# Patient Record
Sex: Female | Born: 1995 | Race: Black or African American | Hispanic: No | Marital: Single | Smoking: Former smoker
Health system: Southern US, Community
[De-identification: ages and names within clinical notes are randomized; demographics above are authoritative.]

## PROBLEM LIST (undated history)

## (undated) ENCOUNTER — Inpatient Hospital Stay (HOSPITAL_COMMUNITY): Payer: Self-pay

## (undated) DIAGNOSIS — N390 Urinary tract infection, site not specified: Secondary | ICD-10-CM

## (undated) DIAGNOSIS — A749 Chlamydial infection, unspecified: Secondary | ICD-10-CM

## (undated) DIAGNOSIS — R011 Cardiac murmur, unspecified: Secondary | ICD-10-CM

## (undated) DIAGNOSIS — F419 Anxiety disorder, unspecified: Secondary | ICD-10-CM

## (undated) DIAGNOSIS — I10 Essential (primary) hypertension: Secondary | ICD-10-CM

## (undated) DIAGNOSIS — B999 Unspecified infectious disease: Secondary | ICD-10-CM

## (undated) HISTORY — PX: ADENOIDECTOMY: SUR15

## (undated) HISTORY — DX: Cardiac murmur, unspecified: R01.1

## (undated) HISTORY — PX: TONSILLECTOMY: SUR1361

---
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
## 2015-01-27 ENCOUNTER — Ambulatory Visit (INDEPENDENT_AMBULATORY_CARE_PROVIDER_SITE_OTHER): Payer: BLUE CROSS/BLUE SHIELD | Admitting: Family Medicine

## 2015-01-27 VITALS — BP 114/68 | HR 100 | Temp 99.0°F | Resp 18 | Ht 61.5 in | Wt 153.0 lb

## 2015-01-27 DIAGNOSIS — B084 Enteroviral vesicular stomatitis with exanthem: Secondary | ICD-10-CM

## 2015-01-27 MED ORDER — MAGIC MOUTHWASH W/LIDOCAINE: 10.0000 mL | ORAL | Status: DC | PRN

## 2015-01-27 NOTE — Progress Notes (Signed)
Subjective:    Patient ID: Karen Booth, female    DOB: [birth year], 19 y.o.   MRN: [id number] This chart was scribed for Nilda Simmer, MD by Jolene Provost, Medical Scribe. This patient was seen in Room 8 and the patient's care was started at 1:36 PM.  Chief Complaint  Patient presents with  . Other    son had hands, foot, mouth disease and she's starting to feel the same symptoms    HPI HPI Comments: Karen Booth is a 19 y.o. female who presents to [facility] complaining of sores on her bilateral hands, feet, and throat. Pt also states she has one sore on her foot. Pt states her son recently had hand, foot and mouth disease and she believes she contracted it from him. Pt endorses associated fever and bilateral ear pain. Pt denies diarrhea, nausea, vomiting, or night sweats. Pt is taking tylenol regularly with some relief. Pt is allergic to tylenol and ibuprofen.   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  Tmax 101.5.  Pt's mother also has hand-foot-mouth at this time.  Nephew gave infection to son who passed onto patient and mother.  Pt does not have a PCP.    Review of Systems  Constitutional: Positive for fever and fatigue. Negative for chills and diaphoresis.  HENT: Positive for ear pain and sore throat. Negative for congestion, postnasal drip, rhinorrhea, trouble swallowing and voice change.   Respiratory: Negative for cough and shortness of breath.   Gastrointestinal: Negative for nausea, vomiting, abdominal pain and diarrhea.  Skin: Positive for color change and rash.  Neurological: Positive for headaches.   Past Medical History  Diagnosis Date  . Urinary tract infection   . Heart murmur    Past Medical History  Diagnosis Date  . Urinary tract infection   . Heart murmur    Allergies  Allergen Reactions  . Ibuprofen Anaphylaxis, Hives and Swelling    Oral swelling   History   Social History  . Marital Status: Single    Spouse Name: N/A  . Number of Children: N/A  . Years of  Education: N/A   Occupational History  . Not on file.   Social History Main Topics  . Smoking status: Former Games developer  . Smokeless tobacco: Never Used     Comment: summer 2014  . Alcohol Use: No  . Drug Use: No  . Sexual Activity: Not Currently    Birth Control/ Protection: None   Other Topics Concern  . Not on file   Social History Narrative       Objective:   Physical Exam  Constitutional: She is oriented to person, place, and time. She appears well-developed and well-nourished. No distress.  HENT:  Head: Normocephalic and atraumatic.  Right Ear: Tympanic membrane, external ear and ear canal normal.  Left Ear: Tympanic membrane, external ear and ear canal normal.  Nose: Nose normal. No mucosal edema or rhinorrhea.  Mouth/Throat: Oropharynx is clear and moist and mucous membranes are normal. Oral lesions present. No uvula swelling. No oropharyngeal exudate.    Scattured.   Eyes: Conjunctivae are normal. Pupils are equal, round, and reactive to light.  Neck: Normal range of motion. Neck supple.  Cardiovascular: Normal rate, regular rhythm and normal heart sounds.  Exam reveals no gallop and no friction rub.   No murmur heard. Pulmonary/Chest: Effort normal and breath sounds normal. No respiratory distress. She has no wheezes. She has no rales.  Musculoskeletal: Normal range of motion.  Lymphadenopathy:  She has no cervical adenopathy.  Neurological: She is alert and oriented to person, place, and time. Coordination normal.  Skin: Skin is warm and dry. Rash noted. She is not diaphoretic.  Macular papular rash on bilateral palms, and one lesion on foot.  No vesicles.  Psychiatric: She has a normal mood and affect. Her behavior is normal.  Nursing note and vitals reviewed.      Assessment & Plan:   1. Hand, foot and mouth disease    -new. -Supportive care recommended. -Rx for Magic Mouthwash provided for PRN use. -Continue Tylenol PRN. -Encourage hydration.  RTC  inability to swallow or drink. -OOW note unti 02/01/15.  Meds ordered this encounter  Medications  . Alum & Mag Hydroxide-Simeth (MAGIC MOUTHWASH W/LIDOCAINE) SOLN    Sig: Take 10 mLs by mouth every 2 (two) hours as needed for mouth pain.    Dispense:  360 mL    Refill:  0   I personally performed the services described in this documentation, which was scribed in my presence. The recorded information has been reviewed and considered.   Aviel Davalos Paulita Fujita, M.D. [facility] [address] [place], [place]  [address] [phone] phone [phone] fax

## 2015-01-27 NOTE — Patient Instructions (Signed)

## 2015-01-29 ENCOUNTER — Ambulatory Visit (INDEPENDENT_AMBULATORY_CARE_PROVIDER_SITE_OTHER): Payer: BLUE CROSS/BLUE SHIELD | Admitting: *Deleted

## 2015-01-29 VITALS — Wt 153.0 lb

## 2015-01-29 DIAGNOSIS — Z3042 Encounter for surveillance of injectable contraceptive: Secondary | ICD-10-CM | POA: Diagnosis not present

## 2015-01-29 MED ORDER — MEDROXYPROGESTERONE ACETATE 150 MG/ML IM SUSP
150.0000 mg | Freq: Once | INTRAMUSCULAR | Status: AC
  Administered 2015-01-29: 150 mg via INTRAMUSCULAR

## 2015-04-18 ENCOUNTER — Ambulatory Visit: Payer: Medicaid Other

## 2015-04-19 ENCOUNTER — Ambulatory Visit: Payer: Medicaid Other

## 2015-09-29 ENCOUNTER — Emergency Department (HOSPITAL_COMMUNITY)
Admission: EM | Admit: 2015-09-29 | Discharge: 2015-09-29 | Disposition: A | Discharge status: Home / Self Care | Payer: Medicaid Other | Attending: Emergency Medicine | Admitting: Emergency Medicine

## 2015-09-29 ENCOUNTER — Encounter (HOSPITAL_COMMUNITY): Payer: Self-pay | Admitting: Emergency Medicine

## 2015-09-29 ENCOUNTER — Emergency Department (HOSPITAL_COMMUNITY): Payer: Medicaid Other

## 2015-09-29 DIAGNOSIS — W231XXA Caught, crushed, jammed, or pinched between stationary objects, initial encounter: Secondary | ICD-10-CM | POA: Insufficient documentation

## 2015-09-29 DIAGNOSIS — R011 Cardiac murmur, unspecified: Secondary | ICD-10-CM | POA: Diagnosis not present

## 2015-09-29 DIAGNOSIS — S60011A Contusion of right thumb without damage to nail, initial encounter: Secondary | ICD-10-CM

## 2015-09-29 DIAGNOSIS — Z87440 Personal history of urinary (tract) infections: Secondary | ICD-10-CM | POA: Diagnosis not present

## 2015-09-29 DIAGNOSIS — Y92810 Car as the place of occurrence of the external cause: Secondary | ICD-10-CM | POA: Insufficient documentation

## 2015-09-29 DIAGNOSIS — Y998 Other external cause status: Secondary | ICD-10-CM | POA: Diagnosis not present

## 2015-09-29 DIAGNOSIS — Z87891 Personal history of nicotine dependence: Secondary | ICD-10-CM | POA: Insufficient documentation

## 2015-09-29 DIAGNOSIS — S6991XA Unspecified injury of right wrist, hand and finger(s), initial encounter: Secondary | ICD-10-CM | POA: Diagnosis present

## 2015-09-29 DIAGNOSIS — Y9389 Activity, other specified: Secondary | ICD-10-CM | POA: Diagnosis not present

## 2015-09-29 MED ORDER — ACETAMINOPHEN 325 MG PO TABS
650.0000 mg | ORAL_TABLET | Freq: Once | ORAL | Status: AC
  Administered 2015-09-29: 650 mg via ORAL
  Filled 2015-09-29: qty 2

## 2015-09-29 NOTE — ED Notes (Signed)
Ortho to come apply splint to patient thumb.

## 2015-09-29 NOTE — ED Notes (Signed)
Patient states that she slammed her right thumb into a car door about 30 minutes ago. Swelling noted to her right thumb

## 2015-09-29 NOTE — ED Provider Notes (Signed)
CSN: [id number]     Arrival date & time 09/29/15  2022 History  By signing my name below, I, Emmanuella Mensah, attest that this documentation has been prepared under the direction and in the presence of Elpidio Anis, PA-C. Electronically Signed: Angelene Giovanni, ED Scribe. 09/29/2015. 9:07 PM.    No chief complaint on file.  The history is provided by the patient. No language interpreter was used.   HPI Comments: Karen Booth is a 20 y.o. female who presents to the Emergency Department complaining of gradually worsening constant moderate right thumb pain s/p thumb injury that occurred 30 mins PTA. She reports associated swelling to the right thumb. She explains that she accidentally slammed her right thumb in the car door. No alleviating factors noted. Pt has not taken any medications PTA. She reports an allergy to Ibuprofen. No lacerations, fever, or chills.    Past Medical History  Diagnosis Date  . Urinary tract infection   . Heart murmur    Past Surgical History  Procedure Laterality Date  . Tonsillectomy     Family History  Problem Relation Age of Onset  . Heart disease Mother     murmur, arrythmia  . Hypertension Mother   . Hypertension Father   . Breast cancer Maternal Grandmother     and g-gma  . Breast cancer Paternal Grandmother   . Hearing loss Neg Hx   . Esophageal cancer Maternal Uncle   . Diabetes Paternal Aunt   . Heart murmur Mother   . Arrhythmia Mother   . Stomach cancer Maternal Uncle    Social History  Substance Use Topics  . Smoking status: Former Games developer  . Smokeless tobacco: Never Used     Comment: summer 2014  . Alcohol Use: No   OB History    Gravida Para Term Preterm AB TAB SAB Ectopic Multiple Living   Review of Systems  Constitutional: Negative for fever and chills.  Musculoskeletal: Positive for arthralgias (right thumb).  Skin: Negative for wound.      Allergies  Ibuprofen  Home Medications   Prior to  Admission medications   Medication Sig Start Date End Date Taking? Authorizing Provider  Alum & Mag Hydroxide-Simeth (MAGIC MOUTHWASH W/LIDOCAINE) SOLN Take 10 mLs by mouth every 2 (two) hours as needed for mouth pain. 01/27/15   Ethelda Chick, MD   There were no vitals taken for this visit. Physical Exam  Constitutional: She is oriented to person, place, and time. She appears well-developed and well-nourished.  HENT:  Head: Normocephalic and atraumatic.  Cardiovascular: Normal rate.   Pulmonary/Chest: Effort normal.  Abdominal: She exhibits no distension.  Musculoskeletal:  Right thumb swollen, mildly erythematous. No bony deformity. Moves all joints with discomfort. Nail intact.  Neurological: She is alert and oriented to person, place, and time.  Skin: Skin is warm and dry.  Psychiatric: She has a normal mood and affect.  Nursing note and vitals reviewed.   ED Course  Procedures (including critical care time) DIAGNOSTIC STUDIES: Oxygen Saturation is 100% on RA, normal by my interpretation.    COORDINATION OF CARE: 9:00 PM- Pt advised of plan for treatment and pt agrees. Pt will receive x-ray for further evaluation.   Imaging Review No results found.   Elpidio Anis, PA-C has personally reviewed and evaluated these images and lab results as part of her medical decision-making.  MDM   Final diagnoses:  None  1. Contusion thumb  Uncomplicated thumb injury. No tendon deficits, negative imaging.    I personally performed the services described in this documentation, which was scribed in my presence. The recorded information has been reviewed and is accurate.     Elpidio Anis, PA-C 09/29/15 2141  Raeford Razor, MD 10/04/15 [phone]

## 2015-09-29 NOTE — Discharge Instructions (Signed)
Cryotherapy °Cryotherapy means treatment with cold. Ice or gel packs can be used to reduce both pain and swelling. Ice is the most helpful within the first 24 to 48 hours after an injury or flare-up from overusing a muscle or joint. Sprains, strains, spasms, burning pain, shooting pain, and aches can all be eased with ice. Ice can also be used when recovering from surgery. Ice is effective, has very few side effects, and is safe for most people to use. °PRECAUTIONS  °Ice is not a safe treatment option for people with: °· Raynaud phenomenon. This is a condition affecting small blood vessels in the extremities. Exposure to cold may cause your problems to return. °· Cold hypersensitivity. There are many forms of cold hypersensitivity, including: °· Cold urticaria. Red, itchy hives appear on the skin when the tissues begin to warm after being iced. °· Cold erythema. This is a red, itchy rash caused by exposure to cold. °· Cold hemoglobinuria. Red blood cells break down when the tissues begin to warm after being iced. The hemoglobin that carry oxygen are passed into the urine because they cannot combine with blood proteins fast enough. °· Numbness or altered sensitivity in the area being iced. °If you have any of the following conditions, do not use ice until you have discussed cryotherapy with your caregiver: °· Heart conditions, such as arrhythmia, angina, or chronic heart disease. °· High blood pressure. °· Healing wounds or open skin in the area being iced. °· Current infections. °· Rheumatoid arthritis. °· Poor circulation. °· Diabetes. °Ice slows the blood flow in the region it is applied. This is beneficial when trying to stop inflamed tissues from spreading irritating chemicals to surrounding tissues. However, if you expose your skin to cold temperatures for too long or without the proper protection, you can damage your skin or nerves. Watch for signs of skin damage due to cold. °HOME CARE INSTRUCTIONS °Follow  these tips to use ice and cold packs safely. °· Place a dry or damp towel between the ice and skin. A damp towel will cool the skin more quickly, so you may need to shorten the time that the ice is used. °· For a more rapid response, add gentle compression to the ice. °· Ice for no more than 10 to 20 minutes at a time. The bonier the area you are icing, the less time it will take to get the benefits of ice. °· Check your skin after 5 minutes to make sure there are no signs of a poor response to cold or skin damage. °· Rest 20 minutes or more between uses. °· Once your skin is numb, you can end your treatment. You can test numbness by very lightly touching your skin. The touch should be so light that you do not see the skin dimple from the pressure of your fingertip. When using ice, most people will feel these normal sensations in this order: cold, burning, aching, and numbness. °· Do not use ice on someone who cannot communicate their responses to pain, such as small children or people with dementia. °HOW TO MAKE AN ICE PACK °Ice packs are the most common way to use ice therapy. Other methods include ice massage, ice baths, and cryosprays. Muscle creams that cause a cold, tingly feeling do not offer the same benefits that ice offers and should not be used as a substitute unless recommended by your caregiver. °To make an ice pack, do one of the following: °· Place crushed ice or a   bag of frozen vegetables in a sealable plastic bag. Squeeze out the excess air. Place this bag inside another plastic bag. Slide the bag into a pillowcase or place a damp towel between your skin and the bag. °· Mix 3 parts water with 1 part rubbing alcohol. Freeze the mixture in a sealable plastic bag. When you remove the mixture from the freezer, it will be slushy. Squeeze out the excess air. Place this bag inside another plastic bag. Slide the bag into a pillowcase or place a damp towel between your skin and the bag. °SEEK MEDICAL CARE  IF: °· You develop white spots on your skin. This may give the skin a blotchy (mottled) appearance. °· Your skin turns blue or pale. °· Your skin becomes waxy or hard. °· Your swelling gets worse. °MAKE SURE YOU:  °· Understand these instructions. °· Will watch your condition. °· Will get help right away if you are not doing well or get worse. °  °This information is not intended to replace advice given to you by your health care provider. Make sure you discuss any questions you have with your health care provider. °  °Document Released: 03/24/2011 Document Revised: 08/18/2014 Document Reviewed: 03/24/2011 °Elsevier Interactive Patient Education ©2016 [employer] ° °Contusion °A contusion is a deep bruise. Contusions are the result of a blunt injury to tissues and muscle fibers under the skin. The injury causes bleeding under the skin. The skin overlying the contusion may turn blue, purple, or yellow. Minor injuries will give you a painless contusion, but more severe contusions may stay painful and swollen for a few weeks.  °CAUSES  °This condition is usually caused by a blow, trauma, or direct force to an area of the body. °SYMPTOMS  °Symptoms of this condition include: °· Swelling of the injured area. °· Pain and tenderness in the injured area. °· Discoloration. The area may have redness and then turn blue, purple, or yellow. °DIAGNOSIS  °This condition is diagnosed based on a physical exam and medical history. An X-ray, CT scan, or MRI may be needed to determine if there are any associated injuries, such as broken bones (fractures). °TREATMENT  °Specific treatment for this condition depends on what area of the body was injured. In general, the best treatment for a contusion is resting, icing, applying pressure to (compression), and elevating the injured area. This is often called the RICE strategy. Over-the-counter anti-inflammatory medicines may also be recommended for pain control.  °HOME CARE INSTRUCTIONS    °· Rest the injured area. °· If directed, apply ice to the injured area: °¨ Put ice in a plastic bag. °¨ Place a towel between your skin and the bag. °¨ Leave the ice on for 20 minutes, 2-3 times per day. °· If directed, apply light compression to the injured area using an elastic bandage. Make sure the bandage is not wrapped too tightly. Remove and reapply the bandage as directed by your health care provider. °· If possible, raise (elevate) the injured area above the level of your heart while you are sitting or lying down. °· Take over-the-counter and prescription medicines only as told by your health care provider. °SEEK MEDICAL CARE IF: °· Your symptoms do not improve after several days of treatment. °· Your symptoms get worse. °· You have difficulty moving the injured area. °SEEK IMMEDIATE MEDICAL CARE IF:  °· You have severe pain. °· You have numbness in a hand or foot. °· Your hand or foot turns pale or cold. °  °  This information is not intended to replace advice given to you by your health care provider. Make sure you discuss any questions you have with your health care provider. °  °Document Released: 05/07/2005 Document Revised: 04/18/2015 Document Reviewed: 12/13/2014 °Elsevier Interactive Patient Education ©2016 [employer] Inc. ° °

## 2015-09-29 NOTE — ED Notes (Signed)
Patient d/c'd self care.  F/U reviewed.  Patient verbalized understanding. 

## 2016-09-25 ENCOUNTER — Encounter (HOSPITAL_COMMUNITY): Payer: Self-pay | Admitting: Emergency Medicine

## 2016-09-25 ENCOUNTER — Emergency Department (HOSPITAL_COMMUNITY)
Admission: EM | Admit: 2016-09-25 | Discharge: 2016-09-25 | Disposition: A | Discharge status: Home / Self Care | Payer: Medicaid Other | Attending: Emergency Medicine | Admitting: Emergency Medicine

## 2016-09-25 DIAGNOSIS — I10 Essential (primary) hypertension: Secondary | ICD-10-CM

## 2016-09-25 DIAGNOSIS — Z79899 Other long term (current) drug therapy: Secondary | ICD-10-CM | POA: Insufficient documentation

## 2016-09-25 DIAGNOSIS — Z87891 Personal history of nicotine dependence: Secondary | ICD-10-CM | POA: Diagnosis not present

## 2016-09-25 DIAGNOSIS — Z720 Tobacco use: Secondary | ICD-10-CM

## 2016-09-25 NOTE — ED Triage Notes (Signed)
Pt reports having high blood pressure for past few months but has been ignoring it. Pt checked BP this morning and noticed it was still elevated and was feeling tired. Denies chest pain or SOB. Pt adds on and off headaches.

## 2016-09-25 NOTE — ED Provider Notes (Signed)
[facility]-EMERGENCY DEPT Provider Note   CSN: [id number] Arrival date & time: 09/25/16  1221  By signing my name below, I, Karen Booth Andrew Long, attest that this documentation has been prepared under the direction and in the presence of [address]Alishba [place], VF CorporationPA-C. Electronically Signed: Marnette Booth Andrew Long, Scribe. 09/25/2016. 1:16 PM.  History   Chief Complaint Chief Complaint  Patient presents with  . Hypertension   The history is provided by the patient and medical records. No language interpreter was used.  Hypertension  This is a new problem. The current episode started more than 1 week ago. The problem occurs daily. The problem has not changed since onset.Pertinent negatives include no chest pain, no abdominal pain, no headaches and no shortness of breath. Nothing aggravates the symptoms. Nothing relieves the symptoms. She has tried nothing for the symptoms. The treatment provided no relief.    HPI Comments:  Karen Booth is a 21 y.o. female who presents to the Emergency Department complaining of concerns about her high blood pressure onset several months ago. She notes this has been going on for the past couple of months, she is in CNA classes and everytime they've checked it there it's been in the 130s/90s range; today she felt fatigued and when she checked her blood pressure it was 136/98. She has never been diagnosed with HTN or been seen by a PCP for it, but she has noticed it in class or at work. She has never been prescribed anything before, and did not take anything for relief of her symptoms. Pt notes intermittent headaches and mild visual blurriness in the past, but is not experiencing either of them today. No known aggravating factors. Admits to consuming sodas regularly. +Smoker. Pt denies ongoing vision changes/HA, lightheadedness, fevers, chills, CP, SOB, LE swelling, abd pain, N/V/D/C, hematuria, dysuria, myalgias, arthralgias, numbness, tingling, focal weakness, or any other complaints at this  time. She has a PCP but hasn't gone to them for this issue.     Past Medical History:  Diagnosis Date  . Heart murmur   . Urinary tract infection     Patient Active Problem List   Diagnosis Date Noted  . Vulvar edema 04/09/2014  . SVD (spontaneous vaginal delivery) 04/09/2014  . Active labor 04/06/2014  . Heart palpitations 03/01/2014  . Supervision of normal first teen pregnancy 10/11/2013    Past Surgical History:  Procedure Laterality Date  . TONSILLECTOMY      OB History    Gravida Para Term Preterm AB Living   1 1 1     1    SAB TAB Ectopic Multiple Live Births           1       Home Medications    Prior to Admission medications   Medication Sig Start Date End Date Taking? Authorizing Provider  Alum & Mag Hydroxide-Simeth (MAGIC MOUTHWASH W/LIDOCAINE) SOLN Take 10 mLs by mouth every 2 (two) hours as needed for mouth pain. 01/27/15   Ethelda ChickKristi M Smith, MD    Family History Family History  Problem Relation Age of Onset  . Heart disease Mother     murmur, arrythmia  . Hypertension Mother   . Heart murmur Mother   . Arrhythmia Mother   . Hypertension Father   . Breast cancer Maternal Grandmother     and g-gma  . Breast cancer Paternal Grandmother   . Esophageal cancer Maternal Uncle   . Diabetes Paternal Aunt   . Stomach cancer Maternal Uncle   .  Hearing loss Neg Hx     Social History Social History  Substance Use Topics  . Smoking status: Former Games developer  . Smokeless tobacco: Never Used     Comment: summer 2014  . Alcohol use No     Allergies   Ibuprofen   Review of Systems Review of Systems  Constitutional: Negative for chills and fever.       +HTN  Eyes: Negative for visual disturbance.  Respiratory: Negative for shortness of breath.   Cardiovascular: Negative for chest pain and leg swelling.  Gastrointestinal: Negative for abdominal pain, constipation, diarrhea, nausea and vomiting.  Genitourinary: Negative for dysuria and hematuria.    Musculoskeletal: Negative for arthralgias and myalgias.  Skin: Negative for color change.  Allergic/Immunologic: Negative for immunocompromised state.  Neurological: Negative for weakness, light-headedness, numbness and headaches.  Psychiatric/Behavioral: Negative for confusion.   10 Systems reviewed and are negative for acute change except as noted in the HPI.  Physical Exam Updated Vital Signs BP (!) 130/106 (BP Location: Left Arm)   Pulse 72   Temp 98.9 F (37.2 C) (Oral)   Resp 16   Ht 5' 1.5" (1.562 m)   Wt 179 lb 1.6 oz (81.2 kg)   SpO2 100%   BMI 33.29 kg/m   Physical Exam  Constitutional: She is oriented to person, place, and time. Vital signs are normal. She appears well-developed and well-nourished.  Non-toxic appearance. No distress.  Afebrile, nontoxic, NAD, overweight. BP 130/106  HENT:  Head: Normocephalic and atraumatic.  Mouth/Throat: Oropharynx is clear and moist and mucous membranes are normal.  Eyes: Conjunctivae and EOM are normal. Right eye exhibits no discharge. Left eye exhibits no discharge.  Neck: Normal range of motion. Neck supple.  Cardiovascular: Normal rate, regular rhythm, normal heart sounds and intact distal pulses.  Exam reveals no gallop and no friction rub.   No murmur heard. Pulmonary/Chest: Effort normal and breath sounds normal. No respiratory distress. She has no decreased breath sounds. She has no wheezes. She has no rhonchi. She has no rales.  Abdominal: Soft. Normal appearance and bowel sounds are normal. She exhibits no distension. There is no tenderness. There is no rigidity, no rebound, no guarding, no CVA tenderness, no tenderness at McBurney's point and negative Murphy's sign.  Musculoskeletal: Normal range of motion.  MAE x4 Strength and sensation grossly intact in all extremities Distal pulses intact Gait steady No pedal edema  Neurological: She is alert and oriented to person, place, and time. She has normal strength. No  sensory deficit.  Skin: Skin is warm, dry and intact. No rash noted.  Psychiatric: She has a normal mood and affect.  Nursing note and vitals reviewed.    ED Treatments / Results  DIAGNOSTIC STUDIES:  Oxygen Saturation is 100% on RA, normal by my interpretation.    COORDINATION OF CARE:  1:06 PM Pt advised to stop smoking, cutting out caffeine supplements including soda, and exercising. Discussed treatment plan with pt at bedside and pt agreed to plan. Pt advised to f/u with PCP in 2 or 3 weeks to establish blood pressure baseline.   Labs (all labs ordered are listed, but only abnormal results are displayed) Labs Reviewed - No data to display  EKG  EKG Interpretation None       Radiology No results found.  Procedures Procedures (including critical care time)  Medications Ordered in ED Medications - No data to display   Initial Impression / Assessment and Plan / ED Course  I have  reviewed the triage vital signs and the nursing notes.  Pertinent labs & imaging results that were available during my care of the patient were reviewed by me and considered in my medical decision making (see chart for details).     21 y.o. female here with concern about her HTN, has checked it at home/work a few times and it's been in the 130s/90s range. Pt slightly overweight, and a smoker. Consumes some caffeine products. BP today 130/106, completely asymptomatic at this time. Doubt need for emergent work up. Long discussion had regarding diet/exercise and lifestyle changes, DASH diet, and smoking cessation, and I feel she would likely greatly benefit from this alone; doubt she would need any meds if she really starts to making diet/life changes. Encouraged pt to keep a log of BPs for her PCP's office, and f/up with them in 2-3 weeks for recheck and ongoing management. Would consider trial of 6 months to make diet/life changes before considering medicines, but will defer to PCP's judgement on  this. I explained the diagnosis and have given explicit precautions to return to the ER including for any other new or worsening symptoms. The patient understands and accepts the medical plan as it's been dictated and I have answered their questions. Discharge instructions concerning home care and prescriptions have been given. The patient is STABLE and is discharged to home in good condition.   I personally performed the services described in this documentation, which was scribed in my presence. The recorded information has been reviewed and is accurate.   Final Clinical Impressions(s) / ED Diagnoses   Final diagnoses:  Hypertension, unspecified type  Tobacco user    New Prescriptions New Prescriptions   No medications on file     [address] Deeksha Cotrell, PA-C 09/25/16 1321    Tilden Fossa, MD 09/26/16 [phone]

## 2016-09-25 NOTE — Discharge Instructions (Signed)
STOP SMOKING! Eat a low-salt diet. Avoid caffeine intake. Try to exercise more, walking 30 minutes per day 3x/week is a great place to start. Try to lose weight. Follow up with your primary care doctor in 2-3 weeks to recheck symptoms and for ongoing management of your blood pressure. Try to keep a log of your blood pressure readings if possible, to take to your doctor's visit. Return to the ER for emergent changes or worsening symptoms.

## 2016-11-12 ENCOUNTER — Emergency Department (HOSPITAL_COMMUNITY): Payer: Medicaid Other

## 2016-11-12 ENCOUNTER — Emergency Department (HOSPITAL_COMMUNITY)
Admission: EM | Admit: 2016-11-12 | Discharge: 2016-11-13 | Disposition: A | Discharge status: Home / Self Care | Payer: Medicaid Other | Attending: Emergency Medicine | Admitting: Emergency Medicine

## 2016-11-12 ENCOUNTER — Encounter (HOSPITAL_COMMUNITY): Payer: Self-pay

## 2016-11-12 DIAGNOSIS — N390 Urinary tract infection, site not specified: Secondary | ICD-10-CM | POA: Diagnosis not present

## 2016-11-12 DIAGNOSIS — J111 Influenza due to unidentified influenza virus with other respiratory manifestations: Secondary | ICD-10-CM

## 2016-11-12 DIAGNOSIS — R509 Fever, unspecified: Secondary | ICD-10-CM | POA: Diagnosis present

## 2016-11-12 DIAGNOSIS — Z87891 Personal history of nicotine dependence: Secondary | ICD-10-CM | POA: Diagnosis not present

## 2016-11-12 DIAGNOSIS — J069 Acute upper respiratory infection, unspecified: Secondary | ICD-10-CM

## 2016-11-12 DIAGNOSIS — R69 Illness, unspecified: Secondary | ICD-10-CM

## 2016-11-12 LAB — CBC
HCT: 41.8 % (ref 36.0–46.0)
Hemoglobin: 14 g/dL (ref 12.0–15.0)
MCH: 27 pg (ref 26.0–34.0)
MCHC: 33.5 g/dL (ref 30.0–36.0)
MCV: 80.7 fL (ref 78.0–100.0)
Platelets: 201 10*3/uL (ref 150–400)
RBC: 5.18 MIL/uL — ABNORMAL HIGH (ref 3.87–5.11)
RDW: 14.3 % (ref 11.5–15.5)
WBC: 9.6 10*3/uL (ref 4.0–10.5)

## 2016-11-12 LAB — COMPREHENSIVE METABOLIC PANEL
ALBUMIN: 4.6 g/dL (ref 3.5–5.0)
ALK PHOS: 113 U/L (ref 38–126)
ALT: 22 U/L (ref 14–54)
AST: 21 U/L (ref 15–41)
Anion gap: 8 (ref 5–15)
BUN: 9 mg/dL (ref 6–20)
CO2: 26 mmol/L (ref 22–32)
Calcium: 9.3 mg/dL (ref 8.9–10.3)
Chloride: 103 mmol/L (ref 101–111)
Creatinine, Ser: 0.93 mg/dL (ref 0.44–1.00)
GFR calc Af Amer: >60 mL/min (ref >60)
GFR calc non Af Amer: >60 mL/min (ref >60)
Glucose, Bld: 89 mg/dL (ref 65–99)
POTASSIUM: 3.5 mmol/L (ref 3.5–5.1)
Sodium: 137 mmol/L (ref 135–145)
Total Bilirubin: 0.6 mg/dL (ref 0.3–1.2)
Total Protein: 8.1 g/dL (ref 6.5–8.1)

## 2016-11-12 LAB — URINALYSIS, ROUTINE W REFLEX MICROSCOPIC
Bilirubin Urine: NEGATIVE
Glucose, UA: NEGATIVE mg/dL
Ketones, ur: NEGATIVE mg/dL
NITRITE: NEGATIVE
Protein, ur: 30 mg/dL — AB
SPECIFIC GRAVITY, URINE: 1.02 (ref 1.005–1.030)
pH: 5 (ref 5.0–8.0)

## 2016-11-12 LAB — RAPID STREP SCREEN (MED CTR MEBANE ONLY): STREPTOCOCCUS, GROUP A SCREEN (DIRECT): NEGATIVE

## 2016-11-12 LAB — I-STAT BETA HCG BLOOD, ED (MC, WL, AP ONLY): I-stat hCG, quantitative: <5 m[IU]/mL (ref <5)

## 2016-11-12 LAB — LIPASE, BLOOD: Lipase: 19 U/L (ref 11–51)

## 2016-11-12 MED ORDER — ACETAMINOPHEN 325 MG PO TABS
650.0000 mg | ORAL_TABLET | Freq: Once | ORAL | Status: AC | PRN
  Administered 2016-11-12: 650 mg via ORAL
  Filled 2016-11-12: qty 2

## 2016-11-12 NOTE — ED Triage Notes (Addendum)
PT C/O FEVER, SORE THROAT, PRODUCTIVE COUGH W/GREEN SPUTUM, RUNNY NOSE, BODY ACHES, N/V, AND CLOGGED EARS X3 DAYS.

## 2016-11-13 MED ORDER — CETIRIZINE HCL 10 MG PO TABS: 10.0000 mg | ORAL_TABLET | Freq: Every day | ORAL | 1 refills | Status: DC

## 2016-11-13 MED ORDER — CEPHALEXIN 500 MG PO CAPS: 500.0000 mg | ORAL_CAPSULE | Freq: Four times a day (QID) | ORAL | 0 refills | Status: DC

## 2016-11-13 MED ORDER — FLUTICASONE PROPIONATE 50 MCG/ACT NA SUSP: 2.0000 | Freq: Every day | NASAL | 2 refills | Status: DC

## 2016-11-13 MED ORDER — CEPHALEXIN 500 MG PO CAPS
500.0000 mg | ORAL_CAPSULE | Freq: Once | ORAL | Status: AC
  Administered 2016-11-13: 500 mg via ORAL
  Filled 2016-11-13: qty 1

## 2016-11-13 MED ORDER — OSELTAMIVIR PHOSPHATE 75 MG PO CAPS: 75.0000 mg | ORAL_CAPSULE | Freq: Two times a day (BID) | ORAL | 0 refills | Status: DC

## 2016-11-13 NOTE — Discharge Instructions (Signed)
1. Medications: Keflex, zyrtec, flonase, tamiflu, usual home medications 2. Treatment: rest, drink plenty of fluids, take medications as prescribed 3. Follow Up: Please followup with your primary doctor in 3 days for discussion of your diagnoses and further evaluation after today's visit; if you do not have a primary care doctor use the resource guide provided to find one; return to the ER for fevers, persistent vomiting, worsening abdominal pain or other concerning symptoms.

## 2016-11-13 NOTE — ED Provider Notes (Signed)
[facility]-EMERGENCY DEPT Provider Note   CSN: [id number] Arrival date & time: 11/12/16  2209   By signing my name below, I, Karen Booth, attest that this documentation has been prepared under the direction and in the presence of [employer], PA-C. Electronically Signed: Freida Booth, Scribe. 11/13/2016. 12:24 AM.   History   Chief Complaint Chief Complaint  Patient presents with  . Fever     The history is provided by the patient. No language interpreter was used.    HPI Comments:  Karen Booth is a 21 y.o. female who presents to the Emergency Department complaining of a fever x 3 days with TMAX of 103.6. She reports associated generalized body aches, cough, rhinorrhea, congestion, and 1 episode of vomiting today; no blood in her emesis.  She also notes dysuria, and lower back pain x ~ 3 days. She denies diarrhea, abdominal pain, and rash. She also denies recent sick contacts. No recent travel outside of the country, No recent antibiotics. No tick bites. Pt received a flu shot this season.  No alleviating factors noted; no treatments tried PTA.   Past Medical History:  Diagnosis Date  . Heart murmur   . Urinary tract infection     Patient Active Problem List   Diagnosis Date Noted  . Vulvar edema 04/09/2014  . SVD (spontaneous vaginal delivery) 04/09/2014  . Active labor 04/06/2014  . Heart palpitations 03/01/2014  . Supervision of normal first teen pregnancy 10/11/2013    Past Surgical History:  Procedure Laterality Date  . TONSILLECTOMY      OB History    Gravida Para Term Preterm AB Living   SAB TAB Ectopic Multiple Live Births           1       Home Medications    Prior to Admission medications   Medication Sig Start Date End Date Taking? Authorizing Provider  Alum & Mag Hydroxide-Simeth (MAGIC MOUTHWASH W/LIDOCAINE) SOLN Take 10 mLs by mouth every 2 (two) hours as needed for mouth pain. 01/27/15   Ethelda Chick, MD  cephALEXin (KEFLEX)  500 MG capsule Take 1 capsule (500 mg total) by mouth 4 (four) times daily. 11/13/16   Odyn Turko, PA-C  cetirizine (ZYRTEC ALLERGY) 10 MG tablet Take 1 tablet (10 mg total) by mouth daily. 11/13/16   Kenwood Rosiak, PA-C  fluticasone (FLONASE) 50 MCG/ACT nasal spray Place 2 sprays into both nostrils daily. 11/13/16   Raguel Kosloski, PA-C  oseltamivir (TAMIFLU) 75 MG capsule Take 1 capsule (75 mg total) by mouth every 12 (twelve) hours. 11/13/16   Dahlia Client Karee Christopherson, PA-C    Family History Family History  Problem Relation Age of Onset  . Heart disease Mother     murmur, arrythmia  . Hypertension Mother   . Heart murmur Mother   . Arrhythmia Mother   . Hypertension Father   . Breast cancer Maternal Grandmother     and g-gma  . Breast cancer Paternal Grandmother   . Esophageal cancer Maternal Uncle   . Diabetes Paternal Aunt   . Stomach cancer Maternal Uncle   . Hearing loss Neg Hx     Social History Social History  Substance Use Topics  . Smoking status: Former Games developer  . Smokeless tobacco: Never Used     Comment: summer 2014  . Alcohol use No     Allergies   Ibuprofen   Review of Systems Review of Systems  Constitutional: Positive  for fever.  HENT: Positive for congestion and rhinorrhea.   Respiratory: Positive for cough.   Gastrointestinal: Positive for vomiting. Negative for diarrhea.  Genitourinary: Positive for dysuria.  Musculoskeletal: Positive for back pain and myalgias.  All other systems reviewed and are negative.    Physical Exam Updated Vital Signs BP 131/84 (BP Location: Right Arm)   Pulse 98   Temp 99.8 F (37.7 C) (Oral)   Resp 16   Ht  (1.549 m)   Wt 175 lb (79.4 kg)   LMP  (LMP Unknown) Comment: injection  SpO2 99%   BMI 33.07 kg/m   Physical Exam  Constitutional: She appears well-developed and well-nourished. No distress.  Awake, alert, nontoxic appearance  HENT:  Head: Normocephalic and atraumatic.  Right Ear:  Tympanic membrane, external ear and ear canal normal.  Left Ear: Tympanic membrane, external ear and ear canal normal.  Nose: Mucosal edema and rhinorrhea present. No epistaxis. Right sinus exhibits no maxillary sinus tenderness and no frontal sinus tenderness. Left sinus exhibits no maxillary sinus tenderness and no frontal sinus tenderness.  Mouth/Throat: Uvula is midline, oropharynx is clear and moist and mucous membranes are normal. Mucous membranes are not pale and not cyanotic. No oropharyngeal exudate, posterior oropharyngeal edema, posterior oropharyngeal erythema or tonsillar abscesses.  Eyes: Conjunctivae are normal. Pupils are equal, round, and reactive to light. No scleral icterus.  Neck: Normal range of motion and full passive range of motion without pain. Neck supple.  Cardiovascular: Normal rate, regular rhythm and intact distal pulses.   Pulmonary/Chest: Effort normal and breath sounds normal. No stridor. No respiratory distress. She has no wheezes.  Equal chest expansion  Abdominal: Soft. Bowel sounds are normal. She exhibits no mass. There is no tenderness. There is no rebound and no guarding.  Musculoskeletal: Normal range of motion. She exhibits no edema.  Lymphadenopathy:    She has no cervical adenopathy.  Neurological: She is alert.  Speech is clear and goal oriented Moves extremities without ataxia  Skin: Skin is warm and dry. No rash noted. She is not diaphoretic.  Psychiatric: She has a normal mood and affect.  Nursing note and vitals reviewed.    ED Treatments / Results  DIAGNOSTIC STUDIES:  Oxygen Saturation is 99% on RA, normal by my interpretation.    COORDINATION OF CARE:  12:24 AM Discussed treatment plan with pt at bedside and pt agreed to plan. Pt tachycardic on arrival to ED based on triage vitals but not tachycardic on clinical exam.   Labs (all labs ordered are listed, but only abnormal results are displayed) Labs Reviewed  CBC - Abnormal;  Notable for the following:       Result Value   RBC 5.18 (*)    All other components within normal limits  URINALYSIS, ROUTINE W REFLEX MICROSCOPIC - Abnormal; Notable for the following:    APPearance CLOUDY (*)    Hgb urine dipstick LARGE (*)    Protein, ur 30 (*)    Leukocytes, UA MODERATE (*)    Bacteria, UA RARE (*)    Squamous Epithelial / LPF 6-30 (*)    All other components within normal limits  RAPID STREP SCREEN (NOT AT [facility])  CULTURE, GROUP A STREP ([facility])  LIPASE, BLOOD  COMPREHENSIVE METABOLIC PANEL  I-STAT BETA HCG BLOOD, ED ([facility], [facility], [facility] ONLY)    Radiology Dg Chest 2 View  Result Date: 11/12/2016 CLINICAL DATA:  Nonproductive cough and congestion EXAM: CHEST  2 VIEW COMPARISON:  None. FINDINGS:  The heart size and mediastinal contours are within normal limits. Both lungs are clear. The visualized skeletal structures are unremarkable. IMPRESSION: No active cardiopulmonary disease. Electronically Signed   By: Jasmine Pang M.D.   On: 11/12/2016 23:19    Procedures Procedures (including critical care time)  Medications Ordered in ED Medications  acetaminophen (TYLENOL) tablet 650 mg (650 mg Oral Given 11/12/16 2237)  cephALEXin (KEFLEX) capsule 500 mg (500 mg Oral Given 11/13/16 0044)     Initial Impression / Assessment and Plan / ED Course  I have reviewed the triage vital signs and the nursing notes.  Pertinent labs & imaging results that were available during my care of the patient were reviewed by me and considered in my medical decision making (see chart for details).     Patient presents with influenza-like illness. Chest x-ray is without evidence of pneumonia. Labs are reassuring. Discussed likelihood of influenza versus viral URI. Offered Tamiflu and discussed risks and benefits. We'll also give treatment for seasonal allergies.  Patient also with symptoms of dysuria and low back pain. Urinalysis with evidence of UTI. Patient is without flank pain or intractable  vomiting. She is tolerating by mouth in the department at this time. Will treat with Keflex.  Pregnancy test negative. Discussed reasons to return to the emergency department including persistent vomiting, worsening fevers or other concerns.  Patient and mother state understanding and are in agreement with the plan.  Final Clinical Impressions(s) / ED Diagnoses   Final diagnoses:  Fever, unspecified fever cause  Upper respiratory tract infection, unspecified type  Urinary tract infection without hematuria, site unspecified  Influenza-like illness    New Prescriptions Discharge Medication List as of 11/13/2016 12:57 AM    START taking these medications   Details  cephALEXin (KEFLEX) 500 MG capsule Take 1 capsule (500 mg total) by mouth 4 (four) times daily., Starting Thu 11/13/2016, Print    cetirizine (ZYRTEC ALLERGY) 10 MG tablet Take 1 tablet (10 mg total) by mouth daily., Starting Thu 11/13/2016, Print    fluticasone (FLONASE) 50 MCG/ACT nasal spray Place 2 sprays into both nostrils daily., Starting Thu 11/13/2016, Print    oseltamivir (TAMIFLU) 75 MG capsule Take 1 capsule (75 mg total) by mouth every 12 (twelve) hours., Starting Thu 11/13/2016, Print       I personally performed the services described in this documentation, which was scribed in my presence. The recorded information has been reviewed and is accurate.     Dahlia Client Shaleen Talamantez, PA-C 11/13/16 1610    Paula Libra, MD 11/13/16 0700

## 2016-11-15 LAB — CULTURE, GROUP A STREP (THRC)

## 2017-09-12 ENCOUNTER — Encounter (HOSPITAL_COMMUNITY): Payer: Self-pay | Admitting: Nurse Practitioner

## 2017-09-12 ENCOUNTER — Emergency Department (HOSPITAL_COMMUNITY)
Admission: EM | Admit: 2017-09-12 | Discharge: 2017-09-12 | Disposition: A | Discharge status: Home / Self Care | Payer: Self-pay | Attending: Emergency Medicine | Admitting: Emergency Medicine

## 2017-09-12 DIAGNOSIS — R112 Nausea with vomiting, unspecified: Secondary | ICD-10-CM | POA: Insufficient documentation

## 2017-09-12 DIAGNOSIS — Z79899 Other long term (current) drug therapy: Secondary | ICD-10-CM | POA: Insufficient documentation

## 2017-09-12 DIAGNOSIS — Z87891 Personal history of nicotine dependence: Secondary | ICD-10-CM | POA: Insufficient documentation

## 2017-09-12 DIAGNOSIS — R109 Unspecified abdominal pain: Secondary | ICD-10-CM | POA: Insufficient documentation

## 2017-09-12 LAB — COMPREHENSIVE METABOLIC PANEL
ALBUMIN: 5 g/dL (ref 3.5–5.0)
ALK PHOS: 109 U/L (ref 38–126)
ALT: 39 U/L (ref 14–54)
AST: 34 U/L (ref 15–41)
Anion gap: 10 (ref 5–15)
BUN: 17 mg/dL (ref 6–20)
CALCIUM: 9.8 mg/dL (ref 8.9–10.3)
CO2: 23 mmol/L (ref 22–32)
Chloride: 107 mmol/L (ref 101–111)
Creatinine, Ser: 0.8 mg/dL (ref 0.44–1.00)
GFR calc Af Amer: >60 mL/min (ref >60)
GFR calc non Af Amer: >60 mL/min (ref >60)
GLUCOSE: 100 mg/dL — AB (ref 65–99)
POTASSIUM: 3.9 mmol/L (ref 3.5–5.1)
Sodium: 140 mmol/L (ref 135–145)
Total Bilirubin: 0.9 mg/dL (ref 0.3–1.2)
Total Protein: 8.9 g/dL — ABNORMAL HIGH (ref 6.5–8.1)

## 2017-09-12 LAB — URINALYSIS, ROUTINE W REFLEX MICROSCOPIC
Bilirubin Urine: NEGATIVE
Glucose, UA: NEGATIVE mg/dL
Ketones, ur: NEGATIVE mg/dL
Leukocytes, UA: NEGATIVE
NITRITE: NEGATIVE
Protein, ur: NEGATIVE mg/dL
SPECIFIC GRAVITY, URINE: 1.024 (ref 1.005–1.030)
pH: 5 (ref 5.0–8.0)

## 2017-09-12 LAB — CBC
HEMATOCRIT: 42.3 % (ref 36.0–46.0)
Hemoglobin: 14.7 g/dL (ref 12.0–15.0)
MCH: 28 pg (ref 26.0–34.0)
MCHC: 34.8 g/dL (ref 30.0–36.0)
MCV: 80.6 fL (ref 78.0–100.0)
Platelets: 211 10*3/uL (ref 150–400)
RBC: 5.25 MIL/uL — ABNORMAL HIGH (ref 3.87–5.11)
RDW: 14.5 % (ref 11.5–15.5)
WBC: 10.1 10*3/uL (ref 4.0–10.5)

## 2017-09-12 LAB — LIPASE, BLOOD: Lipase: 22 U/L (ref 11–51)

## 2017-09-12 LAB — I-STAT BETA HCG BLOOD, ED (MC, WL, AP ONLY)

## 2017-09-12 MED ORDER — SODIUM CHLORIDE 0.9 % IV BOLUS (SEPSIS)
1000.0000 mL | Freq: Once | INTRAVENOUS | Status: AC
  Administered 2017-09-12: 1000 mL via INTRAVENOUS

## 2017-09-12 MED ORDER — ONDANSETRON HCL 4 MG/2ML IJ SOLN
4.0000 mg | Freq: Once | INTRAMUSCULAR | Status: AC
  Administered 2017-09-12: 4 mg via INTRAVENOUS
  Filled 2017-09-12: qty 2

## 2017-09-12 MED ORDER — ONDANSETRON 4 MG PO TBDP: 4.0000 mg | ORAL_TABLET | Freq: Three times a day (TID) | ORAL | 0 refills | Status: DC | PRN

## 2017-09-12 NOTE — Discharge Instructions (Signed)
I suspect your symptoms are from a virus.   Stay well hydrated, use zofran for nausea.   Your urine had blood in it but no other signs of infection. We will call you if it grows bacteria in 2-3 days. Follow up with your primary care doctor if you have burning with urinary, blood in urine, lower abdominal pain.

## 2017-09-12 NOTE — ED Provider Notes (Signed)
[facility] DE[facility] Provider Note   CSN: [id number] Arrival date & time: 09/12/17  1515     History   Chief Complaint Chief Complaint  Patient presents with  . Abdominal Pain  . N/V    HPI Karen Booth is a 22 y.o. female for evaluation of nausea, vomiting, intermittent crampy abdominal pain onset this morning. Symptoms were sudden. Abdominal pain is mild, worsens right before she feels that she is going to throw up. She denies fevers, chills, chest pain, dysuria, hematuria, abnormal vaginal discharge or bleeding, diarrhea or constipation. Last menstrual period was one month ago. 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 Has not been able to eat or drink anything today due to nausea and vomiting. No history of abdominal surgeries or GI medical problems.  HPI  Past Medical History:  Diagnosis Date  . Heart murmur   . Urinary tract infection     Patient Active Problem List   Diagnosis Date Noted  . Vulvar edema 04/09/2014  . SVD (spontaneous vaginal delivery) 04/09/2014  . Active labor 04/06/2014  . Heart palpitations 03/01/2014  . Supervision of normal first teen pregnancy 10/11/2013    Past Surgical History:  Procedure Laterality Date  . TONSILLECTOMY      OB History    Gravida Para Term Preterm AB Living   1 1 1     1    SAB TAB Ectopic Multiple Live Births           1       Home Medications    Prior to Admission medications   Medication Sig Start Date End Date Taking? Authorizing Provider  Alum & Mag Hydroxide-Simeth (MAGIC MOUTHWASH W/LIDOCAINE) SOLN Take 10 mLs by mouth every 2 (two) hours as needed for mouth pain. 01/27/15   Ethelda Chick, MD  cephALEXin (KEFLEX) 500 MG capsule Take 1 capsule (500 mg total) by mouth 4 (four) times daily. 11/13/16   Muthersbaugh, Dahlia Client, PA-C  cetirizine (ZYRTEC ALLERGY) 10 MG  tablet Take 1 tablet (10 mg total) by mouth daily. 11/13/16   Muthersbaugh, Dahlia Client, PA-C  fluticasone (FLONASE) 50 MCG/ACT nasal spray Place 2 sprays into both nostrils daily. 11/13/16   Muthersbaugh, Dahlia Client, PA-C  ondansetron (ZOFRAN ODT) 4 MG disintegrating tablet Take 1 tablet (4 mg total) by mouth every 8 (eight) hours as needed for nausea or vomiting. 09/12/17   Liberty Handy, PA-C  oseltamivir (TAMIFLU) 75 MG capsule Take 1 capsule (75 mg total) by mouth every 12 (twelve) hours. 11/13/16   Muthersbaugh, Dahlia Client, PA-C    Family History Family History  Problem Relation Age of Onset  . Heart disease Mother        murmur, arrythmia  . Hypertension Mother   . Heart murmur Mother   . Arrhythmia Mother   . Hypertension Father   . Breast cancer Maternal Grandmother        and g-gma  . Breast cancer Paternal Grandmother   . Esophageal cancer Maternal Uncle   . Diabetes Paternal Aunt   . Stomach cancer Maternal Uncle   . Hearing loss Neg Hx     Social History Social History   Tobacco Use  . Smoking status: Former Games developer  . Smokeless tobacco: Never Used  . Tobacco comment: summer 2014  Substance Use Topics  . Alcohol use: No  . Drug use: No  Allergies   Ibuprofen   Review of Systems Review of Systems  Gastrointestinal: Positive for abdominal pain, nausea and vomiting.  All other systems reviewed and are negative.    Physical Exam Updated Vital Signs BP 117/83 (BP Location: Left Arm)   Pulse 87   Temp 99.2 F (37.3 C) (Oral)   Resp 20   LMP 08/12/2017 (LMP Unknown)   SpO2 99%   Physical Exam  Constitutional: She is oriented to person, place, and time. She appears well-developed and well-nourished. No distress.  NAD.  HENT:  Head: Normocephalic and atraumatic.  Right Ear: External ear normal.  Left Ear: External ear normal.  Nose: Nose normal.  Moist mucous membranes  Eyes: Conjunctivae and EOM are normal. No scleral icterus.  Neck: Normal range of motion.  Neck supple.  Cardiovascular: Normal rate, regular rhythm and normal heart sounds.  No murmur heard. Pulmonary/Chest: Effort normal and breath sounds normal. She has no wheezes.  Abdominal: Soft. Normal appearance and bowel sounds are normal. There is no tenderness.  No suprapubic or CVA tenderness. No guarding, rigidity, rebound.  Musculoskeletal: Normal range of motion. She exhibits no deformity.  Neurological: She is alert and oriented to person, place, and time.  Skin: Skin is warm and dry. Capillary refill takes less than 2 seconds.  Psychiatric: She has a normal mood and affect. Her behavior is normal. Judgment and thought content normal.  Nursing note and vitals reviewed.    ED Treatments / Results  Labs (all labs ordered are listed, but only abnormal results are displayed) Labs Reviewed  COMPREHENSIVE METABOLIC PANEL - Abnormal; Notable for the following components:      Result Value   Glucose, Bld 100 (*)    Total Protein 8.9 (*)    All other components within normal limits  CBC - Abnormal; Notable for the following components:   RBC 5.25 (*)    All other components within normal limits  URINALYSIS, ROUTINE W REFLEX MICROSCOPIC - Abnormal; Notable for the following components:   APPearance HAZY (*)    Hgb urine dipstick MODERATE (*)    Bacteria, UA FEW (*)    Squamous Epithelial / LPF 6-30 (*)    All other components within normal limits  LIPASE, BLOOD  I-STAT BETA HCG BLOOD, ED ([facility], [facility], [facility] ONLY)    EKG  EKG Interpretation None       Radiology No results found.  Procedures Procedures (including critical care time)  Medications Ordered in ED Medications  ondansetron (ZOFRAN) injection 4 mg (4 mg Intravenous Given 09/12/17 1736)  sodium chloride 0.9 % bolus 1,000 mL (1,000 mLs Intravenous New Bag/Given 09/12/17 1736)     Initial Impression / Assessment and Plan / ED Course  I have reviewed the triage vital signs and the nursing notes.  Pertinent labs &  imaging results that were available during my care of the patient were reviewed by me and considered in my medical decision making (see chart for details).  Clinical Course as of Sep 12 1850  Sat Sep 12, 2017  1839 Reevaluated patients. Nausea, vomiting, abdominal pain have resolved. She is tolerating fluid challenge. Repeat abdominal exam benign.  [CG]    Clinical Course User Index [CG] Liberty HandyGibbons, Kinzi Frediani J, PA-C  High suspicion for viral gastroenteritis given recent contacts, sudden onset of symptoms. She has no abdominal tenderness on exam. She is otherwise healthy without history of GI problems. Doubt cholecystitis, appendicitis, SBO, diverticulitis. She has no urinary or vaginal symptoms.  Lab work  is reassuring. Patient given IV fluids and Zofran, she tolerated fluid challenge. Repeat abdominal exam is benign she has no tenderness. Patient states she feels much better after nausea medicine and fluids. We'll discharge with Zofran, gentle diet. Her urine had hemoglobin, patient does not have her menstrual cycle currently however she has no UTI symptoms. We'll defer antibiotics at this time for UTI and send urine for culture. Discussed this with patient who is aware that we are pending for confirmation of UTI. Discussed symptoms that would warrant return. Patient verbalized understanding and agreeable.   Final Clinical Impressions(s) / ED Diagnoses   Final diagnoses:  Nausea and vomiting in adult    ED Discharge Orders        Ordered    ondansetron (ZOFRAN ODT) 4 MG disintegrating tablet  Every 8 hours PRN     09/12/17 1847       Liberty Handy, PA-C 09/12/17 Carlis Stable    Shaune Pollack, MD 09/12/17 2308

## 2017-09-12 NOTE — ED Triage Notes (Signed)
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

## 2018-04-02 ENCOUNTER — Ambulatory Visit (INDEPENDENT_AMBULATORY_CARE_PROVIDER_SITE_OTHER): Payer: Self-pay

## 2018-04-02 ENCOUNTER — Encounter: Payer: Self-pay | Admitting: Family Medicine

## 2018-04-02 DIAGNOSIS — Z3201 Encounter for pregnancy test, result positive: Secondary | ICD-10-CM

## 2018-04-02 NOTE — Progress Notes (Signed)
Pt her for UPT-POSITIVE, 3 preg test at home all positive. Reviewed meds, advised to start Prenatals. LMP: 03/01/18, EDD: 12/06/18, GA: 4W 4D. Pt verbalized understanding.

## 2018-04-02 NOTE — Progress Notes (Signed)
I agree with the CMA's plan of care and note.  Venia Carbonasch, Jennifer I, NP 04/02/2018 1:30 PM

## 2018-04-05 LAB — POCT PREGNANCY, URINE: PREG TEST UR: POSITIVE — AB

## 2018-04-21 ENCOUNTER — Inpatient Hospital Stay (HOSPITAL_COMMUNITY)
Admission: AD | Admit: 2018-04-21 | Discharge: 2018-04-22 | Disposition: A | Discharge status: Home / Self Care | Payer: Medicaid Other | Source: Ambulatory Visit | Attending: Obstetrics & Gynecology | Admitting: Obstetrics & Gynecology

## 2018-04-21 ENCOUNTER — Encounter (HOSPITAL_COMMUNITY): Payer: Self-pay

## 2018-04-21 DIAGNOSIS — Z3A01 Less than 8 weeks gestation of pregnancy: Secondary | ICD-10-CM | POA: Diagnosis not present

## 2018-04-21 DIAGNOSIS — R1110 Vomiting, unspecified: Secondary | ICD-10-CM | POA: Diagnosis present

## 2018-04-21 DIAGNOSIS — Z886 Allergy status to analgesic agent status: Secondary | ICD-10-CM | POA: Diagnosis not present

## 2018-04-21 DIAGNOSIS — O219 Vomiting of pregnancy, unspecified: Secondary | ICD-10-CM | POA: Insufficient documentation

## 2018-04-21 DIAGNOSIS — Z87891 Personal history of nicotine dependence: Secondary | ICD-10-CM | POA: Insufficient documentation

## 2018-04-21 DIAGNOSIS — Z8249 Family history of ischemic heart disease and other diseases of the circulatory system: Secondary | ICD-10-CM | POA: Diagnosis not present

## 2018-04-21 DIAGNOSIS — Z87440 Personal history of urinary (tract) infections: Secondary | ICD-10-CM | POA: Insufficient documentation

## 2018-04-21 LAB — URINALYSIS, ROUTINE W REFLEX MICROSCOPIC
Bilirubin Urine: NEGATIVE
Glucose, UA: NEGATIVE mg/dL
Ketones, ur: 40 mg/dL — AB
Nitrite: NEGATIVE
Protein, ur: 100 mg/dL — AB
Specific Gravity, Urine: 1.02 (ref 1.005–1.030)
pH: 6.5 (ref 5.0–8.0)

## 2018-04-21 LAB — URINALYSIS, MICROSCOPIC (REFLEX)

## 2018-04-21 MED ORDER — FAMOTIDINE IN NACL 20-0.9 MG/50ML-% IV SOLN
20.0000 mg | Freq: Once | INTRAVENOUS | Status: AC
  Administered 2018-04-22: 20 mg via INTRAVENOUS
  Filled 2018-04-21: qty 50

## 2018-04-21 MED ORDER — PROMETHAZINE HCL 25 MG/ML IJ SOLN
12.5000 mg | Freq: Once | INTRAMUSCULAR | Status: AC
  Administered 2018-04-22: 12.5 mg via INTRAVENOUS
  Filled 2018-04-21: qty 1

## 2018-04-21 MED ORDER — M.V.I. ADULT IV INJ
Freq: Once | INTRAVENOUS | Status: DC
  Filled 2018-04-21: qty 10

## 2018-04-21 MED ORDER — LACTATED RINGERS IV BOLUS
1000.0000 mL | Freq: Once | INTRAVENOUS | Status: AC
  Administered 2018-04-22: 1000 mL via INTRAVENOUS

## 2018-04-21 NOTE — MAU Note (Signed)
Pt here for nausea and vomiting. States it started 2 weeks ago.Has not been able to keep anything down. Noticed some red streaking in vomit. Pt has not taken anything for nausea and does not have RX for nausea medication. Pt denies abdominal pain.

## 2018-04-22 MED ORDER — PROMETHAZINE HCL 25 MG PO TABS: 25.0000 mg | ORAL_TABLET | Freq: Four times a day (QID) | ORAL | 2 refills | Status: DC | PRN

## 2018-04-22 NOTE — Discharge Instructions (Signed)

## 2018-04-22 NOTE — MAU Provider Note (Signed)
History     CSN: [id number]  Arrival date and time: 04/21/18 2215   First Provider Initiated Contact with Patient 04/21/18 2347      Chief Complaint  Patient presents with  . Emesis   Karen Booth is a 22 y.o. G2P1 at [redacted]w[redacted]d who presents to MAU with complaints of nausea and vomiting.  She reports nausea vomiting has been occurring for the past 2 weeks.  Reports she has vomited 12 or more times per day and is unable to keep anything down including food and liquids.  She reports symptoms got worse over the last couple days and not being able to eat in the past 2 to 3 days.  She describes vomiting as red streaks in bile colored fluid.  She has not taking any medication for nausea or vomiting as she does not have any medication prescribed.  She denies abdominal pain or cramping, vaginal bleeding, vaginal discharge, headache, or urinary symptoms. She plans on receiving prenatal care at [facility].    OB History    Gravida  2   Para  1   Term  1   Preterm      AB      Living  1     SAB      TAB      Ectopic      Multiple      Live Births  1           Past Medical History:  Diagnosis Date  . Heart murmur   . Urinary tract infection     Past Surgical History:  Procedure Laterality Date  . TONSILLECTOMY      Family History  Problem Relation Age of Onset  . Heart disease Mother        murmur, arrythmia  . Hypertension Mother   . Heart murmur Mother   . Arrhythmia Mother   . Hypertension Father   . Breast cancer Maternal Grandmother        and g-gma  . Breast cancer Paternal Grandmother   . Esophageal cancer Maternal Uncle   . Diabetes Paternal Aunt   . Stomach cancer Maternal Uncle   . Hearing loss Neg Hx     Social History   Tobacco Use  . Smoking status: Former Games developer  . Smokeless tobacco: Never Used  . Tobacco comment: summer 2014  Substance Use Topics  . Alcohol use: No  . Drug use: No    Allergies:  Allergies  Allergen Reactions  . Ibuprofen  Anaphylaxis, Hives and Swelling    Oral swelling    Medications Prior to Admission  Medication Sig Dispense Refill Last Dose  . cephALEXin (KEFLEX) 500 MG capsule Take 1 capsule (500 mg total) by mouth 4 (four) times daily. (Patient not taking: Reported on 04/02/2018) 40 capsule 0 not taking  . cetirizine (ZYRTEC ALLERGY) 10 MG tablet Take 1 tablet (10 mg total) by mouth daily. 30 tablet 1 not taking  . fluticasone (FLONASE) 50 MCG/ACT nasal spray Place 2 sprays into both nostrils daily. (Patient not taking: Reported on 04/02/2018) 9.9 g 2 not taking  . ondansetron (ZOFRAN ODT) 4 MG disintegrating tablet Take 1 tablet (4 mg total) by mouth every 8 (eight) hours as needed for nausea or vomiting. (Patient not taking: Reported on 04/02/2018) 20 tablet 0 not taking  . Prenatal Vit-Fe Fumarate-FA (PRENATAL MULTIVITAMIN) TABS tablet Take 1 tablet by mouth daily at 12 noon.   not taking    Review of Systems  Constitutional: Negative.   Respiratory: Negative.   Cardiovascular: Negative.   Gastrointestinal: Positive for nausea and vomiting. Negative for abdominal pain, constipation and diarrhea.  Genitourinary: Negative.   Neurological: Negative.    Physical Exam   Vitals:   04/21/18 2255  BP: (!) 143/88  Pulse: (!) 115  Resp: 16  Temp: 99.7 F (37.6 C)  TempSrc: Oral  SpO2: 99%  Weight: 73 kg  Height: 5\' 1"  (1.549 m)   Physical Exam  Nursing note and vitals reviewed. Constitutional: She is oriented to person, place, and time. She appears well-developed and well-nourished. She appears distressed.  HENT:  Head: Normocephalic.  Cardiovascular: Normal rate, regular rhythm and normal heart sounds.  Respiratory: Effort normal. No respiratory distress. She has no wheezes.  GI: Soft. Bowel sounds are normal. She exhibits no distension. There is no tenderness.  Genitourinary:  Genitourinary Comments: Pelvic exam deferred  Musculoskeletal: Normal range of motion. She exhibits no edema.   Neurological: She is alert and oriented to person, place, and time.  Skin: Skin is warm and dry. There is pallor.  Psychiatric: She has a normal mood and affect. Her behavior is normal. Thought content normal.    MAU Course  Procedures  MDM UA- positive for ketones and protein   LR IV Bolus  Pepcid IV  Phenergan 12.5mg  IV  Banana bag with D5LR IV   Labs reviewed:  Results for orders placed or performed during the hospital encounter of 04/21/18 (from the past 24 hour(s))  Urinalysis, Routine w reflex microscopic     Status: Abnormal   Collection Time: 04/21/18 11:16 PM  Result Value Ref Range   Color, Urine YELLOW YELLOW   APPearance CLEAR CLEAR   Specific Gravity, Urine 1.020 1.005 - 1.030   pH 6.5 5.0 - 8.0   Glucose, UA NEGATIVE NEGATIVE mg/dL   Hgb urine dipstick MODERATE (A) NEGATIVE   Bilirubin Urine NEGATIVE NEGATIVE   Ketones, ur 40 (A) NEGATIVE mg/dL   Protein, ur 161 (A) NEGATIVE mg/dL   Nitrite NEGATIVE NEGATIVE   Leukocytes, UA TRACE (A) NEGATIVE  Urinalysis, Microscopic (reflex)     Status: Abnormal   Collection Time: 04/21/18 11:16 PM  Result Value Ref Range   RBC / HPF 6-10 0 - 5 RBC/hpf   WBC, UA 0-5 0 - 5 WBC/hpf   Bacteria, UA FEW (A) NONE SEEN   Squamous Epithelial / LPF 11-20 0 - 5   Patient able to tolerate crackers and juice prior to discharge home, reports feeling better. Patient is no longer pale. Educated and discussed reasons to return to MAU. Rx sent to pharmacy of choice for Phenergan. Make appointment to be seen at [facility] to initiate prenatal care. Pt stable at time of discharge.   Urine culture pending - will call patient with positive results and manage accordingly   Assessment and Plan   1. Nausea and vomiting during pregnancy   2. [redacted] weeks gestation of pregnancy    Discharge home  Make initial prenatal appointment  Safe medications during pregnancy reviewed- list given  Rx for Phenergan   Follow-up Information    [facility]. Schedule an appointment as soon as possible for a visit.   Specialty:  Obstetrics and Gynecology Why:  Make appointment to be seen to initiate prenatal care  Contact information: [address] [place] [address] [phone]         Allergies as of 04/22/2018      Reactions  Ibuprofen Anaphylaxis, Hives, Swelling   Oral swelling      Medication List    STOP taking these medications   cephALEXin 500 MG capsule Commonly known as:  KEFLEX   cetirizine 10 MG tablet Commonly known as:  ZYRTEC   ondansetron 4 MG disintegrating tablet Commonly known as:  ZOFRAN-ODT     TAKE these medications   fluticasone 50 MCG/ACT nasal spray Commonly known as:  FLONASE Place 2 sprays into both nostrils daily.   prenatal multivitamin Tabs tablet Take 1 tablet by mouth daily at 12 noon.   promethazine 25 MG tablet Commonly known as:  PHENERGAN Take 1 tablet (25 mg total) by mouth every 6 (six) hours as needed for nausea or vomiting.       Sharyon CableVeronica C Khayri Kargbo CNM 04/22/2018, 6:15 AM

## 2018-04-23 LAB — CULTURE, OB URINE: Culture: NO GROWTH

## 2018-05-21 ENCOUNTER — Ambulatory Visit: Payer: Self-pay | Admitting: Clinical

## 2018-05-21 ENCOUNTER — Other Ambulatory Visit (HOSPITAL_COMMUNITY)
Admission: RE | Admit: 2018-05-21 | Discharge: 2018-05-21 | Disposition: A | Discharge status: Home / Self Care | Payer: Medicaid Other | Source: Ambulatory Visit | Attending: Obstetrics & Gynecology | Admitting: Obstetrics & Gynecology

## 2018-05-21 ENCOUNTER — Ambulatory Visit (INDEPENDENT_AMBULATORY_CARE_PROVIDER_SITE_OTHER): Payer: Medicaid Other | Admitting: Obstetrics & Gynecology

## 2018-05-21 DIAGNOSIS — Z3480 Encounter for supervision of other normal pregnancy, unspecified trimester: Secondary | ICD-10-CM

## 2018-05-21 DIAGNOSIS — Z3481 Encounter for supervision of other normal pregnancy, first trimester: Secondary | ICD-10-CM | POA: Diagnosis not present

## 2018-05-21 DIAGNOSIS — Z23 Encounter for immunization: Secondary | ICD-10-CM

## 2018-05-21 DIAGNOSIS — O99211 Obesity complicating pregnancy, first trimester: Secondary | ICD-10-CM

## 2018-05-21 DIAGNOSIS — O99210 Obesity complicating pregnancy, unspecified trimester: Secondary | ICD-10-CM

## 2018-05-21 DIAGNOSIS — O0990 Supervision of high risk pregnancy, unspecified, unspecified trimester: Secondary | ICD-10-CM | POA: Insufficient documentation

## 2018-05-21 LAB — POCT URINALYSIS DIP (DEVICE)
BILIRUBIN URINE: NEGATIVE
Glucose, UA: NEGATIVE mg/dL
KETONES UR: NEGATIVE mg/dL
LEUKOCYTES UA: NEGATIVE
Nitrite: NEGATIVE
Protein, ur: NEGATIVE mg/dL
UROBILINOGEN UA: 0.2 mg/dL (ref 0.0–1.0)
pH: 6 (ref 5.0–8.0)

## 2018-05-21 MED ORDER — METRONIDAZOLE 500 MG PO TABS: 500.0000 mg | ORAL_TABLET | Freq: Two times a day (BID) | ORAL | 0 refills | Status: DC

## 2018-05-21 NOTE — BH Specialist Note (Signed)
Integrated Behavioral Health Initial Visit  MRN: [id number] Name: Karen Booth  Number of Integrated Behavioral Health Clinician visits:: 1/6 Session Start time: 11:25  Session End time: 11:34 Total time: 15 minutes  Type of Service: Integrated Behavioral Health- Individual/Family Interpretor:No. Interpretor Name and Language: n/a   Warm Hand Off Completed.       SUBJECTIVE: Karen Booth is a 22 y.o. female accompanied by n/a Patient was referred by Nicholaus Bloom, MD for Initial OB introduction to integrated behavioral health services . Patient reports the following symptoms/concerns: Pt states mild "morning sickness" that she experienced up to 6 months with her last pregnancy, and mild difficulty sleeping; no other concerns today. Duration of problem: Current pregnancy; Severity of problem: mild  OBJECTIVE: Mood: Normal and Affect: Appropriate Risk of harm to self or others: No plan to harm self or others  LIFE CONTEXT: Family and Social: Pt has a 4yo son School/Work: - Self-Care: - Life Changes: Current pregnancy   GOALS ADDRESSED: Patient will: 1. Increase knowledge and/or ability of: healthy habits   INTERVENTIONS: Interventions utilized: Psychoeducation and/or Health Education  Standardized Assessments completed: GAD-7 and PHQ 9  ASSESSMENT: Patient currently experiencing Supervision of other normal pregnancy, antepartum.   Patient may benefit from Initial OB introduction to integrated behavioral health services .  PLAN: 1. Follow up with behavioral health clinician on : As needed 2. Behavioral recommendations:  -Consider taking prenatal vitamin, as recommended by medical provider -Use sleep app tonight; continue if remains helpful 3. Referral(s): Integrated [employer] (In Clinic) 4. "From scale of 1-10, how likely are you to follow plan?": 10  Rae Lips, LCSW  Depression screen [facility] 2/9 05/21/2018 01/27/2015  Decreased Interest 2 0  Down,  Depressed, Hopeless 0 0  PHQ - 2 Score 2 0  Altered sleeping 3 -  Tired, decreased energy 3 -  Change in appetite 3 -  Feeling bad or failure about yourself  0 -  Trouble concentrating 0 -  Moving slowly or fidgety/restless 0 -  Suicidal thoughts 0 -  PHQ-9 Score 11 -   GAD 7 : Generalized Anxiety Score 05/21/2018  Nervous, Anxious, on Edge 0  Control/stop worrying 0  Worry too much - different things 0  Trouble relaxing 2  Restless 0  Easily annoyed or irritable 1  Afraid - awful might happen 0  Total GAD 7 Score 3

## 2018-05-21 NOTE — Progress Notes (Signed)
  Subjective:    Karen Booth is being seen today for her first obstetrical visit.  She is at [redacted]w[redacted]d gestation. Her obstetrical history is significant for obesity. Patient does intend to breast feed. Pregnancy history fully reviewed.  Patient reports no complaints.  Review of Systems:   Review of Systems  Objective:     BP 136/74   Pulse (!) 108   Wt 163 lb 4.8 oz (74.1 kg)   LMP 03/01/2018   BMI 30.86 kg/m  Physical Exam  Exam  Breathing, conversing, and ambulating normally Well nourished, well hydrated Black female, no apparent distress Heart- rrr Lungs- CTAB Abd- benign Cervix- normal, discharge c/w BV    Assessment:    Pregnancy: G2P1001 Patient Active Problem List   Diagnosis Date Noted  . Supervision of other normal pregnancy, antepartum 05/21/2018  . Obesity in pregnancy 05/21/2018  . Active labor 04/06/2014  . Heart palpitations 03/01/2014       Plan:     Initial labs drawn. Prenatal vitamins. Problem list reviewed and updated. NIPS and AFP requested Role of ultrasound in pregnancy discussed; fetal survey: ordered for 19 weeks anatomy scan Follow up in 4 weeks. Flu vaccine today Treat BV with flagyl She declines optimized schedule with Baby scripts. I asked her to download the app.   Allie Bossier 05/21/2018

## 2018-05-24 LAB — CYTOLOGY - PAP
BACTERIAL VAGINITIS: POSITIVE — AB
CANDIDA VAGINITIS: NEGATIVE
CHLAMYDIA, DNA PROBE: NEGATIVE
DIAGNOSIS: NEGATIVE
NEISSERIA GONORRHEA: NEGATIVE
Trichomonas: NEGATIVE

## 2018-05-24 LAB — CULTURE, OB URINE

## 2018-05-24 LAB — URINE CULTURE, OB REFLEX

## 2018-05-25 ENCOUNTER — Encounter: Payer: Self-pay | Admitting: *Deleted

## 2018-05-26 ENCOUNTER — Telehealth: Payer: Self-pay | Admitting: General Practice

## 2018-05-26 NOTE — Telephone Encounter (Signed)
-----   Message from Allie Bossier, MD sent at 05/26/2018  8:44 AM EDT ----- She will need flagyl for bv in urine and on pap. Thanks

## 2018-05-26 NOTE — Telephone Encounter (Signed)
Per chart review, patient was treated the day of her visit. Called patient, no answer- left message stating we are trying to reach you with non urgent results, please call us back.

## 2018-05-28 ENCOUNTER — Telehealth: Payer: Self-pay | Admitting: *Deleted

## 2018-05-28 MED ORDER — FAMOTIDINE 20 MG PO TABS: 20.0000 mg | ORAL_TABLET | Freq: Two times a day (BID) | ORAL | 3 refills | Status: DC

## 2018-05-28 MED ORDER — DOXYLAMINE-PYRIDOXINE 10-10 MG PO TBEC: 2.0000 | DELAYED_RELEASE_TABLET | Freq: Every day | ORAL | 3 refills | Status: DC

## 2018-05-28 NOTE — Telephone Encounter (Signed)
Pt left message stating that she is [redacted] weeks pregnant and is having nausea and vomiting 5 times daily. The Rx for promethazine is not working. She requests alternate Rx. After consult with Dr. Jolayne Panther, I called pt back and advised that I will send 2 new prescriptions. Dosage instructions explained. Also, she should focus on keeping fluids down and only eat small amounts of food if possible. She may try smoothies or Ensure if fluids are staying down. Pt should go to MAU if she is not able to keep fluids down. Pt voiced understanding.

## 2018-05-31 ENCOUNTER — Encounter (HOSPITAL_COMMUNITY): Payer: Self-pay | Admitting: *Deleted

## 2018-05-31 ENCOUNTER — Inpatient Hospital Stay (HOSPITAL_COMMUNITY)
Admission: AD | Admit: 2018-05-31 | Discharge: 2018-05-31 | Disposition: A | Discharge status: Home / Self Care | Payer: Medicaid Other | Source: Ambulatory Visit | Attending: Obstetrics and Gynecology | Admitting: Obstetrics and Gynecology

## 2018-05-31 DIAGNOSIS — O99210 Obesity complicating pregnancy, unspecified trimester: Secondary | ICD-10-CM

## 2018-05-31 DIAGNOSIS — O26891 Other specified pregnancy related conditions, first trimester: Secondary | ICD-10-CM

## 2018-05-31 DIAGNOSIS — Z886 Allergy status to analgesic agent status: Secondary | ICD-10-CM | POA: Insufficient documentation

## 2018-05-31 DIAGNOSIS — Z79899 Other long term (current) drug therapy: Secondary | ICD-10-CM | POA: Insufficient documentation

## 2018-05-31 DIAGNOSIS — Z3A13 13 weeks gestation of pregnancy: Secondary | ICD-10-CM | POA: Insufficient documentation

## 2018-05-31 DIAGNOSIS — O219 Vomiting of pregnancy, unspecified: Secondary | ICD-10-CM | POA: Diagnosis not present

## 2018-05-31 DIAGNOSIS — Z3480 Encounter for supervision of other normal pregnancy, unspecified trimester: Secondary | ICD-10-CM

## 2018-05-31 LAB — OBSTETRIC PANEL, INCLUDING HIV
ANTIBODY SCREEN: NEGATIVE
BASOS: 0 %
Basophils Absolute: 0 10*3/uL (ref 0.0–0.2)
EOS (ABSOLUTE): 0.1 10*3/uL (ref 0.0–0.4)
Eos: 1 %
HEMATOCRIT: 35.4 % (ref 34.0–46.6)
HIV SCREEN 4TH GENERATION: NONREACTIVE
Hemoglobin: 12.1 g/dL (ref 11.1–15.9)
Hepatitis B Surface Ag: NEGATIVE
Immature Grans (Abs): 0.1 10*3/uL (ref 0.0–0.1)
Immature Granulocytes: 1 %
LYMPHS ABS: 2.1 10*3/uL (ref 0.7–3.1)
Lymphs: 24 %
MCH: 27.4 pg (ref 26.6–33.0)
MCHC: 34.2 g/dL (ref 31.5–35.7)
MCV: 80 fL (ref 79–97)
MONOCYTES: 4 %
Monocytes Absolute: 0.4 10*3/uL (ref 0.1–0.9)
NEUTROS ABS: 6.5 10*3/uL (ref 1.4–7.0)
Neutrophils: 70 %
Platelets: 221 10*3/uL (ref 150–450)
RBC: 4.41 x10E6/uL (ref 3.77–5.28)
RDW: 15 % (ref 12.3–15.4)
RH TYPE: POSITIVE
RPR Ser Ql: NONREACTIVE
RUBELLA: 1.91 {index} (ref >0.99)
WBC: 9.1 10*3/uL (ref 3.4–10.8)

## 2018-05-31 LAB — URINALYSIS, ROUTINE W REFLEX MICROSCOPIC
Bilirubin Urine: NEGATIVE
Glucose, UA: NEGATIVE mg/dL
Ketones, ur: NEGATIVE mg/dL
Leukocytes, UA: NEGATIVE
Nitrite: NEGATIVE
Protein, ur: NEGATIVE mg/dL
Specific Gravity, Urine: 1.023 (ref 1.005–1.030)
pH: 6 (ref 5.0–8.0)

## 2018-05-31 LAB — CYSTIC FIBROSIS GENE TEST

## 2018-05-31 LAB — SMN1 COPY NUMBER ANALYSIS (SMA CARRIER SCREENING)

## 2018-05-31 LAB — HEMOGLOBIN A1C
ESTIMATED AVERAGE GLUCOSE: 100 mg/dL
Hgb A1c MFr Bld: 5.1 % (ref 4.8–5.6)

## 2018-05-31 MED ORDER — ONDANSETRON 8 MG PO TBDP
8.0000 mg | ORAL_TABLET | Freq: Once | ORAL | Status: AC
  Administered 2018-05-31: 8 mg via ORAL
  Filled 2018-05-31: qty 1

## 2018-05-31 MED ORDER — GLYCOPYRROLATE 1 MG PO TABS: 1.0000 mg | ORAL_TABLET | Freq: Three times a day (TID) | ORAL | 0 refills | Status: DC

## 2018-05-31 MED ORDER — GLYCOPYRROLATE 1 MG PO TABS
2.0000 mg | ORAL_TABLET | Freq: Once | ORAL | Status: AC
  Administered 2018-05-31: 2 mg via ORAL
  Filled 2018-05-31: qty 2

## 2018-05-31 MED ORDER — ONDANSETRON 4 MG PO TBDP: 4.0000 mg | ORAL_TABLET | Freq: Three times a day (TID) | ORAL | 2 refills | Status: DC | PRN

## 2018-05-31 NOTE — MAU Note (Deleted)
PT  SAYS SHE WAS TOLD TO RETURN  FOR LABS  AND U/S.   PAIN IS SAME AS  SAT.  NO BLEEDING TODAY-

## 2018-05-31 NOTE — MAU Provider Note (Signed)
History     CSN: [id number]  Arrival date and time: 05/31/18 2103   First Provider Initiated Contact with Patient 05/31/18 2221      Chief Complaint  Patient presents with  . Emesis   Karen Booth is a 22 y.o. G2P1 at [redacted]w[redacted]d who presents to MAU with complaints of nausea and vomiting. She reports N/V has been occurring since finding out she was pregnant. Was seen in MAU for same complaint in September and prescribed Phenergan. She reports running out of Phenergan but it was not working after "a while". She reports vomiting 2-3 times over the past 24 hours but the main complaint being continued spitting and having to walk around with a bottle. She denies abdominal pain, HA, urinary symptoms or vaginal bleeding.    OB History    Gravida  2   Para  1   Term  1   Preterm      AB      Living  1     SAB      TAB      Ectopic      Multiple      Live Births  1           Past Medical History:  Diagnosis Date  . Heart murmur   . Urinary tract infection     Past Surgical History:  Procedure Laterality Date  . TONSILLECTOMY      Family History  Problem Relation Age of Onset  . Heart disease Mother        murmur, arrythmia  . Hypertension Mother   . Heart murmur Mother   . Arrhythmia Mother   . Hypertension Father   . Breast cancer Maternal Grandmother        and g-gma  . Breast cancer Paternal Grandmother   . Esophageal cancer Maternal Uncle   . Diabetes Paternal Aunt   . Stomach cancer Maternal Uncle   . Hearing loss Neg Hx     Social History   Tobacco Use  . Smoking status: Never Smoker  . Smokeless tobacco: Never Used  . Tobacco comment: summer 2014  Substance Use Topics  . Alcohol use: No  . Drug use: Yes    Types: Marijuana    Comment: last use 30 May 2018    Allergies:  Allergies  Allergen Reactions  . Ibuprofen Anaphylaxis, Hives and Swelling    Oral swelling    Medications Prior to Admission  Medication Sig Dispense Refill Last  Dose  . Doxylamine-Pyridoxine 10-10 MG TBEC Take 2 tablets by mouth at bedtime. 60 tablet 3   . famotidine (PEPCID) 20 MG tablet Take 1 tablet (20 mg total) by mouth 2 (two) times daily. 60 tablet 3 05/31/2018 at Unknown time  . metroNIDAZOLE (FLAGYL) 500 MG tablet Take 1 tablet (500 mg total) by mouth 2 (two) times daily. 14 tablet 0   . promethazine (PHENERGAN) 25 MG tablet Take 1 tablet (25 mg total) by mouth every 6 (six) hours as needed for nausea or vomiting. 30 tablet 2 Past Month at Unknown time  . fluticasone (FLONASE) 50 MCG/ACT nasal spray Place 2 sprays into both nostrils daily. (Patient not taking: Reported on 04/02/2018) 9.9 g 2 More than a month at Unknown time  . Prenatal Vit-Fe Fumarate-FA (PRENATAL MULTIVITAMIN) TABS tablet Take 1 tablet by mouth daily at 12 noon.   More than a month at Unknown time    Review of Systems  Constitutional: Negative.   Respiratory: Negative.  Cardiovascular: Negative.   Gastrointestinal: Positive for nausea and vomiting. Negative for abdominal pain, constipation and diarrhea.  Genitourinary: Negative.   Neurological: Negative.    Physical Exam   Blood pressure 135/81, pulse 94, temperature 99.1 F (37.3 C), temperature source Oral, resp. rate 15, height 5\' 1"  (1.549 m), weight 74 kg, last menstrual period 03/01/2018.  Physical Exam  Nursing note and vitals reviewed. Constitutional: She is oriented to person, place, and time. She appears well-developed and well-nourished. No distress.  Cardiovascular: Normal rate, regular rhythm and normal heart sounds.  Respiratory: Effort normal and breath sounds normal. No respiratory distress. She has no wheezes.  GI: Soft. She exhibits no distension. There is no tenderness.  Neurological: She is alert and oriented to person, place, and time.  Skin: Skin is warm and dry.  Psychiatric: She has a normal mood and affect. Her behavior is normal. Thought content normal.   FHR 156 by doppler   MAU Course   Procedures  MDM Orders Placed This Encounter  Procedures  . Culture, OB Urine  . Urinalysis, Routine w reflex microscopic   Labs reviewed:  Results for orders placed or performed during the hospital encounter of 05/31/18 (from the past 24 hour(s))  Urinalysis, Routine w reflex microscopic     Status: Abnormal   Collection Time: 05/31/18  9:51 PM  Result Value Ref Range   Color, Urine YELLOW YELLOW   APPearance HAZY (A) CLEAR   Specific Gravity, Urine 1.023 1.005 - 1.030   pH 6.0 5.0 - 8.0   Glucose, UA NEGATIVE NEGATIVE mg/dL   Hgb urine dipstick MODERATE (A) NEGATIVE   Bilirubin Urine NEGATIVE NEGATIVE   Ketones, ur NEGATIVE NEGATIVE mg/dL   Protein, ur NEGATIVE NEGATIVE mg/dL   Nitrite NEGATIVE NEGATIVE   Leukocytes, UA NEGATIVE NEGATIVE   RBC / HPF 0-5 0 - 5 RBC/hpf   WBC, UA 6-10 0 - 5 WBC/hpf   Bacteria, UA RARE (A) NONE SEEN   Squamous Epithelial / LPF 21-50 0 - 5   Mucus PRESENT    UA- negative for ketones  Urine culture pending   Meds ordered this encounter  Medications  . ondansetron (ZOFRAN-ODT) disintegrating tablet 8 mg  . glycopyrrolate (ROBINUL) tablet 2 mg  . ondansetron (ZOFRAN ODT) 4 MG disintegrating tablet    Sig: Take 1 tablet (4 mg total) by mouth every 8 (eight) hours as needed for nausea or vomiting.    Dispense:  30 tablet    Refill:  2    Order Specific Question:   Supervising Provider    Answer:   Reva Bores [[id number]]  . glycopyrrolate (ROBINUL) 1 MG tablet    Sig: Take 1 tablet (1 mg total) by mouth 3 (three) times daily.    Dispense:  90 tablet    Refill:  0    Order Specific Question:   Supervising Provider    Answer:   Reva Bores [[id number]]   Treatments in MAU included Zofran 8mg  ODT and Robinul 2mg  PO. Patient able to tolerate gingerale after medication.  Rx for Zofran and Robinul sent to pharmacy of choice. Pt stable at time of discharge.   Assessment and Plan   1. Nausea and vomiting during pregnancy   2. [redacted] weeks gestation of  pregnancy    Discharge home  Follow up as scheduled for prenatal appointments  Return to MAU as needed  Rx for Zofran and Robinul    Sharyon Cable CNM 05/31/2018, 11:10 PM

## 2018-05-31 NOTE — MAU Note (Signed)
PT SAYS GETS PNC  AT  CLINIC-   DID NOT TELL THEM ABOUT  SPITTING.   STILL  HAS N/V - PHENERGAN-  HAS FINISHED RX- BUT  SAYS DOESN'T WORK      TAKES  PEPCID-  HELPS -  TOOK LAST AT 7PM.

## 2018-06-02 LAB — CULTURE, OB URINE: Culture: 80000 — AB

## 2018-06-03 ENCOUNTER — Encounter: Payer: Self-pay | Admitting: *Deleted

## 2018-06-07 ENCOUNTER — Encounter: Payer: Self-pay | Admitting: *Deleted

## 2018-06-17 ENCOUNTER — Inpatient Hospital Stay (HOSPITAL_COMMUNITY)
Admission: AD | Admit: 2018-06-17 | Discharge: 2018-06-18 | Disposition: A | Discharge status: Home / Self Care | Payer: Medicaid Other | Source: Ambulatory Visit | Attending: Obstetrics & Gynecology | Admitting: Obstetrics & Gynecology

## 2018-06-17 DIAGNOSIS — O99210 Obesity complicating pregnancy, unspecified trimester: Secondary | ICD-10-CM

## 2018-06-17 DIAGNOSIS — R109 Unspecified abdominal pain: Secondary | ICD-10-CM | POA: Diagnosis not present

## 2018-06-17 DIAGNOSIS — Z3480 Encounter for supervision of other normal pregnancy, unspecified trimester: Secondary | ICD-10-CM

## 2018-06-17 DIAGNOSIS — Z5321 Procedure and treatment not carried out due to patient leaving prior to being seen by health care provider: Secondary | ICD-10-CM | POA: Insufficient documentation

## 2018-06-17 LAB — URINALYSIS, ROUTINE W REFLEX MICROSCOPIC
Bilirubin Urine: NEGATIVE
Glucose, UA: NEGATIVE mg/dL
KETONES UR: NEGATIVE mg/dL
Leukocytes, UA: NEGATIVE
Nitrite: NEGATIVE
PH: 6 (ref 5.0–8.0)
Protein, ur: NEGATIVE mg/dL
Specific Gravity, Urine: 1.021 (ref 1.005–1.030)

## 2018-06-17 NOTE — MAU Note (Signed)
Pt having abdominal and back pain. No BM in 1 week. No bleeding. Pain is 3/10. Has not taken anything for constipation.

## 2018-06-17 NOTE — MAU Note (Signed)
Pt not in lobby.  

## 2018-06-18 ENCOUNTER — Ambulatory Visit (INDEPENDENT_AMBULATORY_CARE_PROVIDER_SITE_OTHER): Payer: Medicaid Other | Admitting: Student

## 2018-06-18 VITALS — BP 133/87 | HR 95 | Wt 162.9 lb

## 2018-06-18 DIAGNOSIS — K5900 Constipation, unspecified: Secondary | ICD-10-CM

## 2018-06-18 DIAGNOSIS — Z3480 Encounter for supervision of other normal pregnancy, unspecified trimester: Secondary | ICD-10-CM

## 2018-06-18 DIAGNOSIS — Z3482 Encounter for supervision of other normal pregnancy, second trimester: Secondary | ICD-10-CM

## 2018-06-18 DIAGNOSIS — O99612 Diseases of the digestive system complicating pregnancy, second trimester: Secondary | ICD-10-CM

## 2018-06-18 NOTE — Patient Instructions (Signed)
Safe Medications in Pregnancy  ° °Acne: °Benzoyl Peroxide °Salicylic Acid ° °Backache/Headache: °Tylenol: 2 regular strength every 4 hours OR °             2 Extra strength every 6 hours ° °Colds/Coughs/Allergies: °Benadryl (alcohol free) 25 mg every 6 hours as needed °Breath right strips °Claritin °Cepacol throat lozenges °Chloraseptic throat spray °Cold-Eeze- up to three times per day °Cough drops, alcohol free °Flonase (by prescription only) °Guaifenesin °Mucinex °Robitussin DM (plain only, alcohol free) °Saline nasal spray/drops °Sudafed (pseudoephedrine) & Actifed ** use only after [redacted] weeks gestation and if you do not have high blood pressure °Tylenol °Vicks Vaporub °Zinc lozenges °Zyrtec  ° °Constipation: °Colace °Ducolax suppositories °Fleet enema °Glycerin suppositories °Metamucil °Milk of magnesia °Miralax °Senokot °Smooth move tea ° °Diarrhea: °Kaopectate °Imodium A-D ° °*NO pepto Bismol ° °Hemorrhoids: °Anusol °Anusol HC °Preparation H °Tucks ° °Indigestion: °Tums °Maalox °Mylanta °Zantac  °Pepcid ° °Insomnia: °Benadryl (alcohol free) 25mg every 6 hours as needed °Tylenol PM °Unisom, no Gelcaps ° °Leg Cramps: °Tums °MagGel ° °Nausea/Vomiting:  °Bonine °Dramamine °Emetrol °Ginger extract °Sea bands °Meclizine  °Nausea medication to take during pregnancy:  °Unisom (doxylamine succinate 25 mg tablets) Take one tablet daily at bedtime. If symptoms are not adequately controlled, the dose can be increased to a maximum recommended dose of two tablets daily (1/2 tablet in the morning, 1/2 tablet mid-afternoon and one at bedtime). °Vitamin B6 100mg tablets. Take one tablet twice a day (up to 200 mg per day). ° °Skin Rashes: °Aveeno products °Benadryl cream or 25mg every 6 hours as needed °Calamine Lotion °1% cortisone cream ° °Yeast infection: °Gyne-lotrimin 7 °Monistat 7 ° °Gum/tooth pain: °Anbesol ° °**If taking multiple medications, please check labels to avoid duplicating the same active ingredients °**take  medication as directed on the label °** Do not exceed 4000 mg of tylenol in 24 hours °**Do not take medications that contain aspirin or ibuprofen ° ° ° ° °Constipation, Adult °Constipation is when a person has fewer bowel movements in a week than normal, has difficulty having a bowel movement, or has stools that are dry, hard, or larger than normal. Constipation may be caused by an underlying condition. It may become worse with age if a person takes certain medicines and does not take in enough fluids. °Follow these instructions at home: °Eating and drinking ° °· Eat foods that have a lot of fiber, such as fresh fruits and vegetables, whole grains, and beans. °· Limit foods that are high in fat, low in fiber, or overly processed, such as french fries, hamburgers, cookies, candies, and soda. °· Drink enough fluid to keep your urine clear or pale yellow. °General instructions °· Exercise regularly or as told by your health care provider. °· Go to the restroom when you have the urge to go. Do not hold it in. °· Take over-the-counter and prescription medicines only as told by your health care provider. These include any fiber supplements. °· Practice pelvic floor retraining exercises, such as deep breathing while relaxing the lower abdomen and pelvic floor relaxation during bowel movements. °· Watch your condition for any changes. °· Keep all follow-up visits as told by your health care provider. This is important. °Contact a health care provider if: °· You have pain that gets worse. °· You have a fever. °· You do not have a bowel movement after 4 days. °· You vomit. °· You are not hungry. °· You lose weight. °· You are bleeding from the anus. °·   You have thin, pencil-like stools. °Get help right away if: °· You have a fever and your symptoms suddenly get worse. °· You leak stool or have blood in your stool. °· Your abdomen is bloated. °· You have severe pain in your abdomen. °· You feel dizzy or you faint. °This  information is not intended to replace advice given to you by your health care provider. Make sure you discuss any questions you have with your health care provider. °Document Released: 04/25/2004 Document Revised: 02/15/2016 Document Reviewed: 01/16/2016 °Elsevier Interactive Patient Education © 2018 [employer] ° °

## 2018-06-18 NOTE — MAU Note (Signed)
Pt not in lobby x2 

## 2018-06-18 NOTE — Progress Notes (Signed)
   PRENATAL VISIT NOTE  Subjective:  Karen Booth is a 22 y.o. G2P1001 at [redacted]w[redacted]d being seen today for ongoing prenatal care.  She is currently monitored for the following issues for this low-risk pregnancy and has Heart palpitations; Active labor; Supervision of other normal pregnancy, antepartum; and Obesity in pregnancy on their problem list.  Patient reports constipation.  Contractions: Not present. Vag. Bleeding: None.  Movement: Absent. Denies leaking of fluid.   The following portions of the patient's history were reviewed and updated as appropriate: allergies, current medications, past family history, past medical history, past social history, past surgical history and problem list. Problem list updated.  Objective:   Vitals:   06/18/18 1149 06/18/18 1150  BP: (!) 151/88 133/87  Pulse: (!) 102 95  Weight: 162 lb 14.4 oz (73.9 kg)     Fetal Status: Fetal Heart Rate (bpm): 152   Movement: Absent     General:  Alert, oriented and cooperative. Patient is in no acute distress.  Skin: Skin is warm and dry. No rash noted.   Cardiovascular: Normal heart rate noted  Respiratory: Normal respiratory effort, no problems with respiration noted  Abdomen: Soft, gravid, appropriate for gestational age.  Pain/Pressure: Absent     Pelvic: Cervical exam deferred        Extremities: Normal range of motion.  Edema: None  Mental Status: Normal mood and affect. Normal behavior. Normal judgment and thought content.   Assessment and Plan:  Pregnancy: G2P1001 at [redacted]w[redacted]d  1. Supervision of other normal pregnancy, antepartum -doing well -offered AFP, declines. Panorama low risk.  -Anatomy u/s scheduled  2. Constipation during pregnancy in second trimester - BM x 1 in last week. Has not been treating constipation. Discussed tx at home including diet, stool softeners, & laxative. Given OTC list of meds & written info for constipation -Pt concerned d/t elevated BP during this visit. No history. Per chart  review, no elevated BPs seen. Will continue to monitor. Discussed concerning symptoms r/t BP & reasons to be seen.   Preterm labor symptoms and general obstetric precautions including but not limited to vaginal bleeding, contractions, leaking of fluid and fetal movement were reviewed in detail with the patient. Please refer to After Visit Summary for other counseling recommendations.  Return in about 4 weeks (around 07/16/2018) for Routine OB.  Future Appointments  Date Time Provider Department Center  07/16/2018  9:15 AM Judeth Horn, NP [facility]OC  07/16/2018 10:15 AM [facility] [place] 4 WH-MFCUS MFC-US    Judeth Horn, NP

## 2018-06-18 NOTE — Progress Notes (Signed)
1st call. Pt not in lobby.  

## 2018-06-24 ENCOUNTER — Telehealth: Payer: Self-pay | Admitting: Family Medicine

## 2018-06-24 NOTE — Telephone Encounter (Signed)
LVM about FMLA paperwork for FOB has been completed and ready for pick-up

## 2018-06-27 ENCOUNTER — Other Ambulatory Visit: Payer: Self-pay | Admitting: Certified Nurse Midwife

## 2018-07-07 ENCOUNTER — Encounter (HOSPITAL_COMMUNITY): Payer: Self-pay

## 2018-07-16 ENCOUNTER — Other Ambulatory Visit (HOSPITAL_COMMUNITY): Payer: Self-pay | Admitting: *Deleted

## 2018-07-16 ENCOUNTER — Ambulatory Visit (HOSPITAL_COMMUNITY)
Admission: RE | Admit: 2018-07-16 | Discharge: 2018-07-16 | Disposition: A | Discharge status: Home / Self Care | Payer: Medicaid Other | Source: Ambulatory Visit | Attending: Obstetrics & Gynecology | Admitting: Obstetrics & Gynecology

## 2018-07-16 ENCOUNTER — Ambulatory Visit (INDEPENDENT_AMBULATORY_CARE_PROVIDER_SITE_OTHER): Payer: Medicaid Other | Admitting: Student

## 2018-07-16 VITALS — BP 132/78 | HR 101 | Wt 168.5 lb

## 2018-07-16 DIAGNOSIS — Z3A19 19 weeks gestation of pregnancy: Secondary | ICD-10-CM

## 2018-07-16 DIAGNOSIS — O219 Vomiting of pregnancy, unspecified: Secondary | ICD-10-CM

## 2018-07-16 DIAGNOSIS — Z3482 Encounter for supervision of other normal pregnancy, second trimester: Secondary | ICD-10-CM

## 2018-07-16 DIAGNOSIS — Z363 Encounter for antenatal screening for malformations: Secondary | ICD-10-CM

## 2018-07-16 DIAGNOSIS — Z3480 Encounter for supervision of other normal pregnancy, unspecified trimester: Secondary | ICD-10-CM

## 2018-07-16 DIAGNOSIS — Z362 Encounter for other antenatal screening follow-up: Secondary | ICD-10-CM

## 2018-07-16 DIAGNOSIS — K117 Disturbances of salivary secretion: Secondary | ICD-10-CM

## 2018-07-16 MED ORDER — FAMOTIDINE 20 MG PO TABS: 20.0000 mg | ORAL_TABLET | Freq: Two times a day (BID) | ORAL | 3 refills | Status: DC

## 2018-07-16 MED ORDER — ONDANSETRON 4 MG PO TBDP: 4.0000 mg | ORAL_TABLET | Freq: Three times a day (TID) | ORAL | 2 refills | Status: DC | PRN

## 2018-07-16 MED ORDER — GLYCOPYRROLATE 1 MG PO TABS: 1.0000 mg | ORAL_TABLET | Freq: Three times a day (TID) | ORAL | 0 refills | Status: DC

## 2018-07-16 MED ORDER — DICLEGIS 10-10 MG PO TBEC: 2.0000 | DELAYED_RELEASE_TABLET | Freq: Every day | ORAL | 3 refills | Status: DC

## 2018-07-16 NOTE — Patient Instructions (Signed)

## 2018-07-16 NOTE — Progress Notes (Signed)
   PRENATAL VISIT NOTE  Subjective:  Karen Booth is a 22 y.o. Karen Booth at 6758w4d being seen today for ongoing prenatal care.  She is currently monitored for the following issues for this low-risk pregnancy and has Supervision of other normal pregnancy, antepartum and Obesity in pregnancy on their problem list.  Patient reports nausea and vomiting. Reports n/v returned a week ago. Denies diarrhea, fever, or abdominal cramping. Has run out of her nausea meds. Also continues to have increased spitting & requesting refill for robinul. Contractions: Not present. Vag. Bleeding: None.  Movement: Present. Denies leaking of fluid.   The following portions of the patient's history were reviewed and updated as appropriate: allergies, current medications, past family history, past medical history, past social history, past surgical history and problem list. Problem list updated.  Objective:   Vitals:   07/16/18 0912  BP: 132/78  Pulse: (!) 101  Weight: 168 lb 8 oz (76.4 kg)    Fetal Status: Fetal Heart Rate (bpm): 154   Movement: Present    Fundal height at umbilicus  General:  Alert, oriented and cooperative. Patient is in no acute distress.  Skin: Skin is warm and dry. No rash noted.   Cardiovascular: Normal heart rate noted  Respiratory: Normal respiratory effort, no problems with respiration noted  Abdomen: Soft, gravid, appropriate for gestational age.  Pain/Pressure: Absent     Pelvic: Cervical exam deferred        Extremities: Normal range of motion.  Edema: None  Mental Status: Normal mood and affect. Normal behavior. Normal judgment and thought content.   Assessment and Plan:  Pregnancy: Karen Booth at 6958w4d  1. Supervision of other normal pregnancy, antepartum -Reports resolution of constipation  -excited about ultrasound appointment later today -doing well -encouraged to sign up for mychart --- resent code  2. Nausea and vomiting during pregnancy prior to [redacted] weeks gestation  -  DICLEGIS 10-10 MG TBEC; Take 2 tablets by mouth at bedtime.  Dispense: 60 tablet; Refill: 3 - famotidine (PEPCID) 20 MG tablet; Take 1 tablet (20 mg total) by mouth 2 (two) times daily.  Dispense: 60 tablet; Refill: 3 - ondansetron (ZOFRAN ODT) 4 MG disintegrating tablet; Take 1 tablet (4 mg total) by mouth every 8 (eight) hours as needed for nausea or vomiting.  Dispense: 30 tablet; Refill: 2  3. Ptyalism  - glycopyrrolate (ROBINUL) 1 MG tablet; Take 1 tablet (1 mg total) by mouth 3 (three) times daily.  Dispense: 90 tablet; Refill: 0  Preterm labor symptoms and general obstetric precautions including but not limited to vaginal bleeding, contractions, leaking of fluid and fetal movement were reviewed in detail with the patient. Please refer to After Visit Summary for other counseling recommendations.  No follow-ups on file.  Future Appointments  Date Time Provider Department Center  07/16/2018 10:15 AM [facility]-M[facility] US 4 WH-MFCUS MFC-US  08/13/2018  9:35 AM Judeth HornLawrence, Alejandra Barna, NP [facility]    Judeth HornErin Zabria Liss, NP

## 2018-07-19 ENCOUNTER — Other Ambulatory Visit: Payer: Self-pay

## 2018-07-19 DIAGNOSIS — K117 Disturbances of salivary secretion: Secondary | ICD-10-CM

## 2018-07-19 MED ORDER — GLYCOPYRROLATE 1 MG PO TABS: 1.0000 mg | ORAL_TABLET | Freq: Three times a day (TID) | ORAL | 0 refills | Status: DC

## 2018-07-19 NOTE — Telephone Encounter (Signed)
[facility] sent over Rx for Glycopyrrolate 1mg  tablet, per protocol refilled pt rx.

## 2018-08-11 NOTE — L&D Delivery Note (Addendum)
Obstetrical Delivery Note   Date of Delivery:   11/10/2018 Primary OB:   [facility] for [facility] Gestational Age/EDD: [redacted]w[redacted]d Reason for Admission: IOL for eclampsia, PRES, atypical HELLP Antepartum complications: Chronic HTN (no meds)  Delivered By:   Cornelia Copa MD  Delivery Type:   forceps, low  Delivery Details:   CTSP for deep variables with contractions and with pushing. Fetus LOA, +2 for the BPD, pelvis adequate and EFW 2500gm. D/w pt re: r/b/a to FAVD, namely possibly larger perineal laceration, need for stat c-section, fetal bruising and pt amenable. Epidural bolused by anethesia and then Jannifer Hick Luikart forceps applied without difficulty. FHR in the 80s-90s so patient instructed to pushed and over approximately 3-4 pushes, fetus brought to +4 and then forceps disarticulated and removed. Patient pushed fetus out and was DOP. Cord cut and clamped and handed to awaiting peds team. Rectal exam done and 2nd degree noted with sulcal lacerations. At this point, Dr. Macon Large stepped in for repair so I could attend to another delivery. Anesthesia:    epidural Intrapartum complications: Deep variables in 2nd stage and with pushing. GBS:    Unknown. Patient received at least two doses of PCN prior to delivery Laceration:    2nd degree. Per Dr. Macon Large: Usual fashion, 2-0 Vicrul running interlocking stitch from apex (was about 2 cm from posterior fornix) then normal fashion.  Episiotomy:    none Rectal exam:   Negative Placenta:    Delivered and expressed via active management. Intact: yes. To pathology: yes.  Delayed Cord Clamping: no Estimated Blood Loss:   Baby:    Liveborn female, APGARs 3/7, weight 2200gm Cord gases: Results for ROSHANI, DEMO [facility] (MRN [id number]) as of 11/15/2018 08:05  Ref. Range 11/10/2018 07:10 11/10/2018 07:15  pH cord blood (arterial) Latest Ref Range: 7.210 - 7.380  7.063 (LL)   pCO2 cord blood (arterial) Latest Ref Range:  42.0 - 56.0 mmHg 76.6 (H)   Bicarbonate Latest Ref Range: 13.0 - 22.0 mmol/L 20.8 17.6  Ph Cord Blood (Venous) Latest Ref Range: 7.240 - 7.380   7.161 (LL)  pCO2 Cord Blood (Venous) Latest Ref Range: 42.0 - 56.0   51.4    Cornelia Copa. MD Attending [facility])

## 2018-08-12 ENCOUNTER — Encounter (HOSPITAL_COMMUNITY): Payer: Self-pay

## 2018-08-12 ENCOUNTER — Inpatient Hospital Stay (HOSPITAL_COMMUNITY)
Admission: AD | Admit: 2018-08-12 | Discharge: 2018-08-13 | Disposition: A | Discharge status: Home / Self Care | Payer: Medicaid Other | Attending: Obstetrics & Gynecology | Admitting: Obstetrics & Gynecology

## 2018-08-12 DIAGNOSIS — O26899 Other specified pregnancy related conditions, unspecified trimester: Secondary | ICD-10-CM

## 2018-08-12 DIAGNOSIS — K2900 Acute gastritis without bleeding: Secondary | ICD-10-CM

## 2018-08-12 DIAGNOSIS — E860 Dehydration: Secondary | ICD-10-CM | POA: Diagnosis not present

## 2018-08-12 DIAGNOSIS — O26892 Other specified pregnancy related conditions, second trimester: Secondary | ICD-10-CM | POA: Diagnosis not present

## 2018-08-12 DIAGNOSIS — O219 Vomiting of pregnancy, unspecified: Secondary | ICD-10-CM | POA: Diagnosis present

## 2018-08-12 DIAGNOSIS — Z886 Allergy status to analgesic agent status: Secondary | ICD-10-CM | POA: Insufficient documentation

## 2018-08-12 DIAGNOSIS — Z3A00 Weeks of gestation of pregnancy not specified: Secondary | ICD-10-CM | POA: Insufficient documentation

## 2018-08-12 DIAGNOSIS — R12 Heartburn: Secondary | ICD-10-CM

## 2018-08-12 DIAGNOSIS — Z79899 Other long term (current) drug therapy: Secondary | ICD-10-CM | POA: Insufficient documentation

## 2018-08-12 DIAGNOSIS — Z3480 Encounter for supervision of other normal pregnancy, unspecified trimester: Secondary | ICD-10-CM

## 2018-08-12 DIAGNOSIS — O99210 Obesity complicating pregnancy, unspecified trimester: Secondary | ICD-10-CM

## 2018-08-12 LAB — URINALYSIS, ROUTINE W REFLEX MICROSCOPIC
BILIRUBIN URINE: NEGATIVE
GLUCOSE, UA: NEGATIVE mg/dL
KETONES UR: 20 mg/dL — AB
NITRITE: NEGATIVE
PH: 6 (ref 5.0–8.0)
Protein, ur: 100 mg/dL — AB
Specific Gravity, Urine: 1.026 (ref 1.005–1.030)
Squamous Epithelial / LPF: >50 — ABNORMAL HIGH (ref 0–5)

## 2018-08-12 MED ORDER — LACTATED RINGERS IV BOLUS
1000.0000 mL | Freq: Once | INTRAVENOUS | Status: AC
  Administered 2018-08-12: 1000 mL via INTRAVENOUS

## 2018-08-12 MED ORDER — ONDANSETRON HCL 4 MG/2ML IJ SOLN
4.0000 mg | Freq: Once | INTRAMUSCULAR | Status: AC
  Administered 2018-08-12: 4 mg via INTRAVENOUS
  Filled 2018-08-12: qty 2

## 2018-08-12 MED ORDER — ACETAMINOPHEN 500 MG PO TABS
1000.0000 mg | ORAL_TABLET | Freq: Once | ORAL | Status: AC
  Administered 2018-08-12: 1000 mg via ORAL
  Filled 2018-08-12: qty 2

## 2018-08-12 NOTE — MAU Provider Note (Signed)
History     CSN: [id number]  Arrival date and time: 08/12/18 2204   First Provider Initiated Contact with Patient 08/12/18 2253      Chief Complaint  Patient presents with  . Emesis   Karen Booth presents for vomiting and fever Vomiting has been ongoing since early in pregnancy, but worse over the past 3 days Patient reports that she hasn't been able to keep anything down for 3 days Fever started earlier today Also has severe heart burn Denies pain or burning with urination, pelvic pain or pressure Denies vaginal itching, burning or pain Denies shortness of breath, nasal congestion, flu-like symptoms Reports sore throat and back pain, both of which she believes is secondary to vomiting   Past Medical History:  Diagnosis Date  . Heart murmur   . Urinary tract infection     Past Surgical History:  Procedure Laterality Date  . TONSILLECTOMY      Family History  Problem Relation Age of Onset  . Heart disease Mother        murmur, arrythmia  . Hypertension Mother   . Heart murmur Mother   . Arrhythmia Mother   . Hypertension Father   . Breast cancer Maternal Grandmother        and g-gma  . Breast cancer Paternal Grandmother   . Esophageal cancer Maternal Uncle   . Diabetes Paternal Aunt   . Stomach cancer Maternal Uncle   . Hearing loss Neg Hx     Social History   Tobacco Use  . Smoking status: Never Smoker  . Smokeless tobacco: Never Used  . Tobacco comment: summer 2014  Substance Use Topics  . Alcohol use: No  . Drug use: Yes    Types: Marijuana    Comment: last use 30 May 2018    Allergies:  Allergies  Allergen Reactions  . Ibuprofen Anaphylaxis, Hives and Swelling    Oral swelling    Medications Prior to Admission  Medication Sig Dispense Refill Last Dose  . DICLEGIS 10-10 MG TBEC Take 2 tablets by mouth at bedtime. 60 tablet 3   . famotidine (PEPCID) 20 MG tablet Take 1 tablet (20 mg total) by mouth 2 (two) times daily. 60 tablet 3   .  glycopyrrolate (ROBINUL) 1 MG tablet Take 1 tablet (1 mg total) by mouth 3 (three) times daily. 90 tablet 0   . ondansetron (ZOFRAN ODT) 4 MG disintegrating tablet Take 1 tablet (4 mg total) by mouth every 8 (eight) hours as needed for nausea or vomiting. 30 tablet 2   . Prenatal Vit-Fe Fumarate-FA (PRENATAL MULTIVITAMIN) TABS tablet Take 1 tablet by mouth daily at 12 noon.   Taking    Review of Systems  All other systems reviewed and are negative.  Physical Exam   Blood pressure 118/87, pulse (!) 132, temperature 98.4 F (36.9 C), temperature source Axillary, resp. rate 19, height 5\' 1"  (1.549 m), weight 74.7 kg, last menstrual period 03/01/2018.  Physical Exam  Constitutional: She is oriented to person, place, and time. She appears well-developed and well-nourished. No distress.  HENT:  Head: Normocephalic and atraumatic.  Mouth/Throat: Oropharynx is clear and moist.  Eyes: Pupils are equal, round, and reactive to light. Conjunctivae and EOM are normal. Right eye exhibits no discharge. Left eye exhibits no discharge. No scleral icterus.  Cardiovascular: Normal rate, regular rhythm and normal heart sounds. Exam reveals no gallop and no friction rub.  No murmur heard. Respiratory: Effort normal and breath sounds normal. No respiratory  distress. She has no wheezes.  GI: Soft. There is no abdominal tenderness.  No CVA tenderness  Neurological: She is alert and oriented to person, place, and time.  Skin: She is not diaphoretic.  Psychiatric: She has a normal mood and affect. Her behavior is normal. Judgment and thought content normal.    MAU Course  Procedures  MDM Initially febrile and tachycardic Treated with IV fluids, antipyretic, antiemetic and acid reducer with eventual improvement in symptoms Tolerating PO and comfortable going home at the time of discharge  Assessment and Plan  Nausea and vomiting in pregnancy Heartburn during pregnancy in second trimester Antepartum  dehydration Other acute gastritis without hemorrhage - s/p 3L IV fluids  - switch antiacid to protonix - encourage hydration and bland diet to avoid exacerbation of acid reflux - follow up as scheduled in the office today or PRN for return precautions - patient is agreeable to discharge   Karen Booth 08/12/2018, 11:23 PM

## 2018-08-12 NOTE — MAU Note (Signed)
PT   SPITS - GAVE  MED- SAYS DOESN'T  HELP.   SAYS VOMITING X3 DAYS . [facility]-  CLINIC .  HAS AN APPOINTMENT TOMORROW.     NOONE IN HOUSE SICK.    NO DIARRHEA.

## 2018-08-13 ENCOUNTER — Ambulatory Visit (HOSPITAL_COMMUNITY)
Admission: RE | Admit: 2018-08-13 | Discharge: 2018-08-13 | Disposition: A | Discharge status: Home / Self Care | Payer: Medicaid Other | Source: Ambulatory Visit | Attending: Obstetrics & Gynecology | Admitting: Obstetrics & Gynecology

## 2018-08-13 ENCOUNTER — Ambulatory Visit (INDEPENDENT_AMBULATORY_CARE_PROVIDER_SITE_OTHER): Payer: Medicaid Other | Admitting: Student

## 2018-08-13 VITALS — BP 120/79 | HR 120 | Wt 173.4 lb

## 2018-08-13 DIAGNOSIS — E860 Dehydration: Secondary | ICD-10-CM

## 2018-08-13 DIAGNOSIS — O219 Vomiting of pregnancy, unspecified: Secondary | ICD-10-CM

## 2018-08-13 DIAGNOSIS — Z3A23 23 weeks gestation of pregnancy: Secondary | ICD-10-CM

## 2018-08-13 DIAGNOSIS — R12 Heartburn: Secondary | ICD-10-CM

## 2018-08-13 DIAGNOSIS — Z3A24 24 weeks gestation of pregnancy: Secondary | ICD-10-CM

## 2018-08-13 DIAGNOSIS — Z3480 Encounter for supervision of other normal pregnancy, unspecified trimester: Secondary | ICD-10-CM

## 2018-08-13 DIAGNOSIS — Z362 Encounter for other antenatal screening follow-up: Secondary | ICD-10-CM | POA: Diagnosis present

## 2018-08-13 DIAGNOSIS — O26892 Other specified pregnancy related conditions, second trimester: Secondary | ICD-10-CM

## 2018-08-13 DIAGNOSIS — K2900 Acute gastritis without bleeding: Secondary | ICD-10-CM

## 2018-08-13 DIAGNOSIS — O99210 Obesity complicating pregnancy, unspecified trimester: Secondary | ICD-10-CM

## 2018-08-13 DIAGNOSIS — Z3482 Encounter for supervision of other normal pregnancy, second trimester: Secondary | ICD-10-CM

## 2018-08-13 LAB — CBC
HCT: 33.5 % — ABNORMAL LOW (ref 36.0–46.0)
Hemoglobin: 11.3 g/dL — ABNORMAL LOW (ref 12.0–15.0)
MCH: 28.2 pg (ref 26.0–34.0)
MCHC: 33.7 g/dL (ref 30.0–36.0)
MCV: 83.5 fL (ref 80.0–100.0)
NRBC: 0 % (ref 0.0–0.2)
Platelets: 212 10*3/uL (ref 150–400)
RBC: 4.01 MIL/uL (ref 3.87–5.11)
RDW: 14 % (ref 11.5–15.5)
WBC: 10.5 10*3/uL (ref 4.0–10.5)

## 2018-08-13 LAB — COMPREHENSIVE METABOLIC PANEL
ALBUMIN: 3.5 g/dL (ref 3.5–5.0)
ALT: 16 U/L (ref 0–44)
AST: 17 U/L (ref 15–41)
Alkaline Phosphatase: 47 U/L (ref 38–126)
Anion gap: 9 (ref 5–15)
BUN: 14 mg/dL (ref 6–20)
CHLORIDE: 103 mmol/L (ref 98–111)
CO2: 24 mmol/L (ref 22–32)
CREATININE: 0.63 mg/dL (ref 0.44–1.00)
Calcium: 8.7 mg/dL — ABNORMAL LOW (ref 8.9–10.3)
GFR calc Af Amer: >60 mL/min (ref >60)
GLUCOSE: 83 mg/dL (ref 70–99)
Potassium: 3.4 mmol/L — ABNORMAL LOW (ref 3.5–5.1)
Sodium: 136 mmol/L (ref 135–145)
Total Bilirubin: 0.6 mg/dL (ref 0.3–1.2)
Total Protein: 6.9 g/dL (ref 6.5–8.1)

## 2018-08-13 MED ORDER — FAMOTIDINE IN NACL 20-0.9 MG/50ML-% IV SOLN
20.0000 mg | Freq: Once | INTRAVENOUS | Status: AC
  Administered 2018-08-13: 20 mg via INTRAVENOUS
  Filled 2018-08-13: qty 50

## 2018-08-13 MED ORDER — PRENATAL ADULT GUMMY/DHA/FA 0.4-25 MG PO CHEW: 1.0000 [IU] | CHEWABLE_TABLET | Freq: Every day | ORAL | 8 refills | Status: DC

## 2018-08-13 MED ORDER — PANTOPRAZOLE SODIUM 20 MG PO TBEC: 20.0000 mg | DELAYED_RELEASE_TABLET | ORAL | 3 refills | Status: DC

## 2018-08-13 MED ORDER — PANTOPRAZOLE SODIUM 40 MG IV SOLR
20.0000 mg | Freq: Once | INTRAVENOUS | Status: AC
  Administered 2018-08-13: 20 mg via INTRAVENOUS
  Filled 2018-08-13: qty 20

## 2018-08-13 MED ORDER — DEXTROSE IN LACTATED RINGERS 5 % IV SOLN
INTRAVENOUS | Status: DC
  Administered 2018-08-13: 03:00:00 via INTRAVENOUS

## 2018-08-13 MED ORDER — LACTATED RINGERS IV BOLUS
1000.0000 mL | Freq: Once | INTRAVENOUS | Status: AC
  Administered 2018-08-13: 1000 mL via INTRAVENOUS

## 2018-08-13 NOTE — Patient Instructions (Signed)
Heartburn During Pregnancy Heartburn is a type of pain or discomfort that can happen in the throat or chest. It is often described as a burning sensation. Heartburn is common during pregnancy because:  A hormone (progesterone) that is released during pregnancy may relax the valve (lower esophageal sphincter, or LES) that separates the esophagus from the stomach. This allows stomach acid to move up into the esophagus, causing heartburn.  The uterus gets larger and pushes up on the stomach, which pushes more acid into the esophagus. This is especially true in the later stages of pregnancy. Heartburn usually goes away or gets better after giving birth. What are the causes? Heartburn is caused by stomach acid backing up into the esophagus (reflux). Reflux can be triggered by:  Changing hormone levels.  Large meals.  Certain foods and beverages, such as coffee, chocolate, onions, and peppermint.  Exercise.  Increased stomach acid production. What increases the risk? You are more likely to experience heartburn during pregnancy if you:  Had heartburn prior to becoming pregnant.  Have been pregnant more than once before.  Are overweight or obese. The likelihood that you will get heartburn also increases as you get farther along in your pregnancy, especially during the last trimester. What are the signs or symptoms? Symptoms of this condition include:  Burning pain in the chest or lower throat.  Bitter taste in the mouth.  Coughing.  Problems swallowing.  Vomiting.  Hoarse voice.  Asthma. Symptoms may get worse when you lie down or bend over. Symptoms are often worse at night. How is this diagnosed? This condition is diagnosed based on:  Your medical history.  Your symptoms.  Blood tests to check for a certain type of bacteria associated with heartburn.  Whether taking heartburn medicine relieves your symptoms.  Examination of the stomach and esophagus using a tube with  a light and camera on the end (endoscopy). How is this treated? Treatment varies depending on how severe your symptoms are. Your health care provider may recommend:  Over-the-counter medicines (antacids or acid reducers) for mild heartburn.  Prescription medicines to decrease stomach acid or to protect your stomach lining.  Certain changes in your diet.  Raising the head of your bed so it is higher than the foot of the bed. This helps prevent stomach acid from backing up into the esophagus when you are lying down. Follow these instructions at home: Eating and drinking  Do not drink alcohol during your pregnancy.  Identify foods and beverages that make your symptoms worse, and avoid them.  Beverages that you may want to avoid include: ? Coffee and tea (with or without caffeine). ? Energy drinks and sports drinks. ? Carbonated drinks or sodas. ? Citrus fruit juices.  Foods that you may want to avoid include: ? Chocolate and cocoa. ? Peppermint and mint flavorings. ? Garlic, onions, and horseradish. ? Spicy and acidic foods, including peppers, chili powder, curry powder, vinegar, hot sauces, and barbecue sauce. ? Citrus fruits, such as oranges, lemons, and limes. ? Tomato-based foods, such as red sauce, chili, and salsa. ? Fried and fatty foods, such as donuts, french fries, potato chips, and high-fat dressings. ? High-fat meats, such as hot dogs, cold cuts, sausage, ham, and bacon. ? High-fat dairy items, such as whole milk, butter, and cheese.  Eat small, frequent meals instead of large meals.  Avoid drinking large amounts of liquid with your meals.  Avoid eating meals during the 2-3 hours before bedtime.  Avoid lying down right   after you eat.  Do not exercise right after you eat. Medicines  Take over-the-counter and prescription medicines only as told by your health care provider.  Do not take aspirin, ibuprofen, or other NSAIDs unless your health care provider tells  you to do that.  You may be instructed to avoid medicines that contain sodium bicarbonate. General instructions   If directed, raise the head of your bed about 6 inches (15 cm) by putting blocks under the legs. Sleeping with more pillows does not effectively relieve heartburn because it only changes the position of your head.  Do not use any products that contain nicotine or tobacco, such as cigarettes and e-cigarettes. If you need help quitting, ask your health care provider.  Wear loose-fitting clothing.  Try to reduce your stress, such as with yoga or meditation. If you need help managing stress, ask your health care provider.  Maintain a healthy weight. If you are overweight, work with your health care provider to safely lose weight.  Keep all follow-up visits as told by your health care provider. This is important. Contact a health care provider if:  You develop new symptoms.  Your symptoms do not improve with treatment.  You have unexplained weight loss.  You have difficulty swallowing.  You make loud sounds when you breathe (wheeze).  You have a cough that does not go away.  You have frequent heartburn for more than 2 weeks.  You have nausea or vomiting that does not get better with treatment.  You have pain in your abdomen. Get help right away if:  You have severe chest pain that spreads to your arm, neck, or jaw.  You feel sweaty, dizzy, or light-headed.  You have shortness of breath.  You have pain when swallowing.  You vomit, and your vomit looks like blood or coffee grounds.  Your stool is bloody or black. This information is not intended to replace advice given to you by your health care provider. Make sure you discuss any questions you have with your health care provider. Document Released: 07/25/2000 Document Revised: 04/14/2016 Document Reviewed: 04/14/2016 Elsevier Interactive Patient Education  2019 [employer]    Safe Medications in  Pregnancy   Acne: Benzoyl Peroxide Salicylic Acid  Backache/Headache: Tylenol: 2 regular strength every 4 hours OR              2 Extra strength every 6 hours  Colds/Coughs/Allergies: Benadryl (alcohol free) 25 mg every 6 hours as needed Breath right strips Claritin Cepacol throat lozenges Chloraseptic throat spray Cold-Eeze- up to three times per day Cough drops, alcohol free Flonase (by prescription only) Guaifenesin Mucinex Robitussin DM (plain only, alcohol free) Saline nasal spray/drops Sudafed (pseudoephedrine) & Actifed ** use only after [redacted] weeks gestation and if you do not have high blood pressure Tylenol Vicks Vaporub Zinc lozenges Zyrtec   Constipation: Colace Ducolax suppositories Fleet enema Glycerin suppositories Metamucil Milk of magnesia Miralax Senokot Smooth move tea  Diarrhea: Kaopectate Imodium A-D  *NO pepto Bismol  Hemorrhoids: Anusol Anusol HC Preparation H Tucks  Indigestion: Tums Maalox Mylanta Zantac  Pepcid  Insomnia: Benadryl (alcohol free) 25mg  every 6 hours as needed Tylenol PM Unisom, no Gelcaps  Leg Cramps: Tums MagGel  Nausea/Vomiting:  Bonine Dramamine Emetrol Ginger extract Sea bands Meclizine  Nausea medication to take during pregnancy:  Unisom (doxylamine succinate 25 mg tablets) Take one tablet daily at bedtime. If symptoms are not adequately controlled, the dose can be increased to a maximum recommended dose of two  tablets daily (1/2 tablet in the morning, 1/2 tablet mid-afternoon and one at bedtime). Vitamin B6 100mg  tablets. Take one tablet twice a day (up to 200 mg per day).  Skin Rashes: Aveeno products Benadryl cream or 25mg  every 6 hours as needed Calamine Lotion 1% cortisone cream  Yeast infection: Gyne-lotrimin 7 Monistat 7  Gum/tooth pain: Anbesol  **If taking multiple medications, please check labels to avoid duplicating the same active ingredients **take medication as directed  on the label ** Do not exceed 4000 mg of tylenol in 24 hours **Do not take medications that contain aspirin or ibuprofen

## 2018-08-13 NOTE — Progress Notes (Signed)
   PRENATAL VISIT NOTE  Subjective:  Karen Booth is a 23 y.o. G2P1001 at 5953w4d being seen today for ongoing prenatal care.  She is currently monitored for the following issues for this low-risk pregnancy and has Supervision of other normal pregnancy, antepartum and Obesity in pregnancy on their problem list.  Patient reports no complaints.  Contractions: Not present. Vag. Bleeding: None.  Movement: Present. Denies leaking of fluid.  Was seen in MAU last night for vomiting. Reports vomiting for the last few days. Dx with exacerbation of heartburn & was started on protonix. Pt requesting prenatal gummy vitamins as she can't tolerate the pills.   The following portions of the patient's history were reviewed and updated as appropriate: allergies, current medications, past family history, past medical history, past social history, past surgical history and problem list. Problem list updated.  Objective:   Vitals:   08/13/18 1006  BP: 120/79  Pulse: (!) 120  Weight: 173 lb 6.4 oz (78.7 kg)    Fetal Status: Fetal Heart Rate (bpm): 154 Fundal Height: 24 cm Movement: Present     General:  Alert, oriented and cooperative. Patient is in no acute distress.  Skin: Skin is warm and dry. No rash noted.   Cardiovascular:  tachycardia. Normal rhythm  Respiratory: Normal respiratory effort, no problems with respiration noted  Abdomen: Soft, gravid, appropriate for gestational age.  Pain/Pressure: Absent     Pelvic: Cervical exam deferred        Extremities: Normal range of motion.  Edema: None  Mental Status: Normal mood and affect. Normal behavior. Normal judgment and thought content.   Assessment and Plan:  Pregnancy: G2P1001 at 7553w4d  1. Supervision of other normal pregnancy, antepartum -ultrasound today to complete anatomy -discussed upcoming appointment & 28 wk labs -declines AFP - Prenatal MV & Min w/FA-DHA (PRENATAL ADULT GUMMY/DHA/FA) 0.4-25 MG CHEW; Chew 1 Units by mouth daily.  Dispense:  30 tablet; Refill: 8  Preterm labor symptoms and general obstetric precautions including but not limited to vaginal bleeding, contractions, leaking of fluid and fetal movement were reviewed in detail with the patient. Please refer to After Visit Summary for other counseling recommendations.  Return in about 4 weeks (around 09/10/2018) for Routine OB & fasting 28 wk labs.  Future Appointments  Date Time Provider Department Center  08/13/2018 10:45 AM WH-MFC US 2 WH-MFCUS MFC-US  09/08/2018  8:15 AM Marny LowensteinWenzel, Julie N, PA-C [facility] WOC  09/08/2018  8:50 AM WOC-W[facility]A LAB [facility] WOC    Judeth HornErin Emylee Decelle, NP

## 2018-08-14 LAB — URINE CULTURE: Culture: 80000 — AB

## 2018-09-06 ENCOUNTER — Other Ambulatory Visit: Payer: Self-pay | Admitting: *Deleted

## 2018-09-06 DIAGNOSIS — O99210 Obesity complicating pregnancy, unspecified trimester: Secondary | ICD-10-CM

## 2018-09-06 DIAGNOSIS — Z3480 Encounter for supervision of other normal pregnancy, unspecified trimester: Secondary | ICD-10-CM

## 2018-09-08 ENCOUNTER — Ambulatory Visit (INDEPENDENT_AMBULATORY_CARE_PROVIDER_SITE_OTHER): Payer: Medicaid Other | Admitting: Medical

## 2018-09-08 ENCOUNTER — Other Ambulatory Visit: Payer: Medicaid Other

## 2018-09-08 VITALS — BP 124/71 | HR 95 | Wt 175.1 lb

## 2018-09-08 DIAGNOSIS — Z3482 Encounter for supervision of other normal pregnancy, second trimester: Secondary | ICD-10-CM | POA: Diagnosis present

## 2018-09-08 DIAGNOSIS — Z23 Encounter for immunization: Secondary | ICD-10-CM | POA: Diagnosis not present

## 2018-09-08 DIAGNOSIS — O99212 Obesity complicating pregnancy, second trimester: Secondary | ICD-10-CM | POA: Diagnosis not present

## 2018-09-08 DIAGNOSIS — Z3480 Encounter for supervision of other normal pregnancy, unspecified trimester: Secondary | ICD-10-CM

## 2018-09-08 DIAGNOSIS — O99210 Obesity complicating pregnancy, unspecified trimester: Secondary | ICD-10-CM

## 2018-09-08 NOTE — Progress Notes (Signed)
   PRENATAL VISIT NOTE  Subjective:  Karen Booth is a 23 y.o. G2P1001 at [redacted]w[redacted]d being seen today for ongoing prenatal care.  She is currently monitored for the following issues for this low-risk pregnancy and has Supervision of other normal pregnancy, antepartum and Obesity in pregnancy on their problem list.  Patient reports no complaints.  Contractions: Not present. Vag. Bleeding: None.  Movement: Present. Denies leaking of fluid.   The following portions of the patient's history were reviewed and updated as appropriate: allergies, current medications, past family history, past medical history, past social history, past surgical history and problem list. Problem list updated.  Objective:   Vitals:   09/08/18 0824  BP: 124/71  Pulse: 95  Weight: 175 lb 1.6 oz (79.4 kg)    Fetal Status: Fetal Heart Rate (bpm): 140 Fundal Height: 29 cm Movement: Present     General:  Alert, oriented and cooperative. Patient is in no acute distress.  Skin: Skin is warm and dry. No rash noted.   Cardiovascular: Normal heart rate noted  Respiratory: Normal respiratory effort, no problems with respiration noted  Abdomen: Soft, gravid, appropriate for gestational age.  Pain/Pressure: Absent     Pelvic: Cervical exam deferred        Extremities: Normal range of motion.  Edema: None  Mental Status: Normal mood and affect. Normal behavior. Normal judgment and thought content.   Assessment and Plan:  Pregnancy: G2P1001 at [redacted]w[redacted]d  1. Supervision of other normal pregnancy, antepartum - CBC, HIV and RPR drawn today - Patient not fasting, will return for 2 hour GTT next week - Tdap vaccine greater than or equal to 7yo IM  2. Obesity in pregnancy   Preterm labor symptoms and general obstetric precautions including but not limited to vaginal bleeding, contractions, leaking of fluid and fetal movement were reviewed in detail with the patient. Please refer to After Visit Summary for other counseling  recommendations.  Return in about 2 weeks (around 09/22/2018) for LOB.   Vonzella Nipple, PA-C

## 2018-09-08 NOTE — Progress Notes (Signed)
Had eaten today, will do 28 wk labs except 2 hr gtt, will come back for that.

## 2018-09-08 NOTE — Patient Instructions (Addendum)
Second Trimester of Pregnancy  The second trimester is from week 14 through week 27 (month 4 through 6). This is often the time in pregnancy that you feel your best. Often times, morning sickness has lessened or quit. You may have more energy, and you may get hungry more often. Your unborn baby is growing rapidly. At the end of the sixth month, he or she is about 9 inches long and weighs about 1 pounds. You will likely feel the baby move between 18 and 20 weeks of pregnancy. Follow these instructions at home: Medicines  Take over-the-counter and prescription medicines only as told by your doctor. Some medicines are safe and some medicines are not safe during pregnancy.  Take a prenatal vitamin that contains at least 600 micrograms (mcg) of folic acid.  If you have trouble pooping (constipation), take medicine that will make your stool soft (stool softener) if your doctor approves. Eating and drinking   Eat regular, healthy meals.  Avoid raw meat and uncooked cheese.  If you get low calcium from the food you eat, talk to your doctor about taking a daily calcium supplement.  Avoid foods that are high in fat and sugars, such as fried and sweet foods.  If you feel sick to your stomach (nauseous) or throw up (vomit): ? Eat 4 or 5 small meals a day instead of 3 large meals. ? Try eating a few soda crackers. ? Drink liquids between meals instead of during meals.  To prevent constipation: ? Eat foods that are high in fiber, like fresh fruits and vegetables, whole grains, and beans. ? Drink enough fluids to keep your pee (urine) clear or pale yellow. Activity  Exercise only as told by your doctor. Stop exercising if you start to have cramps.  Do not exercise if it is too hot, too humid, or if you are in a place of great height (high altitude).  Avoid heavy lifting.  Wear low-heeled shoes. Sit and stand up straight.  You can continue to have sex unless your doctor tells you not  to. Relieving pain and discomfort  Wear a good support bra if your breasts are tender.  Take warm water baths (sitz baths) to soothe pain or discomfort caused by hemorrhoids. Use hemorrhoid cream if your doctor approves.  Rest with your legs raised if you have leg cramps or low back pain.  If you develop puffy, bulging veins (varicose veins) in your legs: ? Wear support hose or compression stockings as told by your doctor. ? Raise (elevate) your feet for 15 minutes, 3-4 times a day. ? Limit salt in your food. Prenatal care  Write down your questions. Take them to your prenatal visits.  Keep all your prenatal visits as told by your doctor. This is important. Safety  Wear your seat belt when driving.  Make a list of emergency phone numbers, including numbers for family, friends, the hospital, and police and fire departments. General instructions  Ask your doctor about the right foods to eat or for help finding a counselor, if you need these services.  Ask your doctor about local prenatal classes. Begin classes before month 6 of your pregnancy.  Do not use hot tubs, steam rooms, or saunas.  Do not douche or use tampons or scented sanitary pads.  Do not cross your legs for long periods of time.  Visit your dentist if you have not done so. Use a soft toothbrush to brush your teeth. Floss gently.  Avoid all smoking, herbs,   and alcohol. Avoid drugs that are not approved by your doctor.  Do not use any products that contain nicotine or tobacco, such as cigarettes and e-cigarettes. If you need help quitting, ask your doctor.  Avoid cat litter boxes and soil used by cats. These carry germs that can cause birth defects in the baby and can cause a loss of your baby (miscarriage) or stillbirth. Contact a doctor if:  You have mild cramps or pressure in your lower belly.  You have pain when you pee (urinate).  You have bad smelling fluid coming from your vagina.  You continue to  feel sick to your stomach (nauseous), throw up (vomit), or have watery poop (diarrhea).  You have a nagging pain in your belly area.  You feel dizzy. Get help right away if:  You have a fever.  You are leaking fluid from your vagina.  You have spotting or bleeding from your vagina.  You have severe belly cramping or pain.  You lose or gain weight rapidly.  You have trouble catching your breath and have chest pain.  You notice sudden or extreme puffiness (swelling) of your face, hands, ankles, feet, or legs.  You have not felt the baby move in over an hour.  You have severe headaches that do not go away when you take medicine.  You have trouble seeing. Summary  The second trimester is from week 14 through week 27 (months 4 through 6). This is often the time in pregnancy that you feel your best.  To take care of yourself and your unborn baby, you will need to eat healthy meals, take medicines only if your doctor tells you to do so, and do activities that are safe for you and your baby.  Call your doctor if you get sick or if you notice anything unusual about your pregnancy. Also, call your doctor if you need help with the right food to eat, or if you want to know what activities are safe for you. This information is not intended to replace advice given to you by your health care provider. Make sure you discuss any questions you have with your health care provider. Document Released: 10/22/2009 Document Revised: 09/02/2016 Document Reviewed: 09/02/2016 Elsevier Interactive Patient Education  2019 [place] Education Options: [facility] Department Classes:  Childbirth education classes can help you get ready for a positive parenting experience. You can also meet other expectant parents and get free stuff for your baby. Each class runs for five weeks on the same night and costs $45 for the mother-to-be and her support person. Medicaid covers the cost if  you are eligible. Call [phone] to register. [facility] Childbirth Education:  [phone] or [phone] or [email or web address]  Baby & Me Class: Discuss newborn & infant parenting and family adjustment issues with other new mothers in a relaxed environment. Each week brings a new speaker or baby-centered activity. We encourage new mothers to join [place] every Thursday at 11:00am. Babies birth until crawling. No registration or fee. Daddy [employer]: This course offers Dads-to-be the tools and knowledge needed to feel confident on their journey to becoming new fathers. Experienced dads, who have been trained as coaches, teach dads-to-be how to hold, comfort, diaper, swaddle and play with their infant while being able to support the new mom as well. A class for men taught by men. $25/dad Big Brother/Big Sister: Let your children share in the joy of a new brother or sister in this special class  designed just for them. Class includes discussion about how families care for babies: swaddling, holding, diapering, safety as well as how they can be helpful in their new role. This class is designed for children ages 9 to 35, but any age is welcome. Please register each child individually. $5/child  Mom Talk: This mom-led group offers support and connection to mothers as they journey through the adjustments and struggles of that sometimes overwhelming first year after the birth of a child. Tuesdays at 10:00am and Thursdays at 6:00pm. Babies welcome. No registration or fee. Breastfeeding Support Group: This group is a mother-to-mother support circle where moms have the opportunity to share their breastfeeding experiences. A Lactation Consultant is present for questions and concerns. Meets each Tuesday at 11:00am. No fee or registration. Breastfeeding Your Baby: Learn what to expect in the first days of breastfeeding your newborn.  This class will help you feel more confident with the skills needed to  begin your breastfeeding experience. Many new mothers are concerned about breastfeeding after leaving the hospital. This class will also address the most common fears and challenges about breastfeeding during the first few weeks, months and beyond. (call for fee) Comfort Techniques and Tour: This 2 hour interactive class will provide you the opportunity to learn & practice hands-on techniques that can help relieve some of the discomfort of labor and encourage your baby to rotate toward the best position for birth. You and your partner will be able to try a variety of labor positions with birth balls and rebozos as well as practice breathing, relaxation, and visualization techniques. A tour of the [facility] is included with this class. $20 per registrant and support person Childbirth Class- Weekend Option: This class is a Weekend version of our Birth & Baby series. It is designed for parents who have a difficult time fitting several weeks of classes into their schedule. It covers the care of your newborn and the basics of labor and childbirth. It also includes a [place] [facility] and lunch. The class is held two consecutive days: beginning on Friday evening from 6:30 - 8:30 p.m. and the next day, Saturday from 9 a.m. - 4 p.m. (call for fee) Doren Custard Class: Interested in a waterbirth?  This informational class will help you discover whether waterbirth is the right fit for you. Education about waterbirth itself, supplies you would need and how to assemble your support team is what you can expect from this class. Some obstetrical practices require this class in order to pursue a waterbirth. (Not all obstetrical practices offer waterbirth-check with your healthcare provider.) Register only the expectant mom, but you are encouraged to bring your partner to class! Required if planning waterbirth, no fee. Infant/Child CPR: Parents, grandparents, babysitters,  and friends learn Cardio-Pulmonary Resuscitation skills for infants and children. You will also learn how to treat both conscious and unconscious choking in infants and children. This Family & Friends program does not offer certification. Register each participant individually to ensure that enough mannequins are available. (Call for fee) Grandparent Love: Expecting a grandbaby? This class is for you! Learn about the latest infant care and safety recommendations and ways to support your own child as he or she transitions into the parenting role. Taught by Registered Nurses who are childbirth instructors, but most importantly...they are grandmothers too! $10/person. Childbirth Class- Natural Childbirth: This series of 5 weekly classes is for expectant parents who want to learn and practice natural methods of coping  with the process of labor and childbirth. Relaxation, breathing, massage, visualization, role of the partner, and helpful positioning are highlighted. Participants learn how to be confident in their body's ability to give birth. This class will empower and help parents make informed decisions about their own care. Includes discussion that will help new parents transition into the immediate postpartum period. [facility] is included. We suggest taking this class between 25-32 weeks, but it's only a recommendation. $75 per registrant and one support person or $30 Medicaid. Childbirth Class- 3 week Series: This option of 3 weekly classes helps you and your labor partner prepare for childbirth. Newborn care, labor & birth, cesarean birth, pain management, and comfort techniques are discussed and a [place] [facility] is included. The class meets at the same time, on the same day of the week for 3 consecutive weeks beginning with the starting date you choose. $60 for registrant and one support person.  Marvelous Multiples: Expecting twins,  triplets, or more? This class covers the differences in labor, birth, parenting, and breastfeeding issues that face multiples' parents. NICU tour is included. Led by a Certified Childbirth Educator who is the mother of twins. No fee. Caring for Baby: This class is for expectant and adoptive parents who want to learn and practice the most up-to-date newborn care for their babies. Focus is on birth through the first six weeks of life. Topics include feeding, bathing, diapering, crying, umbilical cord care, circumcision care and safe sleep. Parents learn to recognize symptoms of illness and when to call the pediatrician. Register only the mom-to-be and your partner or support person can plan to come with you! $10 per registrant and support person Childbirth Class- online option: This online class offers you the freedom to complete a Birth and Baby series in the comfort of your own home. The flexibility of this option allows you to review sections at your own pace, at times convenient to you and your support people. It includes additional video information, animations, quizzes, and extended activities. Get organized with helpful eClass tools, checklists, and trackers. Once you register online for the class, you will receive an email within a few days to accept the invitation and begin the class when the time is right for you. The content will be available to you for 60 days. $60 for 60 days of online access for you and your support people.  Local Doulas: Natural Baby Doulas [email or web address] Tel: [phone] [email or web address] [employer] [phone] [email or web address] The Labor [facility]im  (also do waterbirth tub rental) [phone] [email or web address] [email or web address] Doula [phone] [email or web address] [facility] Rhythms   [phone] [email or web address] [employer] (PADA) [email or web address] [place] Birth [facility] Baby  [email or web address] Considering Waterbirth? Guide for patients at [facility] [employer]  Why consider waterbirth?  . Gentle birth for babies . Less pain medicine used in labor . May allow for passive descent/less pushing . May reduce perineal tears  . More mobility and instinctive maternal position changes . Increased maternal relaxation . Reduced blood pressure in labor  Is waterbirth safe? What are the risks of infection, drowning or other complications?  . Infection: o Very low risk (3.7 % for tub vs 4.8% for bed) o 7 in 8000 waterbirths with documented infection o Poorly cleaned equipment most common cause o Slightly lower group B strep transmission rate  . Drowning o Maternal:  - Very low risk   - Related to seizures or fainting o  Newborn:  - Very low risk. No evidence of increased risk of respiratory problems in multiple large studies - Physiological protection from breathing under water - Avoid underwater birth if there are any fetal complications - Once baby's head is out of the water, keep it out.  . Birth complication o Some reports of cord trauma, but risk decreased by bringing baby to surface gradually o No evidence of increased risk of shoulder dystocia. Mothers can usually change positions faster in water than in a bed, possibly aiding the maneuvers to free the shoulder.   You must attend a Doren Custard class at [facility]  3rd Wednesday of every month from 7-9pm  [employer] by calling [phone] or online at [email or web address]  Bring [place] the certificate from the class to your prenatal appointment  Meet with a midwife at 36 weeks to see if you can still plan a waterbirth and to sign the consent.   Purchase or rent the following supplies:   Water  Birth Pool (Birth Pool in a Box or [place] for instance)  (Tubs start ~$125)  Single-use disposable tub liner designed for your brand of tub  New garden hose labeled "lead-free", "suitable for drinking water",  Electric drain pump to remove water (We recommend 792 gallon per hour or greater pump.)   Separate garden hose to remove the dirty water  Fish net  Bathing suit top (optional)  Long-handled mirror (optional)  Places to purchase or rent supplies  [email or web address].is for tub purchases and supplies  Waterbirthsolutions.com for tub purchases and supplies  The Labor Ladies ([email or web address]) $275 for tub rental/set-up & take down/kit   [employer] (http://www.fleming.com/.htm) Information regarding doulas (labor support) who provide pool rentals  Our practice has a Birth Pool in a Box tub at the hospital that you may borrow on a first-come-first-served basis. It is your responsibility to to set up, clean and break down the tub. We cannot guarantee the availability of this tub in advance. You are responsible for bringing all accessories listed above. If you do not have all necessary supplies you cannot have a waterbirth.    Things that would prevent you from having a waterbirth:  Premature, <37wks  Previous cesarean birth  Presence of thick meconium-stained fluid  Multiple gestation (Twins, triplets, etc.)  Uncontrolled diabetes or gestational diabetes requiring medication  Hypertension requiring medication or diagnosis of pre-eclampsia  Heavy vaginal bleeding  Non-reassuring fetal heart rate  Active infection (MRSA, etc.). Group B Strep is NOT a contraindication for  waterbirth.  If your labor has to be induced and induction method requires continuous  monitoring of the baby's heart rate  Other risks/issues identified by your obstetrical provider  Please remember that birth is unpredictable. Under certain unforeseeable  circumstances your provider may advise against giving birth in the tub. These decisions will be made on a case-by-case basis and with the safety of you and your baby as our highest priority.   Hyperemesis Gravidarum Hyperemesis gravidarum is a severe form of nausea and vomiting that happens during pregnancy. Hyperemesis is worse than morning sickness. It may cause you to have nausea or vomiting all day for many days. It may keep you from eating and drinking enough food and liquids, which can lead to dehydration, malnutrition, and weight loss. Hyperemesis usually occurs during the first half (the first 20 weeks) of pregnancy. It often goes away once a woman is in her second half of pregnancy. However, sometimes hyperemesis continues through an entire pregnancy. What  are the causes? The cause of this condition is not known. It may be related to changes in chemicals (hormones) in the body during pregnancy, such as the high level of pregnancy hormone (human chorionic gonadotropin) or the increase in the female sex hormone (estrogen). What are the signs or symptoms? Symptoms of this condition include:  Nausea that does not go away.  Vomiting that does not allow you to keep any food down.  Weight loss.  Body fluid loss (dehydration).  Having no desire to eat, or not liking food that you have previously enjoyed. How is this diagnosed? This condition may be diagnosed based on:  A physical exam.  Your medical history.  Your symptoms.  Blood tests.  Urine tests. How is this treated? This condition is managed by controlling symptoms. This may include:  Following an eating plan. This can help lessen nausea and vomiting.  Taking prescription medicines. An eating plan and medicines are often used together to help control symptoms. If medicines do not help relieve nausea and vomiting, you may need to receive fluids through an IV at the hospital. Follow these instructions at home: Eating and  drinking   Avoid the following: ? Drinking fluids with meals. Try not to drink anything during the 30 minutes before and after your meals. ? Drinking more than 1 cup of fluid at a time. ? Eating foods that trigger your symptoms. These may include spicy foods, coffee, high-fat foods, very sweet foods, and acidic foods. ? Skipping meals. Nausea can be more intense on an empty stomach. If you cannot tolerate food, do not force it. Try sucking on ice chips or other frozen items and make up for missed calories later. ? Lying down within 2 hours after eating. ? Being exposed to environmental triggers. These may include food smells, smoky rooms, closed spaces, rooms with strong smells, warm or humid places, overly loud and noisy rooms, and rooms with motion or flickering lights. Try eating meals in a well-ventilated area that is free of strong smells. ? Quick and sudden changes in your movement. ? Taking iron pills and multivitamins that contain iron. If you take prescription iron pills, do not stop taking them unless your health care provider approves. ? Preparing food. The smell of food can spoil your appetite or trigger nausea.  To help relieve your symptoms: ? Listen to your body. Everyone is different and has different preferences. Find what works best for you. ? Eat and drink slowly. ? Eat 5-6 small meals daily instead of 3 large meals. Eating small meals and snacks can help you avoid an empty stomach. ? In the morning, before getting out of bed, eat a couple of crackers to avoid moving around on an empty stomach. ? Try eating starchy foods as these are usually tolerated well. Examples include cereal, toast, bread, potatoes, pasta, rice, and pretzels. ? Include at least 1 serving of protein with your meals and snacks. Protein options include lean meats, poultry, seafood, beans, nuts, nut butters, eggs, cheese, and yogurt. ? Try eating a protein-rich snack before bed. Examples of a protein-rick  snack include cheese and crackers or a peanut butter sandwich made with 1 slice of whole-wheat bread and 1 tsp (5 g) of peanut butter. ? Eat or suck on things that have ginger in them. It may help relieve nausea. Add  tsp ground ginger to hot tea or choose ginger tea. ? Try drinking 100% fruit juice or an electrolyte drink. An electrolyte drink contains sodium,  potassium, and chloride. ? Drink fluids that are cold, clear, and carbonated or sour. Examples include lemonade, ginger ale, lemon-lime soda, ice water, and sparkling water. ? Brush your teeth or use a mouth rinse after meals. ? Talk with your health care provider about starting a supplement of vitamin B6. General instructions  Take over-the-counter and prescription medicines only as told by your health care provider.  Follow instructions from your health care provider about eating or drinking restrictions.  Continue to take your prenatal vitamins as told by your health care provider. If you are having trouble taking your prenatal vitamins, talk with your health care provider about different options.  Keep all follow-up and pre-birth (prenatal) visits as told by your health care provider. This is important. Contact a health care provider if:  You have pain in your abdomen.  You have a severe headache.  You have vision problems.  You are losing weight.  You feel weak or dizzy. Get help right away if:  You cannot drink fluids without vomiting.  You vomit blood.  You have constant nausea and vomiting.  You are very weak.  You faint.  You have a fever and your symptoms suddenly get worse. Summary  Hyperemesis gravidarum is a severe form of nausea and vomiting that happens during pregnancy.  Making some changes to your eating habits may help relieve nausea and vomiting.  This condition may be managed with medicine.  If medicines do not help relieve nausea and vomiting, you may need to receive fluids through an IV at  the hospital. This information is not intended to replace advice given to you by your health care provider. Make sure you discuss any questions you have with your health care provider. Document Released: 07/28/2005 Document Revised: 08/17/2017 Document Reviewed: 03/26/2016 Elsevier Interactive Patient Education  2019 [employer].

## 2018-09-08 NOTE — Progress Notes (Unsigned)
Not fasting. Reschedule 2 hr GTT.  [employer], CPT Phlebotomist

## 2018-09-09 LAB — RPR: RPR Ser Ql: NONREACTIVE

## 2018-09-09 LAB — CBC
HEMATOCRIT: 33.4 % — AB (ref 34.0–46.6)
Hemoglobin: 11.1 g/dL (ref 11.1–15.9)
MCH: 27 pg (ref 26.6–33.0)
MCHC: 33.2 g/dL (ref 31.5–35.7)
MCV: 81 fL (ref 79–97)
Platelets: 216 10*3/uL (ref 150–450)
RBC: 4.11 x10E6/uL (ref 3.77–5.28)
RDW: 13.8 % (ref 11.7–15.4)
WBC: 9.7 10*3/uL (ref 3.4–10.8)

## 2018-09-09 LAB — HIV ANTIBODY (ROUTINE TESTING W REFLEX): HIV Screen 4th Generation wRfx: NONREACTIVE

## 2018-09-10 ENCOUNTER — Other Ambulatory Visit: Payer: Medicaid Other

## 2018-09-10 DIAGNOSIS — Z3480 Encounter for supervision of other normal pregnancy, unspecified trimester: Secondary | ICD-10-CM

## 2018-09-10 DIAGNOSIS — O99210 Obesity complicating pregnancy, unspecified trimester: Secondary | ICD-10-CM

## 2018-09-13 ENCOUNTER — Other Ambulatory Visit: Payer: Self-pay | Admitting: *Deleted

## 2018-09-13 DIAGNOSIS — Z3480 Encounter for supervision of other normal pregnancy, unspecified trimester: Secondary | ICD-10-CM

## 2018-09-15 ENCOUNTER — Other Ambulatory Visit: Payer: Medicaid Other

## 2018-09-15 DIAGNOSIS — Z3480 Encounter for supervision of other normal pregnancy, unspecified trimester: Secondary | ICD-10-CM

## 2018-09-15 DIAGNOSIS — O219 Vomiting of pregnancy, unspecified: Secondary | ICD-10-CM

## 2018-09-15 NOTE — Progress Notes (Signed)
Orders entered

## 2018-09-16 LAB — GLUCOSE TOLERANCE, 2 HOURS W/ 1HR
GLUCOSE, 1 HOUR: 120 mg/dL (ref 65–179)
GLUCOSE, FASTING: 79 mg/dL (ref 65–91)

## 2018-09-16 LAB — GLUCOSE TOLERANCE, 1 HOUR: Glucose, 1Hr PP: 129 mg/dL (ref 65–199)

## 2018-09-22 ENCOUNTER — Encounter: Payer: Self-pay | Admitting: Medical

## 2018-09-23 ENCOUNTER — Encounter: Payer: Self-pay | Admitting: Obstetrics and Gynecology

## 2018-10-05 ENCOUNTER — Ambulatory Visit (INDEPENDENT_AMBULATORY_CARE_PROVIDER_SITE_OTHER): Payer: Medicaid Other | Admitting: Obstetrics and Gynecology

## 2018-10-05 DIAGNOSIS — Z3A31 31 weeks gestation of pregnancy: Secondary | ICD-10-CM

## 2018-10-05 DIAGNOSIS — Z3483 Encounter for supervision of other normal pregnancy, third trimester: Secondary | ICD-10-CM

## 2018-10-05 DIAGNOSIS — Z3480 Encounter for supervision of other normal pregnancy, unspecified trimester: Secondary | ICD-10-CM

## 2018-10-05 NOTE — Progress Notes (Signed)
   PRENATAL VISIT NOTE  Subjective:  Karen Booth is a 23 y.o. G2P1001 at [redacted]w[redacted]d being seen today for ongoing prenatal care.  She is currently monitored for the following issues for this low-risk pregnancy and has Supervision of other normal pregnancy, antepartum; Obesity in pregnancy; and Traumatic birth on their problem list.  Patient reports no complaints.  Contractions: Not present. Vag. Bleeding: None.  Movement: Present. Denies leaking of fluid.   The following portions of the patient's history were reviewed and updated as appropriate: allergies, current medications, past family history, past medical history, past social history, past surgical history and problem list. Problem list updated.  Objective:   Vitals:   10/05/18 1029  BP: 124/84  Pulse: 92  Weight: 178 lb (80.7 kg)    Fetal Status: Fetal Heart Rate (bpm): 145 Fundal Height: 31 cm Movement: Present     General:  Alert, oriented and cooperative. Patient is in no acute distress.  Skin: Skin is warm and dry. No rash noted.   Cardiovascular: Normal heart rate noted  Respiratory: Normal respiratory effort, no problems with respiration noted  Abdomen: Soft, gravid, appropriate for gestational age.  Pain/Pressure: Absent     Pelvic: Cervical exam deferred        Extremities: Normal range of motion.  Edema: None  Mental Status: Normal mood and affect. Normal behavior. Normal judgment and thought content.   Assessment and Plan:  Pregnancy: G2P1001 at [redacted]w[redacted]d  1. Traumatic birth  Will have patient scheduled with MD to discuss possible C-section. Baby was resuscitated with last birth and she has a lot of concerns.   2. Supervision of other normal pregnancy, antepartum  Doing well  Birth control options discussed    There are no diagnoses linked to this encounter. Preterm labor symptoms and general obstetric precautions including but not limited to vaginal bleeding, contractions, leaking of fluid and fetal movement were  reviewed in detail with the patient. Please refer to After Visit Summary for other counseling recommendations.  Return in about 2 weeks (around 10/19/2018) for Please schedule with MD to discuss C-section due to tramatic delivery. .  No future appointments.  Venia Carbon, NP

## 2018-10-05 NOTE — Patient Instructions (Signed)

## 2018-10-12 ENCOUNTER — Other Ambulatory Visit: Payer: Self-pay | Admitting: Certified Nurse Midwife

## 2018-10-12 DIAGNOSIS — K117 Disturbances of salivary secretion: Secondary | ICD-10-CM

## 2018-10-19 ENCOUNTER — Ambulatory Visit (INDEPENDENT_AMBULATORY_CARE_PROVIDER_SITE_OTHER): Payer: Medicaid Other | Admitting: Obstetrics & Gynecology

## 2018-10-19 VITALS — BP 149/84 | HR 87 | Wt 180.6 lb

## 2018-10-19 DIAGNOSIS — O0993 Supervision of high risk pregnancy, unspecified, third trimester: Secondary | ICD-10-CM

## 2018-10-19 DIAGNOSIS — Z3A33 33 weeks gestation of pregnancy: Secondary | ICD-10-CM

## 2018-10-19 DIAGNOSIS — O10913 Unspecified pre-existing hypertension complicating pregnancy, third trimester: Secondary | ICD-10-CM

## 2018-10-19 LAB — POCT URINALYSIS DIP (DEVICE)
Bilirubin Urine: NEGATIVE
Glucose, UA: NEGATIVE mg/dL
Ketones, ur: NEGATIVE mg/dL
Nitrite: NEGATIVE
PH: 6 (ref 5.0–8.0)
Protein, ur: 30 mg/dL — AB
Specific Gravity, Urine: 1.025 (ref 1.005–1.030)
Urobilinogen, UA: 0.2 mg/dL (ref 0.0–1.0)

## 2018-10-19 NOTE — Progress Notes (Signed)
PRENATAL VISIT NOTE  Subjective:  Karen Booth is a 23 y.o. G2P1001 at [redacted]w[redacted]d being seen today for ongoing prenatal care.  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  Patient reports no complaints. Patient denies any headaches, visual symptoms, RUQ/epigastric pain or other concerning symptoms.  Contractions: Irritability. Vag. Bleeding: None.  Movement: Present. Denies leaking of fluid.   The following portions of the patient's history were reviewed and updated as appropriate: allergies, current medications, past family history, past medical history, past social history, past surgical history and problem list.   Objective:   Vitals:   10/19/18 0952 10/19/18 0959  BP: 135/89 (!) 149/84  Pulse: 87   Weight: 180 lb 9.6 oz (81.9 kg)     Fetal Status: Fetal Heart Rate (bpm): 140 Fundal Height: 35 cm Movement: Present     General:  Alert, oriented and cooperative. Patient is in no acute distress.  Skin: Skin is warm and dry. No rash noted.   Cardiovascular: Normal heart rate noted  Respiratory: Normal respiratory effort, no problems with respiration noted  Abdomen: Soft, gravid, appropriate for gestational age.  Pain/Pressure: Present     Pelvic: Cervical exam deferred        Extremities: Normal range of motion.  Edema: None  Mental Status: Normal mood and affect. Normal behavior. Normal judgment and thought content.   Assessment and Plan:  Pregnancy: G2P1001 at [redacted]w[redacted]d 1. Chronic hypertension in third trimester On chart review, she had BP 143/88 at [redacted]w[redacted]d MAU visit>130s/80s occasionally >151/88 at [redacted]w[redacted]d>>130s/80s>>143/88 at 23w3 MAU visit>>149/84 at [redacted]w[redacted]d. Patient also said she had elevated BP (150s/90s) prior to pregnancy for years prior to this pregnancy, after she gained weight after delivery of son five  years ago. Will check labs today, start antenatal testing this week. Unable to take ASA due to Ibuprofen anaphylaxis reaction.  IOL 39-40 weeks or earlier for worsening BP/superimposed PEC/severe features. Growth scan ordered. - CBC - Comprehensive metabolic panel - Protein / creatinine ratio, urine - [place] MFM OB FOLLOW UP; Future - [place] MFM FETAL BPP WO NON STRESS; Future  2. Traumatic birth Patient's last delivery in 2018 was complicated by antenatal variable decels, meconium and postnatal low Apgars, NICU resuscitation of infant with prolonged NICU stay.  She desires cesarean delivery for this pregnancy. Discussed risks of cesarean section in detail, also pointed out the respiratory distress can also occur with cesarean delivery, this is not a way to avoid it.  After discussion of various risks, she is considering trial of vaginal delivery, but wants to proceed with cesarean section if there is anything wrong as she feels that she should have had a cesarean delivery given what was going on with her baby last time (of note, it was offered to her, but she declined at that time because "my Mama did not let me do it"[she was 17 at the time]).  She wants the right to say she wants to proceed with cesarean section if the tracing gets concerning or if she gets really worried.  She was told that this desire will be documented here and in her problem list. No cesarean section scheduled for now. Of note, I did contact my partners via group text, they were willing to offer her a primary cesarean section given her history.   3. Supervision of high risk pregnancy in third trimester  Preterm labor symptoms and general obstetric precautions including but not limited to vaginal bleeding, contractions, leaking of fluid and fetal movement were reviewed in detail with the patient. Please refer to After Visit Summary for other counseling recommendations.   Return for NST, BPP at 2:15 pm tomorrow. Then one week: NST, BPP,  HOB.  No future appointments.  Jaynie Collins, MD

## 2018-10-19 NOTE — Patient Instructions (Signed)
Return to office for any scheduled appointments. Call the office or go to the MAU at [facility] [place] if:  You begin to have strong, frequent contractions  Your water breaks.  Sometimes it is a big gush of fluid, sometimes it is just a trickle that keeps getting your panties wet or running down your legs  You have vaginal bleeding.  It is normal to have a small amount of spotting if your cervix was checked.   You do not feel your baby moving like normal.  If you do not, get something to eat and drink and lay down and focus on feeling your baby move.   If your baby is still not moving like normal, you should call the office or go to MAU.  Any other obstetric concerns.   

## 2018-10-20 ENCOUNTER — Other Ambulatory Visit: Payer: Self-pay

## 2018-10-20 ENCOUNTER — Ambulatory Visit (INDEPENDENT_AMBULATORY_CARE_PROVIDER_SITE_OTHER): Payer: Medicaid Other | Admitting: *Deleted

## 2018-10-20 ENCOUNTER — Ambulatory Visit: Payer: Self-pay

## 2018-10-20 VITALS — BP 129/85 | HR 79 | Wt 180.9 lb

## 2018-10-20 DIAGNOSIS — O10913 Unspecified pre-existing hypertension complicating pregnancy, third trimester: Secondary | ICD-10-CM | POA: Diagnosis present

## 2018-10-20 LAB — CBC
Hematocrit: 31.1 % — ABNORMAL LOW (ref 34.0–46.6)
Hemoglobin: 10.6 g/dL — ABNORMAL LOW (ref 11.1–15.9)
MCH: 26.6 pg (ref 26.6–33.0)
MCHC: 34.1 g/dL (ref 31.5–35.7)
MCV: 78 fL — ABNORMAL LOW (ref 79–97)
Platelets: 211 10*3/uL (ref 150–450)
RBC: 3.99 x10E6/uL (ref 3.77–5.28)
RDW: 14.2 % (ref 11.7–15.4)
WBC: 8.3 10*3/uL (ref 3.4–10.8)

## 2018-10-20 LAB — COMPREHENSIVE METABOLIC PANEL
ALT: 10 IU/L (ref 0–32)
AST: 12 IU/L (ref 0–40)
Albumin/Globulin Ratio: 1.5 (ref 1.2–2.2)
Albumin: 3.5 g/dL — ABNORMAL LOW (ref 3.9–5.0)
Alkaline Phosphatase: 83 IU/L (ref 39–117)
BUN/Creatinine Ratio: 14 (ref 9–23)
BUN: 9 mg/dL (ref 6–20)
Bilirubin Total: <0.2 mg/dL (ref 0.0–1.2)
CO2: 20 mmol/L (ref 20–29)
Calcium: 8.9 mg/dL (ref 8.7–10.2)
Chloride: 107 mmol/L — ABNORMAL HIGH (ref 96–106)
Creatinine, Ser: 0.66 mg/dL (ref 0.57–1.00)
GFR calc Af Amer: 145 mL/min/{1.73_m2} (ref >59)
GFR, EST NON AFRICAN AMERICAN: 126 mL/min/{1.73_m2} (ref >59)
Globulin, Total: 2.3 g/dL (ref 1.5–4.5)
Glucose: 80 mg/dL (ref 65–99)
Potassium: 4.1 mmol/L (ref 3.5–5.2)
Sodium: 141 mmol/L (ref 134–144)
Total Protein: 5.8 g/dL — ABNORMAL LOW (ref 6.0–8.5)

## 2018-10-20 LAB — PROTEIN / CREATININE RATIO, URINE
Creatinine, Urine: 207.9 mg/dL
Protein, Ur: 32 mg/dL
Protein/Creat Ratio: 154 mg/g creat (ref 0–200)

## 2018-10-20 NOTE — Progress Notes (Signed)

## 2018-10-26 ENCOUNTER — Telehealth: Payer: Self-pay | Admitting: Obstetrics & Gynecology

## 2018-10-26 NOTE — Telephone Encounter (Signed)
Called person to inform her of only one person at visit.

## 2018-10-27 ENCOUNTER — Other Ambulatory Visit: Payer: Self-pay

## 2018-10-27 ENCOUNTER — Ambulatory Visit (HOSPITAL_COMMUNITY)
Admission: RE | Admit: 2018-10-27 | Discharge: 2018-10-27 | Disposition: A | Discharge status: Home / Self Care | Payer: Medicaid Other | Source: Ambulatory Visit | Attending: Obstetrics & Gynecology | Admitting: Obstetrics & Gynecology

## 2018-10-27 ENCOUNTER — Encounter (HOSPITAL_COMMUNITY): Payer: Self-pay | Admitting: *Deleted

## 2018-10-27 ENCOUNTER — Ambulatory Visit (INDEPENDENT_AMBULATORY_CARE_PROVIDER_SITE_OTHER): Payer: Medicaid Other | Admitting: General Practice

## 2018-10-27 ENCOUNTER — Ambulatory Visit (HOSPITAL_COMMUNITY): Payer: Medicaid Other | Admitting: *Deleted

## 2018-10-27 ENCOUNTER — Ambulatory Visit (INDEPENDENT_AMBULATORY_CARE_PROVIDER_SITE_OTHER): Payer: Medicaid Other | Admitting: Obstetrics & Gynecology

## 2018-10-27 ENCOUNTER — Other Ambulatory Visit (HOSPITAL_COMMUNITY): Payer: Self-pay | Admitting: *Deleted

## 2018-10-27 VITALS — BP 135/82 | HR 77 | Temp 99.0°F | Wt 183.0 lb

## 2018-10-27 DIAGNOSIS — O10919 Unspecified pre-existing hypertension complicating pregnancy, unspecified trimester: Secondary | ICD-10-CM

## 2018-10-27 DIAGNOSIS — O99210 Obesity complicating pregnancy, unspecified trimester: Secondary | ICD-10-CM | POA: Insufficient documentation

## 2018-10-27 DIAGNOSIS — O99213 Obesity complicating pregnancy, third trimester: Secondary | ICD-10-CM | POA: Diagnosis not present

## 2018-10-27 DIAGNOSIS — O0993 Supervision of high risk pregnancy, unspecified, third trimester: Secondary | ICD-10-CM | POA: Insufficient documentation

## 2018-10-27 DIAGNOSIS — Z3A34 34 weeks gestation of pregnancy: Secondary | ICD-10-CM

## 2018-10-27 DIAGNOSIS — O10019 Pre-existing essential hypertension complicating pregnancy, unspecified trimester: Secondary | ICD-10-CM

## 2018-10-27 DIAGNOSIS — O10913 Unspecified pre-existing hypertension complicating pregnancy, third trimester: Secondary | ICD-10-CM

## 2018-10-27 DIAGNOSIS — Z362 Encounter for other antenatal screening follow-up: Secondary | ICD-10-CM | POA: Diagnosis not present

## 2018-10-27 NOTE — Telephone Encounter (Signed)
Refill on glycopyrrolate 1 mg

## 2018-10-27 NOTE — Progress Notes (Signed)
34 85  

## 2018-10-27 NOTE — Progress Notes (Signed)
PRENATAL VISIT NOTE  Subjective:  Karen Booth is a 23 y.o. G2P1001 at [redacted]w[redacted]d being seen today for ongoing prenatal care.  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  Patient reports no complaints.  Contractions: Irritability. Vag. Bleeding: None.  Movement: Present. Denies leaking of fluid.   The following portions of the patient's history were reviewed and updated as appropriate: allergies, current medications, past family history, past medical history, past social history, past surgical history and problem list.   Objective:   Vitals:   10/27/18 1350 10/27/18 1356  BP: (!) 148/85 135/82  Pulse: 77   Temp: 99 F (37.2 C)   Weight: 183 lb (83 kg)     Fetal Status: Fetal Heart Rate (bpm): NST   Movement: Present     General:  Alert, oriented and cooperative. Patient is in no acute distress.  Skin: Skin is warm and dry. No rash noted.   Cardiovascular: Normal heart rate noted  Respiratory: Normal respiratory effort, no problems with respiration noted  Abdomen: Soft, gravid, appropriate for gestational age.  Pain/Pressure: Present     Pelvic: Cervical exam deferred        Extremities: Normal range of motion.  Edema: None  Mental Status: Normal mood and affect. Normal behavior. Normal judgment and thought content.   Imaging: US Fetal Bpp W/nonstress  Result Date: 10/25/2018 ----------------------------------------------------------------------  OBSTETRICS REPORT                       (Signed Final 10/25/2018 07:44 am) ---------------------------------------------------------------------- Patient Info  ID #:       [id number]                          D.O.B.:  April 24, 1996 (22 yrs)  Name:       [facility]                      Visit Date: 10/20/2018 03:04 pm  ---------------------------------------------------------------------- Performed By  Performed By:     Sedalia Muta [facility]          Ref. Address:     [facility]                                                             [address]                                                             [address]  [place], [place]                                                             [address]  Attending:        Jaynie Collins         Location:         Center for                    MD                                       [facility]  Referred By:      [facility] for                    [facility]                    Healthcare ---------------------------------------------------------------------- Orders   #  Description                          Code         Ordered By   1  US FETAL BPP W/NONSTRESS             93818.2      Jaynie Collins  ----------------------------------------------------------------------   #  Order #                    Accession #                 Episode #   1  [id number]                  [id number]                  [id number]  ---------------------------------------------------------------------- Service(s) Provided   US Fetal BPP W NST                                   [phone]  ---------------------------------------------------------------------- Indications   [redacted] weeks gestation of pregnancy                Z3A.33   Unspecified pre-existing hypertension          O10.913   complicating pregnancy, third trimester  ---------------------------------------------------------------------- Fetal Evaluation  Num Of Fetuses:         1  Preg. Location:         Intrauterine  Cardiac Activity:       Observed  Presentation:           Cephalic  Amniotic Fluid  AFI FV:  Within normal limits   AFI Sum(cm)     %Tile       Largest Pocket(cm)  16.12           58          8.04  RUQ(cm)       RLQ(cm)       LUQ(cm)        LLQ(cm)  8.04          3.58          3              1.5 ---------------------------------------------------------------------- Biophysical Evaluation  Amniotic F.V:   Pocket => 2 cm two         F. Tone:        Observed                  planes  F. Movement:    Observed                   N.S.T:          Reactive  F. Breathing:   Not Observed               Score:          8/10 ---------------------------------------------------------------------- OB History  Gravidity:    2         Term:   1  Living:       1 ---------------------------------------------------------------------- Gestational Age  LMP:           33w 2d        Date:  03/01/18                 EDD:   12/06/18  Best:          33w 2d     Det. By:  LMP  (03/01/18)          EDD:   12/06/18 ---------------------------------------------------------------------- Impression  Antenatal testing is reassuring with BPP 8/10. Normal  amniotic fluid volume. ---------------------------------------------------------------------- Recommendations  Continue weekly antenatal testing till delivery. ----------------------------------------------------------------------              Jaynie Collins, MD Electronically Signed Final Report   10/25/2018 07:44 am ----------------------------------------------------------------------   Assessment and Plan:  Pregnancy: G2P1001 at [redacted]w[redacted]d 1. Chronic hypertension in third trimester BP stable. NST performed today was reviewed and was found to be reactive.  Continue recommended antenatal testing and prenatal care.  2. Supervision of high risk pregnancy in third trimester Preterm labor symptoms and general obstetric precautions including but not limited to vaginal bleeding, contractions, leaking of fluid and fetal movement were reviewed in detail with the patient. Please refer to After Visit Summary for other  counseling recommendations.   Return for NST, BPP, OB Visit (HOB).  Future Appointments  Date Time Provider Department Center  10/27/2018  3:00 PM WH-MFC NURSE WH-MFC MFC-US  10/27/2018  3:15 PM WH-MFC [place] 4 WH-MFCUS MFC-US  11/03/2018  2:15 PM WOC-WOCA NST WOC-WOCA WOC  11/03/2018  3:15 PM Reva Bores, MD WOC-WOCA WOC    Jaynie Collins, MD

## 2018-10-27 NOTE — Progress Notes (Signed)
I have reviewed the chart and agree with nursing staff's documentation of this patient's encounter.  Zamaya Rapaport, MD 10/27/2018 4:26 PM    

## 2018-10-27 NOTE — Patient Instructions (Signed)
Return to office for any scheduled appointments. Call the office or go to the MAU at [facility] [place] if:  You begin to have strong, frequent contractions  Your water breaks.  Sometimes it is a big gush of fluid, sometimes it is just a trickle that keeps getting your panties wet or running down your legs  You have vaginal bleeding.  It is normal to have a small amount of spotting if your cervix was checked.   You do not feel your baby moving like normal.  If you do not, get something to eat and drink and lay down and focus on feeling your baby move.   If your baby is still not moving like normal, you should call the office or go to MAU.  Any other obstetric concerns.   

## 2018-10-27 NOTE — Progress Notes (Signed)
Patient presents to office today for NST & Ob F/u visit. NST Reactive. Patient will see Dr Macon Large for Ob visit.  Chase Caller RN BSN 10/27/18

## 2018-10-28 ENCOUNTER — Other Ambulatory Visit: Payer: Self-pay

## 2018-10-28 NOTE — Telephone Encounter (Signed)
Patient is taking protonix

## 2018-11-03 ENCOUNTER — Other Ambulatory Visit: Payer: Self-pay

## 2018-11-03 ENCOUNTER — Ambulatory Visit (INDEPENDENT_AMBULATORY_CARE_PROVIDER_SITE_OTHER): Payer: Medicaid Other | Admitting: *Deleted

## 2018-11-03 ENCOUNTER — Ambulatory Visit: Payer: Self-pay

## 2018-11-03 ENCOUNTER — Ambulatory Visit (INDEPENDENT_AMBULATORY_CARE_PROVIDER_SITE_OTHER): Payer: Medicaid Other | Admitting: Family Medicine

## 2018-11-03 VITALS — BP 153/105 | HR 93 | Temp 98.8°F | Wt 187.5 lb

## 2018-11-03 DIAGNOSIS — O10913 Unspecified pre-existing hypertension complicating pregnancy, third trimester: Secondary | ICD-10-CM

## 2018-11-03 DIAGNOSIS — Z3A35 35 weeks gestation of pregnancy: Secondary | ICD-10-CM

## 2018-11-03 DIAGNOSIS — O0993 Supervision of high risk pregnancy, unspecified, third trimester: Secondary | ICD-10-CM

## 2018-11-03 NOTE — Patient Instructions (Signed)

## 2018-11-03 NOTE — Progress Notes (Addendum)
Pt had [place] growth and BPP on 3/18 @ MFM. Pt reports continued swelling of feet, ankles and hands. She denies H/A or visual disturbances. Pt denies sx of UTI.   Pt informed that the ultrasound is considered a limited OB ultrasound and is not intended to be a complete ultrasound exam.  Patient also informed that the ultrasound is not being completed with the intent of assessing for fetal or placental anomalies or any pelvic abnormalities.  Explained that the purpose of today's ultrasound is to assess for presentation, BPP and amniotic fluid visit.  Patient acknowledges the purpose of the exam and the limitations of the study.

## 2018-11-04 LAB — COMPREHENSIVE METABOLIC PANEL
ALBUMIN: 3.2 g/dL — AB (ref 3.9–5.0)
ALK PHOS: 90 IU/L (ref 39–117)
ALT: 14 IU/L (ref 0–32)
AST: 16 IU/L (ref 0–40)
Albumin/Globulin Ratio: 1.5 (ref 1.2–2.2)
BUN/Creatinine Ratio: 15 (ref 9–23)
BUN: 10 mg/dL (ref 6–20)
Bilirubin Total: 0.3 mg/dL (ref 0.0–1.2)
CO2: 20 mmol/L (ref 20–29)
CREATININE: 0.66 mg/dL (ref 0.57–1.00)
Calcium: 8.2 mg/dL — ABNORMAL LOW (ref 8.7–10.2)
Chloride: 107 mmol/L — ABNORMAL HIGH (ref 96–106)
GFR calc Af Amer: 145 mL/min/{1.73_m2} (ref >59)
GFR calc non Af Amer: 126 mL/min/{1.73_m2} (ref >59)
Globulin, Total: 2.1 g/dL (ref 1.5–4.5)
Glucose: 81 mg/dL (ref 65–99)
Potassium: 3.9 mmol/L (ref 3.5–5.2)
Sodium: 140 mmol/L (ref 134–144)
Total Protein: 5.3 g/dL — ABNORMAL LOW (ref 6.0–8.5)

## 2018-11-04 LAB — POCT URINALYSIS DIP (DEVICE)
Glucose, UA: NEGATIVE mg/dL
Ketones, ur: NEGATIVE mg/dL
Nitrite: NEGATIVE
Protein, ur: >=300 mg/dL — AB
Specific Gravity, Urine: >=1.03 (ref 1.005–1.030)
UROBILINOGEN UA: 0.2 mg/dL (ref 0.0–1.0)
pH: 6 (ref 5.0–8.0)

## 2018-11-04 LAB — CBC
Hematocrit: 29.6 % — ABNORMAL LOW (ref 34.0–46.6)
Hemoglobin: 10.3 g/dL — ABNORMAL LOW (ref 11.1–15.9)
MCH: 26.3 pg — ABNORMAL LOW (ref 26.6–33.0)
MCHC: 34.8 g/dL (ref 31.5–35.7)
MCV: 76 fL — ABNORMAL LOW (ref 79–97)
Platelets: 161 10*3/uL (ref 150–450)
RBC: 3.91 x10E6/uL (ref 3.77–5.28)
RDW: 14.5 % (ref 11.7–15.4)
WBC: 8.3 10*3/uL (ref 3.4–10.8)

## 2018-11-04 LAB — PROTEIN / CREATININE RATIO, URINE
Creatinine, Urine: 326.7 mg/dL
Protein, Ur: 170.5 mg/dL
Protein/Creat Ratio: 522 mg/g creat — ABNORMAL HIGH (ref 0–200)

## 2018-11-04 NOTE — Progress Notes (Signed)
   PRENATAL VISIT NOTE  Subjective:  Karen Booth is a 23 y.o. G2P1001 at [redacted]w[redacted]d being seen today for ongoing prenatal care.  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  Patient reports no complaints.  Contractions: Irregular. Vag. Bleeding: None.  Movement: Present. Denies leaking of fluid.   The following portions of the patient's history were reviewed and updated as appropriate: allergies, current medications, past family history, past medical history, past social history, past surgical history and problem list.   Objective:   Vitals:   11/03/18 1440 11/03/18 1533  BP: (!) 144/93 (!) 153/105  Pulse: 93   Temp: 98.8 F (37.1 C)   Weight: 187 lb 8 oz (85 kg)     Fetal Status: Fetal Heart Rate (bpm): NST   Movement: Present     General:  Alert, oriented and cooperative. Patient is in no acute distress.  Skin: Skin is warm and dry. No rash noted.   Cardiovascular: Normal heart rate noted  Respiratory: Normal respiratory effort, no problems with respiration noted  Abdomen: Soft, gravid, appropriate for gestational age.  Pain/Pressure: Absent     Pelvic: Cervical exam deferred        Extremities: Normal range of motion.  Edema: Mild pitting, slight indentation  Mental Status: Normal mood and affect. Normal behavior. Normal judgment and thought content.  NST:  Baseline: 130 bpm, Variability: Good {> 6 bpm), Accelerations: Reactive and Decelerations: Absent   Assessment and Plan:  Pregnancy: G2P1001 at [redacted]w[redacted]d 1. Supervision of high risk pregnancy in third trimester - Culture, OB Urine  2. Chronic hypertension in third trimester BP is back to baseline--check labs to r/o SIPE--will call w/ results - CBC - Comprehensive metabolic panel - Protein / creatinine ratio, urine  Preterm labor symptoms and  general obstetric precautions including but not limited to vaginal bleeding, contractions, leaking of fluid and fetal movement were reviewed in detail with the patient. Please refer to After Visit Summary for other counseling recommendations.   Return in about 1 week (around 11/10/2018) for [facility] and NST/BPP weekly.  Future Appointments  Date Time Provider Department Center  11/10/2018  3:15 PM WOC-WOCA NST WOC-WOCA WOC  11/10/2018  4:15 PM Willodean Rosenthal, MD WOC-WOCA WOC  11/17/2018  4:15 PM Allie Bossier, MD WOC-WOCA WOC  11/24/2018  2:15 PM Adam Phenix, MD WOC-WOCA WOC  11/24/2018  3:30 PM WH-MFC NURSE WH-MFC MFC-US  11/24/2018  3:30 PM WH-MFC [place] 1 WH-MFCUS MFC-US  12/01/2018  3:15 PM WOC-WOCA NST WOC-WOCA WOC  12/01/2018  4:15 PM [place] Bing, MD WOC-WOCA WOC  12/08/2018  3:15 PM WOC-WOCA NST WOC-WOCA WOC  12/08/2018  4:15 PM [place] Bing, MD [facility] WOC    Reva Bores, MD

## 2018-11-05 ENCOUNTER — Encounter: Payer: Self-pay | Admitting: Family Medicine

## 2018-11-05 DIAGNOSIS — O119 Pre-existing hypertension with pre-eclampsia, unspecified trimester: Secondary | ICD-10-CM

## 2018-11-05 DIAGNOSIS — O10019 Pre-existing essential hypertension complicating pregnancy, unspecified trimester: Secondary | ICD-10-CM | POA: Insufficient documentation

## 2018-11-05 LAB — URINE CULTURE, OB REFLEX

## 2018-11-05 LAB — CULTURE, OB URINE

## 2018-11-09 ENCOUNTER — Encounter (HOSPITAL_COMMUNITY): Payer: Self-pay | Admitting: *Deleted

## 2018-11-09 ENCOUNTER — Telehealth: Payer: Self-pay | Admitting: Family Medicine

## 2018-11-09 ENCOUNTER — Ambulatory Visit: Payer: Medicaid Other | Admitting: Family Medicine

## 2018-11-09 ENCOUNTER — Inpatient Hospital Stay (HOSPITAL_COMMUNITY): Payer: Medicaid Other

## 2018-11-09 ENCOUNTER — Other Ambulatory Visit: Payer: Self-pay

## 2018-11-09 ENCOUNTER — Inpatient Hospital Stay (HOSPITAL_COMMUNITY)
Admission: AD | Admit: 2018-11-09 | Discharge: 2018-11-12 | DRG: 805 | Disposition: A | Discharge status: Home / Self Care | Payer: Medicaid Other | Attending: Obstetrics and Gynecology | Admitting: Obstetrics and Gynecology

## 2018-11-09 DIAGNOSIS — O9942 Diseases of the circulatory system complicating childbirth: Secondary | ICD-10-CM | POA: Diagnosis present

## 2018-11-09 DIAGNOSIS — O114 Pre-existing hypertension with pre-eclampsia, complicating childbirth: Secondary | ICD-10-CM | POA: Diagnosis present

## 2018-11-09 DIAGNOSIS — F419 Anxiety disorder, unspecified: Secondary | ICD-10-CM | POA: Diagnosis present

## 2018-11-09 DIAGNOSIS — F322 Major depressive disorder, single episode, severe without psychotic features: Secondary | ICD-10-CM | POA: Diagnosis not present

## 2018-11-09 DIAGNOSIS — O151 Eclampsia in labor: Secondary | ICD-10-CM | POA: Diagnosis not present

## 2018-11-09 DIAGNOSIS — I6783 Posterior reversible encephalopathy syndrome: Secondary | ICD-10-CM | POA: Diagnosis present

## 2018-11-09 DIAGNOSIS — Z3A36 36 weeks gestation of pregnancy: Secondary | ICD-10-CM

## 2018-11-09 DIAGNOSIS — O1424 HELLP syndrome, complicating childbirth: Secondary | ICD-10-CM | POA: Diagnosis not present

## 2018-11-09 DIAGNOSIS — O119 Pre-existing hypertension with pre-eclampsia, unspecified trimester: Secondary | ICD-10-CM | POA: Diagnosis present

## 2018-11-09 DIAGNOSIS — O10019 Pre-existing essential hypertension complicating pregnancy, unspecified trimester: Secondary | ICD-10-CM | POA: Diagnosis present

## 2018-11-09 DIAGNOSIS — Z87891 Personal history of nicotine dependence: Secondary | ICD-10-CM

## 2018-11-09 DIAGNOSIS — O1503 Eclampsia in pregnancy, third trimester: Secondary | ICD-10-CM | POA: Diagnosis not present

## 2018-11-09 DIAGNOSIS — F329 Major depressive disorder, single episode, unspecified: Secondary | ICD-10-CM | POA: Diagnosis present

## 2018-11-09 DIAGNOSIS — O1002 Pre-existing essential hypertension complicating childbirth: Secondary | ICD-10-CM | POA: Diagnosis present

## 2018-11-09 DIAGNOSIS — O99344 Other mental disorders complicating childbirth: Secondary | ICD-10-CM | POA: Diagnosis present

## 2018-11-09 DIAGNOSIS — R030 Elevated blood-pressure reading, without diagnosis of hypertension: Secondary | ICD-10-CM | POA: Diagnosis present

## 2018-11-09 HISTORY — DX: Unspecified infectious disease: B99.9

## 2018-11-09 HISTORY — DX: Anxiety disorder, unspecified: F41.9

## 2018-11-09 HISTORY — DX: Essential (primary) hypertension: I10

## 2018-11-09 HISTORY — DX: Chlamydial infection, unspecified: A74.9

## 2018-11-09 LAB — COMPREHENSIVE METABOLIC PANEL
ALBUMIN: 2.7 g/dL — AB (ref 3.5–5.0)
ALT: 40 U/L (ref 0–44)
ALT: 47 U/L — ABNORMAL HIGH (ref 0–44)
AST: 56 U/L — ABNORMAL HIGH (ref 15–41)
AST: 64 U/L — ABNORMAL HIGH (ref 15–41)
Albumin: 2.8 g/dL — ABNORMAL LOW (ref 3.5–5.0)
Alkaline Phosphatase: 89 U/L (ref 38–126)
Alkaline Phosphatase: 98 U/L (ref 38–126)
Anion gap: 9 (ref 5–15)
Anion gap: 9 (ref 5–15)
BUN: 10 mg/dL (ref 6–20)
BUN: 9 mg/dL (ref 6–20)
CO2: 21 mmol/L — ABNORMAL LOW (ref 22–32)
CO2: 20 mmol/L — AB (ref 22–32)
Calcium: 8.5 mg/dL — ABNORMAL LOW (ref 8.9–10.3)
Calcium: 8.3 mg/dL — ABNORMAL LOW (ref 8.9–10.3)
Chloride: 109 mmol/L (ref 98–111)
Chloride: 110 mmol/L (ref 98–111)
Creatinine, Ser: 0.89 mg/dL (ref 0.44–1.00)
Creatinine, Ser: 0.8 mg/dL (ref 0.44–1.00)
GFR calc Af Amer: >60 mL/min (ref >60)
GFR calc Af Amer: >60 mL/min (ref >60)
GFR calc non Af Amer: >60 mL/min (ref >60)
GFR calc non Af Amer: >60 mL/min (ref >60)
Glucose, Bld: 89 mg/dL (ref 70–99)
Glucose, Bld: 88 mg/dL (ref 70–99)
POTASSIUM: 3.3 mmol/L — AB (ref 3.5–5.1)
POTASSIUM: 3.6 mmol/L (ref 3.5–5.1)
Sodium: 139 mmol/L (ref 135–145)
Sodium: 139 mmol/L (ref 135–145)
Total Bilirubin: 0.7 mg/dL (ref 0.3–1.2)
Total Bilirubin: 1.1 mg/dL (ref 0.3–1.2)
Total Protein: 5.9 g/dL — ABNORMAL LOW (ref 6.5–8.1)
Total Protein: 6.2 g/dL — ABNORMAL LOW (ref 6.5–8.1)

## 2018-11-09 LAB — CBC
HCT: 34.8 % — ABNORMAL LOW (ref 36.0–46.0)
HCT: 35.2 % — ABNORMAL LOW (ref 36.0–46.0)
HCT: 33 % — ABNORMAL LOW (ref 36.0–46.0)
Hemoglobin: 11.2 g/dL — ABNORMAL LOW (ref 12.0–15.0)
Hemoglobin: 11.7 g/dL — ABNORMAL LOW (ref 12.0–15.0)
Hemoglobin: 11.1 g/dL — ABNORMAL LOW (ref 12.0–15.0)
MCH: 25.6 pg — ABNORMAL LOW (ref 26.0–34.0)
MCH: 26.2 pg (ref 26.0–34.0)
MCH: 26.6 pg (ref 26.0–34.0)
MCHC: 32.2 g/dL (ref 30.0–36.0)
MCHC: 33.2 g/dL (ref 30.0–36.0)
MCHC: 33.6 g/dL (ref 30.0–36.0)
MCV: 79.5 fL — ABNORMAL LOW (ref 80.0–100.0)
MCV: 78.9 fL — ABNORMAL LOW (ref 80.0–100.0)
MCV: 78.9 fL — ABNORMAL LOW (ref 80.0–100.0)
NRBC: 0.4 % — AB (ref 0.0–0.2)
Platelets: 155 10*3/uL (ref 150–400)
Platelets: 155 10*3/uL (ref 150–400)
Platelets: 156 10*3/uL (ref 150–400)
RBC: 4.38 MIL/uL (ref 3.87–5.11)
RBC: 4.46 MIL/uL (ref 3.87–5.11)
RBC: 4.18 MIL/uL (ref 3.87–5.11)
RDW: 15.7 % — ABNORMAL HIGH (ref 11.5–15.5)
RDW: 15.7 % — AB (ref 11.5–15.5)
RDW: 16 % — ABNORMAL HIGH (ref 11.5–15.5)
WBC: 10.7 10*3/uL — ABNORMAL HIGH (ref 4.0–10.5)
WBC: 17 10*3/uL — ABNORMAL HIGH (ref 4.0–10.5)
WBC: 15.6 10*3/uL — ABNORMAL HIGH (ref 4.0–10.5)
nRBC: 0.3 % — ABNORMAL HIGH (ref 0.0–0.2)
nRBC: 0.1 % (ref 0.0–0.2)

## 2018-11-09 LAB — PROTIME-INR
INR: 0.9 (ref 0.8–1.2)
Prothrombin Time: 12.5 seconds (ref 11.4–15.2)

## 2018-11-09 LAB — PROTEIN / CREATININE RATIO, URINE
Creatinine, Urine: 96.13 mg/dL
Protein Creatinine Ratio: 1.36 mg/mg{Cre} — ABNORMAL HIGH (ref 0.00–0.15)
Total Protein, Urine: 131 mg/dL

## 2018-11-09 LAB — MAGNESIUM: Magnesium: 4.1 mg/dL — ABNORMAL HIGH (ref 1.7–2.4)

## 2018-11-09 LAB — APTT: APTT: 29 s (ref 24–36)

## 2018-11-09 LAB — FIBRINOGEN: Fibrinogen: 633 mg/dL — ABNORMAL HIGH (ref 210–475)

## 2018-11-09 LAB — ABO/RH: ABO/RH(D): O POS

## 2018-11-09 MED ORDER — HYDRALAZINE HCL 20 MG/ML IJ SOLN
10.0000 mg | INTRAMUSCULAR | Status: DC | PRN
  Administered 2018-11-09 – 2018-11-12 (×2): 10 mg via INTRAVENOUS
  Filled 2018-11-09 (×2): qty 1

## 2018-11-09 MED ORDER — ONDANSETRON HCL 4 MG/2ML IJ SOLN
4.0000 mg | Freq: Once | INTRAMUSCULAR | Status: AC
  Administered 2018-11-09: 4 mg via INTRAVENOUS
  Filled 2018-11-09: qty 2

## 2018-11-09 MED ORDER — OXYCODONE-ACETAMINOPHEN 5-325 MG PO TABS: 2.0000 | ORAL_TABLET | ORAL | Status: DC | PRN

## 2018-11-09 MED ORDER — MAGNESIUM SULFATE BOLUS VIA INFUSION
4.0000 g | Freq: Once | INTRAVENOUS | Status: AC
  Administered 2018-11-09: 4 g via INTRAVENOUS
  Filled 2018-11-09: qty 500

## 2018-11-09 MED ORDER — LABETALOL HCL 5 MG/ML IV SOLN: 40.0000 mg | INTRAVENOUS | Status: DC | PRN

## 2018-11-09 MED ORDER — LABETALOL HCL 5 MG/ML IV SOLN: 20.0000 mg | INTRAVENOUS | Status: DC | PRN

## 2018-11-09 MED ORDER — SODIUM CHLORIDE 0.9 % IV SOLN
5.0000 10*6.[IU] | Freq: Once | INTRAVENOUS | Status: AC
  Administered 2018-11-09: 5 10*6.[IU] via INTRAVENOUS
  Filled 2018-11-09: qty 5

## 2018-11-09 MED ORDER — HYDRALAZINE HCL 20 MG/ML IJ SOLN: 10.0000 mg | INTRAMUSCULAR | Status: DC | PRN

## 2018-11-09 MED ORDER — TERBUTALINE SULFATE 1 MG/ML IJ SOLN: 0.2500 mg | Freq: Once | INTRAMUSCULAR | Status: DC | PRN

## 2018-11-09 MED ORDER — LACTATED RINGERS IV SOLN: INTRAVENOUS | Status: DC

## 2018-11-09 MED ORDER — HYDRALAZINE HCL 20 MG/ML IJ SOLN
10.0000 mg | INTRAMUSCULAR | Status: DC | PRN
  Filled 2018-11-09: qty 1

## 2018-11-09 MED ORDER — OXYTOCIN 40 UNITS IN NORMAL SALINE INFUSION - SIMPLE MED
2.5000 [IU]/h | INTRAVENOUS | Status: DC
  Filled 2018-11-09: qty 1000

## 2018-11-09 MED ORDER — LABETALOL HCL 5 MG/ML IV SOLN: 80.0000 mg | INTRAVENOUS | Status: DC | PRN

## 2018-11-09 MED ORDER — LABETALOL HCL 5 MG/ML IV SOLN
80.0000 mg | INTRAVENOUS | Status: DC | PRN
  Administered 2018-11-09: 80 mg via INTRAVENOUS
  Filled 2018-11-09: qty 16

## 2018-11-09 MED ORDER — ACETAMINOPHEN 325 MG PO TABS: 650.0000 mg | ORAL_TABLET | ORAL | Status: DC | PRN

## 2018-11-09 MED ORDER — OXYTOCIN BOLUS FROM INFUSION
500.0000 mL | Freq: Once | INTRAVENOUS | Status: AC
  Administered 2018-11-10: 500 mL via INTRAVENOUS

## 2018-11-09 MED ORDER — LABETALOL HCL 5 MG/ML IV SOLN
20.0000 mg | INTRAVENOUS | Status: DC | PRN
  Administered 2018-11-09: 20 mg via INTRAVENOUS
  Filled 2018-11-09 (×2): qty 4

## 2018-11-09 MED ORDER — OXYCODONE-ACETAMINOPHEN 5-325 MG PO TABS: 1.0000 | ORAL_TABLET | ORAL | Status: DC | PRN

## 2018-11-09 MED ORDER — LIDOCAINE HCL (PF) 1 % IJ SOLN: 30.0000 mL | INTRAMUSCULAR | Status: DC | PRN

## 2018-11-09 MED ORDER — ONDANSETRON HCL 4 MG/2ML IJ SOLN: 4.0000 mg | Freq: Four times a day (QID) | INTRAMUSCULAR | Status: DC | PRN

## 2018-11-09 MED ORDER — LABETALOL HCL 5 MG/ML IV SOLN
20.0000 mg | INTRAVENOUS | Status: DC | PRN
  Filled 2018-11-09: qty 4

## 2018-11-09 MED ORDER — LABETALOL HCL 200 MG PO TABS
400.0000 mg | ORAL_TABLET | Freq: Three times a day (TID) | ORAL | Status: DC
  Administered 2018-11-09: 400 mg via ORAL
  Filled 2018-11-09 (×2): qty 2

## 2018-11-09 MED ORDER — MISOPROSTOL 50MCG HALF TABLET
50.0000 ug | ORAL_TABLET | ORAL | Status: DC | PRN
  Administered 2018-11-09 – 2018-11-10 (×3): 50 ug via BUCCAL
  Filled 2018-11-09 (×3): qty 1

## 2018-11-09 MED ORDER — LACTATED RINGERS IV SOLN: 500.0000 mL | INTRAVENOUS | Status: DC | PRN

## 2018-11-09 MED ORDER — POTASSIUM CHLORIDE 2 MEQ/ML IV SOLN
INTRAVENOUS | Status: DC
  Administered 2018-11-09: 17:00:00 via INTRAVENOUS
  Filled 2018-11-09 (×2): qty 1000

## 2018-11-09 MED ORDER — MAGNESIUM SULFATE 40 G IN LACTATED RINGERS - SIMPLE
2.0000 g/h | INTRAVENOUS | Status: AC
  Administered 2018-11-09 – 2018-11-10 (×3): 2 g/h via INTRAVENOUS
  Administered 2018-11-10: 3 g/h via INTRAVENOUS
  Filled 2018-11-09 (×3): qty 500

## 2018-11-09 MED ORDER — LABETALOL HCL 5 MG/ML IV SOLN
INTRAVENOUS | Status: AC
  Administered 2018-11-09: 80 mg via INTRAVENOUS
  Filled 2018-11-09: qty 8

## 2018-11-09 MED ORDER — HYDRALAZINE HCL 20 MG/ML IJ SOLN
5.0000 mg | INTRAMUSCULAR | Status: DC | PRN
  Administered 2018-11-09 – 2018-11-12 (×2): 5 mg via INTRAMUSCULAR
  Filled 2018-11-09: qty 1

## 2018-11-09 MED ORDER — DIPHENHYDRAMINE HCL 50 MG/ML IJ SOLN
12.5000 mg | Freq: Once | INTRAMUSCULAR | Status: AC
  Administered 2018-11-09: 12.5 mg via INTRAVENOUS
  Filled 2018-11-09: qty 1

## 2018-11-09 MED ORDER — PENICILLIN G 3 MILLION UNITS IVPB - SIMPLE MED
3.0000 10*6.[IU] | INTRAVENOUS | Status: DC
  Administered 2018-11-10 (×2): 3 10*6.[IU] via INTRAVENOUS
  Filled 2018-11-09 (×2): qty 100

## 2018-11-09 MED ORDER — LABETALOL HCL 5 MG/ML IV SOLN
40.0000 mg | INTRAVENOUS | Status: DC | PRN
  Administered 2018-11-09: 40 mg via INTRAVENOUS

## 2018-11-09 MED ORDER — HYDRALAZINE HCL 20 MG/ML IJ SOLN: 5.0000 mg | INTRAMUSCULAR | Status: DC | PRN

## 2018-11-09 MED ORDER — METOCLOPRAMIDE HCL 5 MG/ML IJ SOLN
10.0000 mg | Freq: Once | INTRAMUSCULAR | Status: AC
  Administered 2018-11-09: 10 mg via INTRAVENOUS
  Filled 2018-11-09: qty 2

## 2018-11-09 MED ORDER — SOD CITRATE-CITRIC ACID 500-334 MG/5ML PO SOLN: 30.0000 mL | ORAL | Status: DC | PRN

## 2018-11-09 NOTE — Consult Note (Addendum)
NEURO HOSPITALIST CONSULT NOTE   Requestig physician: Dr. Vergie LivingPickens  Reason for Consult: Severe headache with confusion  History obtained from:  Patient, Husband and Chart     HPI:                                                                                                                                          Karen Booth is an 23 y.o. pregnant female at 3436 weeks gestation who presented to the Ob-Gyn service today with chief complaints of severe headache, confusion and visual scotoma. She was hypertensive on initial assessment and has been diagnosed with pre-eclampsia. Her SBP was initially noted to be > 200 and is currently at 162 after being administered IV magnesium, hydralazine and labetalol. Urine sample for protein is pending.   LKN was 0900, except for a headache which had begun yesterday night. The headache became so severe that she called her husband, who noted that her speech was slurred. She also complained of visual scotoma consisting of a blank spot in her vision without scintillation per interview during Neurology consult, but patient did endorse seeing flashing lights based on HPI from her H&P (see below). She has no significant prior history of headache. Her headache is now rated as 8/10.   HPI from her H&P was reviewed for additional information: "Karen Booth a 22 y.o.year old 722P1001 female at 61107w1d weeks gestation who [facility]U [facility] and assessed as "not acting right" with an elevated manual BP of 190/105 by Dr. Adrian BlackwaterStinson.She reports having a H/A and seeing flashing lights since last night. She has a known diagnosis of cHTN with superimposed PEC; not on meds. She had term delivery 4.5 yrs vaginal delivery."  Past Medical History:  Diagnosis Date  . Anxiety    never discussed or been treated, 'always had it, been coming up lately"  . Chlamydia   . Heart murmur   . Hypertension   . Infection    UTI  . Urinary tract  infection     Past Surgical History:  Procedure Laterality Date  . TONSILLECTOMY      Family History  Problem Relation Age of Onset  . Heart disease Mother        murmur, arrythmia  . Hypertension Mother   . Heart murmur Mother   . Arrhythmia Mother   . Hypertension Father   . Breast cancer Maternal Grandmother        and g-gma  . Breast cancer Paternal Grandmother   . Esophageal cancer Maternal Uncle   . Diabetes Paternal Aunt   . Stomach cancer Maternal Uncle   . Hearing loss Neg Hx               Social History:  reports that  she has quit smoking. Her smoking use included cigarettes. She has never used smokeless tobacco. She reports previous drug use. Drug: Marijuana. She reports that she does not drink alcohol.  Allergies  Allergen Reactions  . Ibuprofen Anaphylaxis, Hives and Swelling    Oral swelling    MEDICATIONS:                                                                                                                     Prior to Admission:  Medications Prior to Admission  Medication Sig Dispense Refill Last Dose  . acetaminophen (TYLENOL) 325 MG tablet Take 325 mg by mouth every 4 (four) hours as needed for headache.   11/09/2018 at Unknown time  . DICLEGIS 10-10 MG TBEC Take 2 tablets by mouth at bedtime. 60 tablet 3 11/09/2018 at Unknown time  . ondansetron (ZOFRAN ODT) 4 MG disintegrating tablet Take 1 tablet (4 mg total) by mouth every 8 (eight) hours as needed for nausea or vomiting. 30 tablet 2 Past Week at Unknown time  . pantoprazole (PROTONIX) 20 MG tablet Take 1 tablet (20 mg total) by mouth every morning. 30 tablet 3 11/08/2018 at Unknown time   Scheduled: . labetalol  400 mg Oral TID  . labetalol      . oxytocin 40 units in LR 1000 mL  500 mL Intravenous Once   Continuous: . lactated ringers    . lactated ringers    . magnesium sulfate 2 g/hr (11/09/18 1329)  . oxytocin       ROS:                                                                                                                                        No fever or myalgias. No cough. No neck pain. No limb weakness or numbness. No dysarthria. Denies vertigo or dizziness. Endorses feeling "fuzzy headed". No overt seizure activity. Positive for nausea. No diarrhea. Other ROS as per HPI.    Blood pressure (!) 146/101, pulse 87, temperature 98.7 F (37.1 C), temperature source Oral, resp. rate 20, last menstrual period 03/01/2018, SpO2 98 %.   General Examination:  Physical Exam  HEENT-  [place]/AT. No neck stiffness.   Lungs- Respirations unlabored.  Abdomen- Consistent with pregnancy Extremities- Mild pitting edema distal BLE  Neurological Examination Mental Status: Mildly sedated affect, oriented x 5. Able to name objects correctly, repeat a phrase and follow all commands. Speech fluent.  Able to follow a 3 step directional command. Cranial Nerves: II: Visual fields intact. PERRL.   III,IV, VI: EOMI without nystagmus. No ptosis.   VII: No facial droop. Sensation symmetric.  VIII: hearing intact to voice IX,X: Palate rises symmetrically XI: Head midline XII: Midline tongue extension Motor: Right : Upper extremity   5/5    Left:     Upper extremity   5/5  Lower extremity   5/5     Lower extremity   5/5 No pronator drift.  Sensory: Temp and light touch intact throughout, bilaterally. No extinction.  Deep Tendon Reflexes: 3+ bilateral brachioradialis and patellae.   Plantars: Right: downgoing   Left: downgoing Cerebellar: No ataxia with FNF bilaterally. Slower on the left than the right with pastpointing.  Gait: Deferred   Lab Results: Basic Metabolic Panel: Recent Labs  Lab 11/03/18 1614 11/09/18 1154  NA 140 139  K 3.9 3.3*  CL 107* 109  CO2 20 21*  GLUCOSE 81 89  BUN 10 10  CREATININE 0.66 0.89  CALCIUM 8.2* 8.5*    CBC: Recent Labs  Lab  11/03/18 1614 11/09/18 1154  WBC 8.3 10.7*  HGB 10.3* 11.2*  HCT 29.6* 34.8*  MCV 76* 79.5*  PLT 161 155    Cardiac Enzymes: No results for input(s): CKTOTAL, CKMB, CKMBINDEX, TROPONINI in the last 168 hours.  Lipid Panel: No results for input(s): CHOL, TRIG, HDL, CHOLHDL, VLDL, LDLCALC in the last 168 hours.  Imaging: No results found.   Assessment: 23 year old pregnant female with headache, confusion and severe HTN 1. DDx includes malignant HTN precipitating headache and confusion in the setting of preeclampsia possibly progressing to eclampsia. Also on DDx is cerebral venous sinus thrombosis and PRES. No lateralizing deficit on exam to suggest acute stroke.  2. Question of seizure like activity of new onset, with brief staring spells during which the patient does not respond verbally. No jerking or twitching noted by nursing staff or during exam.   Recommendations: 1. STAT MRI brain with MRA and MRV of head.  2. EEG after imaging 3. IV magnesium and eclampsia work up per Ob-Gyn 4. 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 MRV without thrombus. MRA without LVO or critical stenosis. Images reviewed. Plan to include BP management and eclampsia/pre-eclampsia management. EEG has been completed with results pending. Discussed with Dr. Vergie Living.  A total of 85 minutes was spent in the emergent neurological evaluation and management of this critically ill patient. Time spent included coordination of care and imaging review.    Electronically signed: Dr. Caryl Pina 11/09/2018, 2:28 PM

## 2018-11-09 NOTE — H&P (Signed)
History   CSN: [id number]  Arrival date and time: 11/09/18 1130   First Provider Initiated Contact with Patient 11/09/18 1201         Chief Complaint  Patient presents with  . Hypertension  . Headache   HPI  Ms.  Karen Booth is a 23 y.o. year old G73P1001 female at 105w1d weeks gestation who presents to MAU after being seen at [facility] and assessed as "not acting right" with an elevated manual BP of 190/105 by Dr. Adrian Blackwater. She reports having a H/A and seeing flashing lights since last night. She has a known diagnosis of cHTN with superimposed PEC; not on meds. She had term delivery 4.5 yrs vaginal delivery.       Past Medical History:  Diagnosis Date  . Anxiety    never discussed or been treated, 'always had it, been coming up lately"  . Chlamydia   . Heart murmur   . Hypertension   . Infection    UTI  . Urinary tract infection          Past Surgical History:  Procedure Laterality Date  . TONSILLECTOMY           Family History  Problem Relation Age of Onset  . Heart disease Mother        murmur, arrythmia  . Hypertension Mother   . Heart murmur Mother   . Arrhythmia Mother   . Hypertension Father   . Breast cancer Maternal Grandmother        and g-gma  . Breast cancer Paternal Grandmother   . Esophageal cancer Maternal Uncle   . Diabetes Paternal Aunt   . Stomach cancer Maternal Uncle   . Hearing loss Neg Hx     Social History        Tobacco Use  . Smoking status: Former Smoker    Types: Cigarettes  . Smokeless tobacco: Never Used  . Tobacco comment: quit  before preg  Substance Use Topics  . Alcohol use: No  . Drug use: Not Currently    Types: Marijuana    Comment: last use 30 May 2018    Allergies:       Allergies  Allergen Reactions  . Ibuprofen Anaphylaxis, Hives and Swelling    Oral swelling           Medications Prior to Admission  Medication Sig Dispense Refill Last Dose  . acetaminophen  (TYLENOL) 325 MG tablet Take 325 mg by mouth every 4 (four) hours as needed for headache.   11/09/2018 at Unknown time  . DICLEGIS 10-10 MG TBEC Take 2 tablets by mouth at bedtime. 60 tablet 3 11/09/2018 at Unknown time  . ondansetron (ZOFRAN ODT) 4 MG disintegrating tablet Take 1 tablet (4 mg total) by mouth every 8 (eight) hours as needed for nausea or vomiting. 30 tablet 2 Past Week at Unknown time  . pantoprazole (PROTONIX) 20 MG tablet Take 1 tablet (20 mg total) by mouth every morning. 30 tablet 3 11/08/2018 at Unknown time    Review of Systems  Constitutional: Negative.   HENT: Negative.   Eyes: Negative.   Respiratory: Negative.   Cardiovascular: Negative.   Gastrointestinal: Positive for nausea and vomiting.  Endocrine: Negative.   Genitourinary: Negative.   Musculoskeletal: Negative.   Skin: Negative.   Allergic/Immunologic: Negative.   Neurological: Positive for dizziness and headaches.  Hematological: Negative.   Psychiatric/Behavioral: Positive for confusion.   Physical Exam   Blood pressure (!) 198/150, pulse (!) 108, temperature  98.7 F (37.1 C), temperature source Oral, resp. rate 20, last menstrual period 03/01/2018, SpO2 98 %.  Physical Exam  Nursing note and vitals reviewed. Constitutional: She appears well-developed and well-nourished. She appears lethargic.  HENT:  Head: Normocephalic and atraumatic.  Eyes: Pupils are equal, round, and reactive to light.  Neck: Normal range of motion.  Cardiovascular: Normal rate, regular rhythm, normal heart sounds and intact distal pulses.  Respiratory: Effort normal and breath sounds normal.  GI: Soft. Bowel sounds are normal.  Genitourinary:    Genitourinary Comments: Pelvic deferred   Musculoskeletal: Normal range of motion.  Neurological: She has normal reflexes. She appears lethargic. She is disoriented. She exhibits abnormal muscle tone. Coordination abnormal.  Skin: Skin is warm and dry.  Psychiatric: She has  a normal mood and affect. Her behavior is normal. Judgment and thought content normal.    MAU Course  Procedures  MDM PEC W/U: CBC CMP P/C Ratio Serial BP's  *Consult with Dr. Debroah Loop @ 1243 - notified of patient's complaints, assessments, lab & U/S results -- requested to come see pt; recommended tx plan head CT, MgSO4  4 gram bolus then 2 gram infusion, admit to L&D for IOL d/t cHTN with SIPEC // Dr. Lenice Pressman in NICU notified by Dr. Debroah Loop @ 1252 about admission -- ok to admit for IOL  Prenatal Transfer Tool  Maternal Diabetes: No Genetic Screening: Normal Maternal Ultrasounds/Referrals: Normal Fetal Ultrasounds or other Referrals:  None Maternal Substance Abuse:  No Significant Maternal Medications:  None Significant Maternal Lab Results: None GBS unknown   LabResultsLast24Hours       Results for orders placed or performed during the hospital encounter of 11/09/18 (from the past 24 hour(s))  CBC     Status: Abnormal   Collection Time: 11/09/18 11:54 AM  Result Value Ref Range   WBC 10.7 (H) 4.0 - 10.5 K/uL   RBC 4.38 3.87 - 5.11 MIL/uL   Hemoglobin 11.2 (L) 12.0 - 15.0 g/dL   HCT 24.8 (L) 25.0 - 03.7 %   MCV 79.5 (L) 80.0 - 100.0 fL   MCH 25.6 (L) 26.0 - 34.0 pg   MCHC 32.2 30.0 - 36.0 g/dL   RDW 04.8 (H) 88.9 - 16.9 %   Platelets 155 150 - 400 K/uL   nRBC 0.4 (H) 0.0 - 0.2 %  Comprehensive metabolic panel     Status: Abnormal   Collection Time: 11/09/18 11:54 AM  Result Value Ref Range   Sodium 139 135 - 145 mmol/L   Potassium 3.3 (L) 3.5 - 5.1 mmol/L   Chloride 109 98 - 111 mmol/L   CO2 21 (L) 22 - 32 mmol/L   Glucose, Bld 89 70 - 99 mg/dL   BUN 10 6 - 20 mg/dL   Creatinine, Ser 4.50 0.44 - 1.00 mg/dL   Calcium 8.5 (L) 8.9 - 10.3 mg/dL   Total Protein 5.9 (L) 6.5 - 8.1 g/dL   Albumin 2.7 (L) 3.5 - 5.0 g/dL   AST 56 (H) 15 - 41 U/L   ALT 40 0 - 44 U/L   Alkaline Phosphatase 89 38 - 126 U/L   Total Bilirubin 0.7 0.3 - 1.2  mg/dL   GFR calc non Af Amer >60 >60 mL/min   GFR calc Af Amer >60 >60 mL/min   Anion gap 9 5 - 15      Assessment and Plan  Pre-eclampsia superimposed on chronic hypertension, antepartum - Admit to L&D for IOL  - Admission & IOL orders  entered by Dr. Debroah Loop - Head CT; MgSO4 4g LD, then 2g/hr; Hydralazine protocol - Dr. Aneta Mins notified of admission -- in to evaluate pt for suspected seizure activity prior to transfer to L&D   Raelyn Mora, MSN, CNM 11/09/2018, 12:13 PM Adam Phenix, MD

## 2018-11-09 NOTE — Progress Notes (Signed)
NOtified by Pt's MAU RN that pt was tearful in admitting history of depression and anxiety and stated that she hadn't said it out loud before.  She had a traumatic birth with her first child and has been very worried about this delivery.  Before I was able to see the patient, she had been transferred to labor and delivery and a code stroke had been called.  I provided emotional support to pt's partner, who presents as calm and stated, "I couldn't be better", but pt was tearful and wide eyed.  She acknowledge feelings of anxiety and worry.  When she was notified she'd need an MRI, she asked me to accompany her.  I walked with her down the hall encouraging her to practice some mindfulness and express her feelings.  She shared that she finds comfort in the music of the band, [employer], and that she also appreciates prayer, which she requested and I provided.  Pt is expecting a daughter, Karen Booth, and has a son Karen Booth who is four.  She reports her first labor was complicated by her son's size-stating that he "got stuck."  Encouraged Karen Booth to voice her concerns to her medical team and continue to practice deep breathing to focus her thoughts away from her spiraling anxiety.    Will continue to follow.  Please page as further needs arise.  Maryanna Shape. Carley Hammed, M.Div. [facility] Chaplain Pager [phone] Office [phone]

## 2018-11-09 NOTE — Progress Notes (Signed)
Dr. Aneta Mins notified of patient's severe anxiety, depression, and previous thoughts of harming herself "a couple days ago." Patient has no plans  to carry out suicide and does not currently have thoughts of harming herself.

## 2018-11-09 NOTE — MAU Provider Note (Signed)
History     CSN: [id number]  Arrival date and time: 11/09/18 1130   First Provider Initiated Contact with Patient 11/09/18 1201      Chief Complaint  Patient presents with  . Hypertension  . Headache   HPI  Ms.  Karen Booth is a 23 y.o. year old G46P1001 female at [redacted]w[redacted]d weeks gestation who presents to MAU after being seen at [facility] and assessed as "not acting right" with an elevated manual BP of 190/105 by Dr. Adrian Blackwater. She reports having a H/A and seeing flashing lights since last night. She has a known diagnosis of cHTN with superimposed PEC; not on meds. She had term delivery 4.5 yrs vaginal delivery.   Past Medical History:  Diagnosis Date  . Anxiety    never discussed or been treated, 'always had it, been coming up lately"  . Chlamydia   . Heart murmur   . Hypertension   . Infection    UTI  . Urinary tract infection     Past Surgical History:  Procedure Laterality Date  . TONSILLECTOMY      Family History  Problem Relation Age of Onset  . Heart disease Mother        murmur, arrythmia  . Hypertension Mother   . Heart murmur Mother   . Arrhythmia Mother   . Hypertension Father   . Breast cancer Maternal Grandmother        and g-gma  . Breast cancer Paternal Grandmother   . Esophageal cancer Maternal Uncle   . Diabetes Paternal Aunt   . Stomach cancer Maternal Uncle   . Hearing loss Neg Hx     Social History   Tobacco Use  . Smoking status: Former Smoker    Types: Cigarettes  . Smokeless tobacco: Never Used  . Tobacco comment: quit  before preg  Substance Use Topics  . Alcohol use: No  . Drug use: Not Currently    Types: Marijuana    Comment: last use 30 May 2018    Allergies:  Allergies  Allergen Reactions  . Ibuprofen Anaphylaxis, Hives and Swelling    Oral swelling    Medications Prior to Admission  Medication Sig Dispense Refill Last Dose  . acetaminophen (TYLENOL) 325 MG tablet Take 325 mg by mouth every 4 (four) hours as needed for  headache.   11/09/2018 at Unknown time  . DICLEGIS 10-10 MG TBEC Take 2 tablets by mouth at bedtime. 60 tablet 3 11/09/2018 at Unknown time  . ondansetron (ZOFRAN ODT) 4 MG disintegrating tablet Take 1 tablet (4 mg total) by mouth every 8 (eight) hours as needed for nausea or vomiting. 30 tablet 2 Past Week at Unknown time  . pantoprazole (PROTONIX) 20 MG tablet Take 1 tablet (20 mg total) by mouth every morning. 30 tablet 3 11/08/2018 at Unknown time    Review of Systems  Constitutional: Negative.   HENT: Negative.   Eyes: Negative.   Respiratory: Negative.   Cardiovascular: Negative.   Gastrointestinal: Positive for nausea and vomiting.  Endocrine: Negative.   Genitourinary: Negative.   Musculoskeletal: Negative.   Skin: Negative.   Allergic/Immunologic: Negative.   Neurological: Positive for dizziness and headaches.  Hematological: Negative.   Psychiatric/Behavioral: Positive for confusion.   Physical Exam   Blood pressure (!) 198/150, pulse (!) 108, temperature 98.7 F (37.1 C), temperature source Oral, resp. rate 20, last menstrual period 03/01/2018, SpO2 98 %.  Physical Exam  Nursing note and vitals reviewed. Constitutional: She appears well-developed and well-nourished.  She appears lethargic.  HENT:  Head: Normocephalic and atraumatic.  Eyes: Pupils are equal, round, and reactive to light.  Neck: Normal range of motion.  Cardiovascular: Normal rate, regular rhythm, normal heart sounds and intact distal pulses.  Respiratory: Effort normal and breath sounds normal.  GI: Soft. Bowel sounds are normal.  Genitourinary:    Genitourinary Comments: Pelvic deferred   Musculoskeletal: Normal range of motion.  Neurological: She has normal reflexes. She appears lethargic. She is disoriented. She exhibits abnormal muscle tone. Coordination abnormal.  Skin: Skin is warm and dry.  Psychiatric: She has a normal mood and affect. Her behavior is normal. Judgment and thought content  normal.    MAU Course  Procedures  MDM PEC W/U: CBC CMP P/C Ratio Serial BP's  *Consult with Dr. Debroah Loop @ 1243 - notified of patient's complaints, assessments, lab & U/S results -- requested to come see pt; recommended tx plan head CT, MgSO4  4 gram bolus then 2 gram infusion, admit to L&D for IOL d/t cHTN with SIPEC // Dr. Lenice Pressman in NICU notified by Dr. Debroah Loop @ 1252 about admission -- ok to admit for IOL  Results for orders placed or performed during the hospital encounter of 11/09/18 (from the past 24 hour(s))  CBC     Status: Abnormal   Collection Time: 11/09/18 11:54 AM  Result Value Ref Range   WBC 10.7 (H) 4.0 - 10.5 K/uL   RBC 4.38 3.87 - 5.11 MIL/uL   Hemoglobin 11.2 (L) 12.0 - 15.0 g/dL   HCT 15.7 (L) 26.2 - 03.5 %   MCV 79.5 (L) 80.0 - 100.0 fL   MCH 25.6 (L) 26.0 - 34.0 pg   MCHC 32.2 30.0 - 36.0 g/dL   RDW 59.7 (H) 41.6 - 38.4 %   Platelets 155 150 - 400 K/uL   nRBC 0.4 (H) 0.0 - 0.2 %  Comprehensive metabolic panel     Status: Abnormal   Collection Time: 11/09/18 11:54 AM  Result Value Ref Range   Sodium 139 135 - 145 mmol/L   Potassium 3.3 (L) 3.5 - 5.1 mmol/L   Chloride 109 98 - 111 mmol/L   CO2 21 (L) 22 - 32 mmol/L   Glucose, Bld 89 70 - 99 mg/dL   BUN 10 6 - 20 mg/dL   Creatinine, Ser 5.36 0.44 - 1.00 mg/dL   Calcium 8.5 (L) 8.9 - 10.3 mg/dL   Total Protein 5.9 (L) 6.5 - 8.1 g/dL   Albumin 2.7 (L) 3.5 - 5.0 g/dL   AST 56 (H) 15 - 41 U/L   ALT 40 0 - 44 U/L   Alkaline Phosphatase 89 38 - 126 U/L   Total Bilirubin 0.7 0.3 - 1.2 mg/dL   GFR calc non Af Amer >60 >60 mL/min   GFR calc Af Amer >60 >60 mL/min   Anion gap 9 5 - 15    Assessment and Plan  Pre-eclampsia superimposed on chronic hypertension, antepartum - Admit to L&D for IOL  - Admission & IOL orders entered by Dr. Debroah Loop - Head CT; MgSO4 4g LD, then 2g/hr; Hydralazine protocol - Dr. Aneta Mins notified of admission -- in to evaluate pt for suspected seizure activity prior to transfer to  L&D   Raelyn Mora, MSN, CNM 11/09/2018, 12:13 PM

## 2018-11-09 NOTE — MAU Note (Signed)
Magnesium Sulfate 4gm bolus in progress. Pt asked where her boyfriend was. Told her he was sitting on next to her left pt staring to her right side and became unresponsive for about 20-30 sec. Called out for Provider to come to bedside.  Attempted to tactile stimulate and pt started to repsond to name and started to answer orientation questions. Dr. Loistine Chance at bedside to assess patient. 1328 pt transported to L&D. Report given at bedside.

## 2018-11-09 NOTE — Progress Notes (Signed)
EEG completed, results pending. 

## 2018-11-09 NOTE — Progress Notes (Addendum)
LABOR PROGRESS NOTE  Karen Booth is a 23 y.o. G2P1001 at [redacted]w[redacted]d admitted for IOL for preE with severe features  Subjective: Mental status improved at this time Counseled patient on risks and benefits of primary section versus induction  Patient would like to proceed with induction   Objective: BP (!) 156/105   Pulse 87   Temp 98.7 F (37.1 C) (Oral)   Resp 15   Ht 5\' 1"  (1.549 m)   Wt 83.5 kg   LMP 03/01/2018   SpO2 98%   BMI 34.77 kg/m  or  Vitals:   11/09/18 1715 11/09/18 1745 11/09/18 1801 11/09/18 1831  BP: (!) 157/104 (!) 152/102 (!) 163/103 (!) 156/105  Pulse: 82 89 85 87  Resp: 15 17 16 15   Temp:      TempSrc:      SpO2:      Weight:      Height:        Dilation: Closed Presentation: Vertex Exam by:: Dr. Janina Mayo: baseline rate 120s, moderate varibility, + acel, no decel Toco: irregular contractions  Labs: Lab Results  Component Value Date   WBC 17.0 (H) 11/09/2018   HGB 11.7 (L) 11/09/2018   HCT 35.2 (L) 11/09/2018   MCV 78.9 (L) 11/09/2018   PLT 155 11/09/2018    Patient Active Problem List   Diagnosis Date Noted  . Preeclampsia complicating hypertension 11/09/2018  . PRES (posterior reversible encephalopathy syndrome) 11/09/2018  . Eclampsia affecting pregnancy in third trimester 11/09/2018  . Pre-eclampsia superimposed on chronic hypertension, antepartum 11/05/2018  . Chronic hypertension in third trimester 10/19/2018  . Traumatic birth 10/05/2018  . Supervision of high-risk pregnancy 05/21/2018  . Obesity in pregnancy 05/21/2018    Assessment / Plan: 23 y.o. G2P1001 at [redacted]w[redacted]d here for IOL for severe PreE  Labor: start with cytotec. Will eval for FB placement with next check  Fetal Wellbeing:  Cat 1 Pain Control:  Desires epidural later in labor  Anticipated MOD:  SVD Severe PreE: evaluated by neurology, likely PRES. EEG negative for seizure activity. Labs with slightly increased AST/ALT, platelets normal. Will repeat at 2300. Strict NPO  for now. Mental status improved from admission. Blood pressures also improved from admission.   Marquist Binstock,MD OB Fellow  11/09/2018, 6:39 PM

## 2018-11-09 NOTE — MAU Note (Signed)
Called clinic to see about BP check, sent over for eval.  Brought straight back, registration concerned.  Pt unable to follow some commands, (lift your hands/ arms up- pt repeated a few times, confused look on face, lifted rt arm, left only an inch or two).  Bad headache, started last night- getting worse- worse with any movement, did not take anything. Denies visual changes, epigastric or increase in swelling.   Started throwing up this morning- feels sick after moving.

## 2018-11-09 NOTE — Progress Notes (Addendum)
L&D Note  11/09/2018 - 3:52 PM  23 y.o. G2P1001 [redacted]w[redacted]d. Pregnancy complicated by cHTN, h/o traumatic birth  Patient Active Problem List   Diagnosis Date Noted  . Preeclampsia complicating hypertension 11/09/2018  . PRES (posterior reversible encephalopathy syndrome) 11/09/2018  . Eclampsia affecting pregnancy in third trimester 11/09/2018  . Pre-eclampsia superimposed on chronic hypertension, antepartum 11/05/2018  . Chronic hypertension in third trimester 10/19/2018  . Traumatic birth 10/05/2018  . Supervision of high-risk pregnancy 05/21/2018  . Obesity in pregnancy 05/21/2018    Ms. Koreena Nogle is admitted for eclampsia   Subjective:  Pt feeling much better.  Objective:   Vitals:   11/09/18 1346 11/09/18 1351 11/09/18 1400 11/09/18 1401  BP: (!) 160/107 (!) 172/100 (!) 165/108 (!) 146/101  Pulse: 87 89 90 87  Resp:      Temp:      TempSrc:      SpO2:        Current Vital Signs 24h Vital Sign Ranges  T 98.7 F (37.1 C) Temp  Avg: 98.7 F (37.1 C)  Min: 98.7 F (37.1 C)  Max: 98.7 F (37.1 C)  BP (!) 146/101 BP  Min: 146/101  Max: 198/150  HR 87 Pulse  Avg: 88.9  Min: 70  Max: 108  RR (!) 22 Resp  Avg: 21  Min: 20  Max: 22  SaO2 98 %   SpO2  Avg: 98 %  Min: 98 %  Max: 98 %       24 Hour I/O Current Shift I/O  Time Ins Outs No intake/output data recorded. No intake/output data recorded.  UOP: pt about to try and void via bed pan  FHR: 125 baseline, +accels, no decel, mod variability Toco: q50m Gen: NAD SVE: deferred  Bedside u/s: full bladder, cephalic, subjectively normal AFI  Labs:  Recent Labs  Lab 11/03/18 1614 11/09/18 1154  WBC 8.3 10.7*  HGB 10.3* 11.2*  HCT 29.6* 34.8*  PLT 161 155   Recent Labs  Lab 11/03/18 1614 11/09/18 1154  NA 140 139  K 3.9 3.3*  CL 107* 109  CO2 20 21*  BUN 10 10  CREATININE 0.66 0.89  CALCIUM 8.2* 8.5*  PROT 5.3* 5.9*  BILITOT 0.3 0.7  ALKPHOS 90 89  ALT 14 40  AST 16 56*  GLUCOSE 81 89     Medications Current Facility-Administered Medications  Medication Dose Route Frequency Provider Last Rate Last Dose  . acetaminophen (TYLENOL) tablet 650 mg  650 mg Oral Q4H PRN Adam Phenix, MD      . hydrALAZINE (APRESOLINE) injection 5 mg  5 mg Intramuscular PRN Raelyn Mora, CNM   5 mg at 11/09/18 1240   And  . hydrALAZINE (APRESOLINE) injection 10 mg  10 mg Intravenous PRN Raelyn Mora, CNM   10 mg at 11/09/18 1258   And  . labetalol (NORMODYNE,TRANDATE) injection 20 mg  20 mg Intravenous PRN Raelyn Mora, CNM   20 mg at 11/09/18 1317  . labetalol (NORMODYNE,TRANDATE) injection 20 mg  20 mg Intravenous PRN Gwenevere Abbot, MD       And  . labetalol (NORMODYNE,TRANDATE) injection 40 mg  40 mg Intravenous PRN Gwenevere Abbot, MD   40 mg at 11/09/18 1352   And  . labetalol (NORMODYNE,TRANDATE) injection 80 mg  80 mg Intravenous PRN Gwenevere Abbot, MD       And  . hydrALAZINE (APRESOLINE) injection 10 mg  10 mg Intravenous PRN Gwenevere Abbot, MD      .  labetalol (NORMODYNE) tablet 400 mg  400 mg Oral TID Gwenevere Abbot, MD      . labetalol (NORMODYNE,TRANDATE) 5 MG/ML injection           . lactated ringers 1,000 mL with potassium chloride 60 mEq infusion   Intravenous Continuous Wolverton Bing, MD      . lactated ringers infusion 500-1,000 mL  500-1,000 mL Intravenous PRN Adam Phenix, MD      . lidocaine (PF) (XYLOCAINE) 1 % injection 30 mL  30 mL Subcutaneous PRN Adam Phenix, MD      . magnesium sulfate 40 grams in LR 500 mL OB infusion  2 g/hr Intravenous Continuous Raelyn Mora, CNM 25 mL/hr at 11/09/18 1329 2 g/hr at 11/09/18 1329  . ondansetron (ZOFRAN) injection 4 mg  4 mg Intravenous Q6H PRN Adam Phenix, MD      . oxytocin (PITOCIN) IV BOLUS FROM BAG  500 mL Intravenous Once Adam Phenix, MD      . oxytocin (PITOCIN) IV infusion 40 units in NS 1000 mL - Premix  2.5 Units/hr Intravenous Continuous Adam Phenix, MD      . sodium  citrate-citric acid (ORACIT) solution 30 mL  30 mL Oral Q2H PRN Adam Phenix, MD        Assessment & Plan:  Pt improved *IUP: category I with accels, fetal status reassuring; normal growth earlier this month *Eclampsia: d/w with Neurology and he feels MRI c/w PRES and goal is BP management. EEG just about to get started. If any new findings, he will let us know. Pt mentating much better than on presentation. Will repeat labs and add on liver studies and a Mg level. Continue Mg at 2/hr for now *Delivery: I told her that will come back and talk to her after her labs are back given her h/o traumatic birth *FEN/GI: will add KCL to MIVF. NPO *GBS: will obtain swab after I see her for her labs *Analgesia: no current needs  Cornelia Copa MD Attending Center for [facility])

## 2018-11-09 NOTE — Progress Notes (Signed)
Called to see patient in car - patient not feeling well. HA since last night. Tried to take tylenol, but vomited soon after. Having scotoma. Manual BP done by me: 190/105. Patient directed to MAU at [facility].   Levie Heritage, DO 11/09/2018 11:26 AM

## 2018-11-09 NOTE — Procedures (Signed)
  [facility] Edker Punt A. Gerilyn Pilgrim, MD     [email or web address]           HISTORY: The patient is a 23 year old female who presents with confusion and altered mental status in the setting of preeclampsia.  The clinical picture is worrisome for seizures.  MEDICATIONS:  Current Facility-Administered Medications:  .  acetaminophen (TYLENOL) tablet 650 mg, 650 mg, Oral, Q4H PRN, Adam Phenix, MD .  hydrALAZINE (APRESOLINE) injection 5 mg, 5 mg, Intramuscular, PRN, 5 mg at 11/09/18 1240 **AND** hydrALAZINE (APRESOLINE) injection 10 mg, 10 mg, Intravenous, PRN, 10 mg at 11/09/18 1258 **AND** labetalol (NORMODYNE,TRANDATE) injection 20 mg, 20 mg, Intravenous, PRN, 20 mg at 11/09/18 1317 **AND** [DISCONTINUED] labetalol (NORMODYNE,TRANDATE) injection 40 mg, 40 mg, Intravenous, PRN, Raelyn Mora, CNM .  labetalol (NORMODYNE,TRANDATE) injection 20 mg, 20 mg, Intravenous, PRN **AND** labetalol (NORMODYNE,TRANDATE) injection 40 mg, 40 mg, Intravenous, PRN, 40 mg at 11/09/18 1352 **AND** labetalol (NORMODYNE,TRANDATE) injection 80 mg, 80 mg, Intravenous, PRN, 80 mg at 11/09/18 1605 **AND** hydrALAZINE (APRESOLINE) injection 10 mg, 10 mg, Intravenous, PRN **AND** Measure blood pressure, , , Once, Gwenevere Abbot, MD .  labetalol (NORMODYNE) tablet 400 mg, 400 mg, Oral, TID, Gwenevere Abbot, MD .  lactated ringers 1,000 mL with potassium chloride 60 mEq infusion, , Intravenous, Continuous, Pickens, Charlie, MD, Last Rate: 75 mL/hr at 11/09/18 1657 .  lactated ringers infusion 500-1,000 mL, 500-1,000 mL, Intravenous, PRN, Adam Phenix, MD .  lidocaine (PF) (XYLOCAINE) 1 % injection 30 mL, 30 mL, Subcutaneous, PRN, Adam Phenix, MD .  magnesium sulfate 40 grams in LR 500 mL OB infusion, 3 g/hr, Intravenous, Continuous, Pickens, Charlie, MD, Last Rate: 37.5 mL/hr at 11/09/18 1720, 3 g/hr at 11/09/18 1720 .  misoprostol (CYTOTEC) tablet 50 mcg, 50 mcg, Buccal, Q4H PRN, Gwenevere Abbot, MD, 50  mcg at 11/09/18 1748 .  ondansetron (ZOFRAN) injection 4 mg, 4 mg, Intravenous, Q6H PRN, Adam Phenix, MD .  oxytocin (PITOCIN) IV BOLUS FROM BAG, 500 mL, Intravenous, Once, Adam Phenix, MD .  oxytocin (PITOCIN) IV infusion 40 units in NS 1000 mL - Premix, 2.5 Units/hr, Intravenous, Continuous, Adam Phenix, MD .  sodium citrate-citric acid (ORACIT) solution 30 mL, 30 mL, Oral, Q2H PRN, Adam Phenix, MD .  terbutaline (BRETHINE) injection 0.25 mg, 0.25 mg, Subcutaneous, Once PRN, Gwenevere Abbot, MD     ANALYSIS: A 16 channel recording using standard 10 20 measurements is conducted for 21 minutes.  The study is degraded by myogenic artifact, motion artifact and high impedance at times.  The background activity is that of a low amplitude beta range between 13 and 14 Hz.  There is beta activity observed in frontal areas.  Mostly awake activity is observed.  Photic stimulation and hyperventilation are not conducted.  There is no focal or lateral slowing.  There is no epileptiform activity is observed.   IMPRESSION: 1.  This recording of the awake and drowsy states is unremarkable.      Jolana Runkles A. Gerilyn Pilgrim, M.D.  Diplomate, Biomedical engineer of Psychiatry and Neurology ( Neurology).

## 2018-11-09 NOTE — Telephone Encounter (Signed)
The patient called in regarding complaints. Reporting a sever headache, light sensibility, recently diagnosed with preeclampsia. Also stated she is throwing up and back pain. Nurse communicated with pateint and informed to visit MAU immediately.

## 2018-11-09 NOTE — MAU Note (Signed)
Unable to obtain IV access  4 attempts total by 2 RN'S. IV team consult ordered and called. Changed order for Apresoline to IM until IV access can be obtained.

## 2018-11-09 NOTE — Progress Notes (Signed)
Report was called and informed of patient admission for severe preeclampsia G2P1 at 36.1.  MAU RN informed that patient was having possible seizures with weakness and confusion.  Magnesium Sulfate was started and HTN protocol initiated in MAU.  Patient arrived to labor and delivery at 1335.  Dr Debroah Loop and Loistine Chance at bedside as well.  Patient requested to move from stretcher to bed and was confused, unable to communicate and questionable seizure activity.  FOB at bedside and stated this was abnormal behavior for patient and was last seen "normal" at 0900.  He reported patient called him with complaints of "headache and not feeling right with confusion of where to go".  A brief neuro assessment was performed and revealed slight weakness of right arm and slight facial droop of mouth with stare.  Continued prompting resulted in delayed responses.  Requested a Code Stroke and agreed by providers at 1340.  Code Stroke team arrived at 1346 and assessment performed.  POC developed and patient to MRI at 1403.  Patient cleared of code stroke at 1547. See code stroke team notes related to further details.

## 2018-11-09 NOTE — Code Documentation (Signed)
Stroke Response RN responded to code stroke activation on 2SL&D. Patient reportedly presented to MAU today for headache, visual disturbance and nausea. She is [redacted] weeks pregnant. Patient noted to be hypertensive and has been treated with Labetalol and Magnesium Sulfate IV. Code stroke activated. Stroke team to the bedside. Patient assessed by the neurologist. No focal deficits on exam. Patient with complaints of headache. Patient transported to MRI with bedside RNs and stroke team. MRI completed and patient transported back to room. Code stroke canceled per neurologist. Bedside RN Tresa Res made aware.

## 2018-11-10 ENCOUNTER — Inpatient Hospital Stay (HOSPITAL_COMMUNITY): Payer: Medicaid Other | Admitting: Anesthesiology

## 2018-11-10 ENCOUNTER — Other Ambulatory Visit: Payer: Self-pay

## 2018-11-10 ENCOUNTER — Encounter: Payer: Self-pay | Admitting: Obstetrics & Gynecology

## 2018-11-10 DIAGNOSIS — O119 Pre-existing hypertension with pre-eclampsia, unspecified trimester: Secondary | ICD-10-CM

## 2018-11-10 DIAGNOSIS — O1424 HELLP syndrome, complicating childbirth: Secondary | ICD-10-CM

## 2018-11-10 DIAGNOSIS — F322 Major depressive disorder, single episode, severe without psychotic features: Secondary | ICD-10-CM

## 2018-11-10 LAB — COMPREHENSIVE METABOLIC PANEL
ALT: 41 U/L (ref 0–44)
ALT: 32 U/L (ref 0–44)
AST: 53 U/L — ABNORMAL HIGH (ref 15–41)
AST: 41 U/L (ref 15–41)
Albumin: 2.6 g/dL — ABNORMAL LOW (ref 3.5–5.0)
Albumin: 2.1 g/dL — ABNORMAL LOW (ref 3.5–5.0)
Alkaline Phosphatase: 89 U/L (ref 38–126)
Alkaline Phosphatase: 69 U/L (ref 38–126)
Anion gap: 7 (ref 5–15)
Anion gap: 9 (ref 5–15)
BUN: 9 mg/dL (ref 6–20)
BUN: 7 mg/dL (ref 6–20)
CO2: 18 mmol/L — ABNORMAL LOW (ref 22–32)
CO2: 19 mmol/L — ABNORMAL LOW (ref 22–32)
Calcium: 7.3 mg/dL — ABNORMAL LOW (ref 8.9–10.3)
Calcium: 6.7 mg/dL — ABNORMAL LOW (ref 8.9–10.3)
Chloride: 110 mmol/L (ref 98–111)
Chloride: 107 mmol/L (ref 98–111)
Creatinine, Ser: 0.88 mg/dL (ref 0.44–1.00)
Creatinine, Ser: 1.06 mg/dL — ABNORMAL HIGH (ref 0.44–1.00)
GFR calc Af Amer: >60 mL/min (ref >60)
GFR calc Af Amer: >60 mL/min (ref >60)
GFR calc non Af Amer: >60 mL/min (ref >60)
GFR calc non Af Amer: >60 mL/min (ref >60)
Glucose, Bld: 93 mg/dL (ref 70–99)
Glucose, Bld: 98 mg/dL (ref 70–99)
Potassium: 3.9 mmol/L (ref 3.5–5.1)
Potassium: 4.6 mmol/L (ref 3.5–5.1)
Sodium: 135 mmol/L (ref 135–145)
Sodium: 135 mmol/L (ref 135–145)
Total Bilirubin: 0.7 mg/dL (ref 0.3–1.2)
Total Bilirubin: 0.6 mg/dL (ref 0.3–1.2)
Total Protein: 5.9 g/dL — ABNORMAL LOW (ref 6.5–8.1)
Total Protein: 4.6 g/dL — ABNORMAL LOW (ref 6.5–8.1)

## 2018-11-10 LAB — CBC
HCT: 27.6 % — ABNORMAL LOW (ref 36.0–46.0)
Hemoglobin: 8.8 g/dL — ABNORMAL LOW (ref 12.0–15.0)
MCH: 25.9 pg — ABNORMAL LOW (ref 26.0–34.0)
MCHC: 31.9 g/dL (ref 30.0–36.0)
MCV: 81.2 fL (ref 80.0–100.0)
Platelets: 118 10*3/uL — ABNORMAL LOW (ref 150–400)
RBC: 3.4 MIL/uL — ABNORMAL LOW (ref 3.87–5.11)
RDW: 16 % — ABNORMAL HIGH (ref 11.5–15.5)
WBC: 15.8 10*3/uL — ABNORMAL HIGH (ref 4.0–10.5)
nRBC: 0.2 % (ref 0.0–0.2)

## 2018-11-10 LAB — MAGNESIUM
Magnesium: 6.5 mg/dL (ref 1.7–2.4)
Magnesium: 7.8 mg/dL (ref 1.7–2.4)

## 2018-11-10 LAB — RPR: RPR Ser Ql: NONREACTIVE

## 2018-11-10 MED ORDER — DIPHENHYDRAMINE HCL 25 MG PO CAPS: 25.0000 mg | ORAL_CAPSULE | Freq: Four times a day (QID) | ORAL | Status: DC | PRN

## 2018-11-10 MED ORDER — ONDANSETRON HCL 4 MG PO TABS: 4.0000 mg | ORAL_TABLET | ORAL | Status: DC | PRN

## 2018-11-10 MED ORDER — LABETALOL HCL 5 MG/ML IV SOLN
80.0000 mg | INTRAVENOUS | Status: DC | PRN
  Administered 2018-11-11: 80 mg via INTRAVENOUS
  Filled 2018-11-10: qty 16

## 2018-11-10 MED ORDER — ONDANSETRON HCL 4 MG/2ML IJ SOLN: 4.0000 mg | INTRAMUSCULAR | Status: DC | PRN

## 2018-11-10 MED ORDER — MISOPROSTOL 200 MCG PO TABS
800.0000 ug | ORAL_TABLET | Freq: Once | ORAL | Status: AC
  Administered 2018-11-10: 800 ug via RECTAL

## 2018-11-10 MED ORDER — METHYLERGONOVINE MALEATE 0.2 MG/ML IJ SOLN
0.2000 mg | Freq: Once | INTRAMUSCULAR | Status: AC
  Administered 2018-11-10: 0.2 mg via INTRAMUSCULAR

## 2018-11-10 MED ORDER — WITCH HAZEL-GLYCERIN EX PADS: 1.0000 "application " | MEDICATED_PAD | CUTANEOUS | Status: DC | PRN

## 2018-11-10 MED ORDER — LACTATED RINGERS IV SOLN
500.0000 mL | Freq: Once | INTRAVENOUS | Status: AC
  Administered 2018-11-10: 500 mL via INTRAVENOUS

## 2018-11-10 MED ORDER — SERTRALINE HCL 50 MG PO TABS
25.0000 mg | ORAL_TABLET | Freq: Every day | ORAL | Status: DC
  Administered 2018-11-10 – 2018-11-12 (×3): 25 mg via ORAL
  Filled 2018-11-10 (×3): qty 1

## 2018-11-10 MED ORDER — SODIUM CHLORIDE (PF) 0.9 % IJ SOLN
INTRAMUSCULAR | Status: DC | PRN
  Administered 2018-11-10: 12 mL/h via EPIDURAL

## 2018-11-10 MED ORDER — SIMETHICONE 80 MG PO CHEW: 80.0000 mg | CHEWABLE_TABLET | ORAL | Status: DC | PRN

## 2018-11-10 MED ORDER — SENNOSIDES-DOCUSATE SODIUM 8.6-50 MG PO TABS: 2.0000 | ORAL_TABLET | Freq: Every evening | ORAL | Status: DC | PRN

## 2018-11-10 MED ORDER — METHYLERGONOVINE MALEATE 0.2 MG/ML IJ SOLN
INTRAMUSCULAR | Status: AC
  Administered 2018-11-10: 0.2 mg via INTRAMUSCULAR
  Filled 2018-11-10: qty 1

## 2018-11-10 MED ORDER — LABETALOL HCL 5 MG/ML IV SOLN
40.0000 mg | INTRAVENOUS | Status: DC | PRN
  Administered 2018-11-11: 40 mg via INTRAVENOUS
  Filled 2018-11-10: qty 8

## 2018-11-10 MED ORDER — FENTANYL-BUPIVACAINE-NACL 0.5-0.125-0.9 MG/250ML-% EP SOLN: 12.0000 mL/h | EPIDURAL | Status: DC | PRN

## 2018-11-10 MED ORDER — HYDRALAZINE HCL 20 MG/ML IJ SOLN
10.0000 mg | INTRAMUSCULAR | Status: DC | PRN
  Administered 2018-11-10 – 2018-11-11 (×2): 10 mg via INTRAVENOUS
  Filled 2018-11-10: qty 1

## 2018-11-10 MED ORDER — LABETALOL HCL 200 MG PO TABS
200.0000 mg | ORAL_TABLET | Freq: Two times a day (BID) | ORAL | Status: DC
  Administered 2018-11-10 – 2018-11-12 (×5): 200 mg via ORAL
  Filled 2018-11-10 (×5): qty 1

## 2018-11-10 MED ORDER — PRENATAL MULTIVITAMIN CH
1.0000 | ORAL_TABLET | Freq: Every day | ORAL | Status: DC
  Administered 2018-11-10 – 2018-11-12 (×3): 1 via ORAL
  Filled 2018-11-10 (×2): qty 1

## 2018-11-10 MED ORDER — LACTATED RINGERS IV SOLN
INTRAVENOUS | Status: DC
  Administered 2018-11-10 – 2018-11-11 (×2): via INTRAVENOUS

## 2018-11-10 MED ORDER — FENTANYL-BUPIVACAINE-NACL 0.5-0.125-0.9 MG/250ML-% EP SOLN
EPIDURAL | Status: AC
  Filled 2018-11-10: qty 250

## 2018-11-10 MED ORDER — OXYCODONE HCL 5 MG PO TABS
10.0000 mg | ORAL_TABLET | ORAL | Status: DC | PRN
  Administered 2018-11-10 – 2018-11-11 (×2): 10 mg via ORAL
  Filled 2018-11-10 (×2): qty 2

## 2018-11-10 MED ORDER — BENZOCAINE-MENTHOL 20-0.5 % EX AERO
1.0000 "application " | INHALATION_SPRAY | CUTANEOUS | Status: DC | PRN
  Administered 2018-11-10: 1 via TOPICAL
  Filled 2018-11-10: qty 56

## 2018-11-10 MED ORDER — COCONUT OIL OIL: 1.0000 "application " | TOPICAL_OIL | Status: DC | PRN

## 2018-11-10 MED ORDER — EPHEDRINE 5 MG/ML INJ
10.0000 mg | INTRAVENOUS | Status: DC | PRN
  Filled 2018-11-10: qty 2

## 2018-11-10 MED ORDER — MISOPROSTOL 200 MCG PO TABS
ORAL_TABLET | ORAL | Status: AC
  Administered 2018-11-10: 800 ug via RECTAL
  Filled 2018-11-10: qty 4

## 2018-11-10 MED ORDER — DIPHENHYDRAMINE HCL 50 MG/ML IJ SOLN: 12.5000 mg | INTRAMUSCULAR | Status: DC | PRN

## 2018-11-10 MED ORDER — LABETALOL HCL 5 MG/ML IV SOLN
20.0000 mg | INTRAVENOUS | Status: DC | PRN
  Administered 2018-11-10 – 2018-11-11 (×2): 20 mg via INTRAVENOUS
  Filled 2018-11-10: qty 4

## 2018-11-10 MED ORDER — TETANUS-DIPHTH-ACELL PERTUSSIS 5-2.5-18.5 LF-MCG/0.5 IM SUSP: 0.5000 mL | Freq: Once | INTRAMUSCULAR | Status: DC

## 2018-11-10 MED ORDER — OXYCODONE HCL 5 MG PO TABS
5.0000 mg | ORAL_TABLET | ORAL | Status: DC | PRN
  Administered 2018-11-10 (×2): 5 mg via ORAL
  Filled 2018-11-10 (×2): qty 1

## 2018-11-10 MED ORDER — DIBUCAINE 1 % RE OINT: 1.0000 "application " | TOPICAL_OINTMENT | RECTAL | Status: DC | PRN

## 2018-11-10 MED ORDER — PHENYLEPHRINE 40 MCG/ML (10ML) SYRINGE FOR IV PUSH (FOR BLOOD PRESSURE SUPPORT)
80.0000 ug | PREFILLED_SYRINGE | INTRAVENOUS | Status: DC | PRN
  Filled 2018-11-10: qty 10

## 2018-11-10 MED ORDER — LIDOCAINE HCL (PF) 1 % IJ SOLN
INTRAMUSCULAR | Status: DC | PRN
  Administered 2018-11-10: 5 mL via EPIDURAL

## 2018-11-10 NOTE — Anesthesia Procedure Notes (Signed)
Epidural Patient location during procedure: OB Start time: 11/10/2018 4:52 AM End time: 11/10/2018 5:09 AM  Staffing Anesthesiologist: Trevor Iha, MD Performed: anesthesiologist   Preanesthetic Checklist Completed: patient identified, site marked, surgical consent, pre-op evaluation, timeout performed, IV checked, risks and benefits discussed and monitors and equipment checked  Epidural Patient position: sitting Prep: site prepped and draped and DuraPrep Patient monitoring: continuous pulse ox and blood pressure Approach: midline Location: L3-L4 Injection technique: LOR air  Needle:  Needle type: Tuohy  Needle gauge: 17 G Needle length: 9 cm and 9 Needle insertion depth: 9 cm Catheter type: closed end flexible Catheter size: 19 Gauge Catheter at skin depth: 14 cm Test dose: negative  Assessment Events: blood not aspirated, injection not painful, no injection resistance, negative IV test and no paresthesia  Additional Notes Patient identified. Risks/Benefits/Options discussed with patient including but not limited to bleeding, infection, nerve damage, paralysis, failed block, incomplete pain control, headache, blood pressure changes, nausea, vomiting, reactions to medication both or allergic, itching and postpartum back pain. Confirmed with bedside nurse the patient's most recent platelet count. Confirmed with patient that they are not currently taking any anticoagulation, have any bleeding history or any family history of bleeding disorders. Patient expressed understanding and wished to proceed. All questions were answered. Sterile technique was used throughout the entire procedure. Please see nursing notes for vital signs. Test dose was given through epidural needle and negative prior to continuing to dose epidural or start infusion. Warning signs of high block given to the patient including shortness of breath, tingling/numbness in hands, complete motor block, or any concerning  symptoms with instructions to call for help. Patient was given instructions on fall risk and not to get out of bed. All questions and concerns addressed with instructions to call with any issues. 1 Attempt (S) . Patient tolerated procedure well.

## 2018-11-10 NOTE — Progress Notes (Signed)
LABOR PROGRESS NOTE  Karen Booth is a 23 y.o. G2P1001 at [redacted]w[redacted]d admitted for IOL for preE with severe features  Subjective: Normal mentation. Patient feeling well without headache or other concerns.   Objective: BP 134/85   Pulse 85   Temp 98.3 F (36.8 C) (Oral)   Resp 14   Ht 5\' 1"  (1.549 m)   Wt 83.5 kg   LMP 03/01/2018   SpO2 98%   BMI 34.77 kg/m  or  Vitals:   11/10/18 0000 11/10/18 0030 11/10/18 0100 11/10/18 0130  BP: (!) 135/91 125/81 (!) 141/80 134/85  Pulse: 97 94 91 85  Resp: 14 16 14 14   Temp: 98.3 F (36.8 C)     TempSrc: Oral     SpO2:      Weight:      Height:        Dilation: Closed Effacement (%): Thick Station: Ballotable Presentation: Vertex Exam by:: Paris Lore RN FHT: baseline rate 120s, moderate varibility, no acel, no decel Toco: irregular contractions  Labs: Lab Results  Component Value Date   WBC 15.6 (H) 11/09/2018   HGB 11.1 (L) 11/09/2018   HCT 33.0 (L) 11/09/2018   MCV 78.9 (L) 11/09/2018   PLT 156 11/09/2018    Patient Active Problem List   Diagnosis Date Noted  . Preeclampsia complicating hypertension 11/09/2018  . PRES (posterior reversible encephalopathy syndrome) 11/09/2018  . Eclampsia affecting pregnancy in third trimester 11/09/2018  . Pre-eclampsia superimposed on chronic hypertension, antepartum 11/05/2018  . Chronic hypertension in third trimester 10/19/2018  . Traumatic birth 10/05/2018  . Supervision of high-risk pregnancy 05/21/2018  . Obesity in pregnancy 05/21/2018    Assessment / Plan: 23 y.o. G2P1001 at 107w1d here for IOL for severe PreE  Labor: Patient remains fingertip. S/p cytotec x3.  Fetal Wellbeing:  Cat 1 Pain Control:  Desires epidural later in labor  Anticipated MOD:  SVD Severe PreE: evaluated by neurology, likely PRES. EEG negative for seizure activity. AST remains slightly elevated at 53 but improved from prior and ALT within normal limits. Mg++ level therapeutic at 6.1 on 3g. Cr stable.  Patient asymptomatic. BPs moderately elevated. Consider starting PO therapy. Will repeat labs at 0700.   De Hollingshead, DO  OB Fellow  11/10/2018, 2:17 AM

## 2018-11-10 NOTE — Anesthesia Postprocedure Evaluation (Signed)
Anesthesia Post Note  Patient: Karen Booth  Procedure(s) Performed: AN AD HOC LABOR EPIDURAL     Patient location during evaluation: Mother Baby Anesthesia Type: Epidural Level of consciousness: awake Pain management: satisfactory to patient Vital Signs Assessment: post-procedure vital signs reviewed and stable Respiratory status: spontaneous breathing Cardiovascular status: stable Anesthetic complications: no    Last Vitals:  Vitals:   11/10/18 1442 11/10/18 1548  BP: (!) 150/94 (!) 153/92  Pulse: 82 88  Resp: 18 18  Temp: 36.8 C 36.7 C  SpO2: 98% 99%    Last Pain:  Vitals:   11/10/18 1548  TempSrc: Oral  PainSc:    Pain Goal: Patients Stated Pain Goal: 3 (11/10/18 1330)                 Cephus Shelling

## 2018-11-10 NOTE — Anesthesia Preprocedure Evaluation (Addendum)
Anesthesia Evaluation  Patient identified by MRN, date of birth, ID band Patient awake    Reviewed: Allergy & Precautions, NPO status , Patient's Chart, lab work & pertinent test results  Airway Mallampati: II  TM Distance: >3 FB Neck ROM: Full    Dental no notable dental hx. (+) Teeth Intact   Pulmonary former smoker,    Pulmonary exam normal breath sounds clear to auscultation       Cardiovascular hypertension, Normal cardiovascular exam Rhythm:Regular Rate:Normal  Chronic HTN  With Superimposed severe Pre eclmapsia 190s/105 On Mg   Neuro/Psych Seizures -,  Anxiety ? Seizure activity this Admission 3/31 Posterior Reversable Encephalopaty Syndrome    GI/Hepatic negative GI ROS, Neg liver ROS,   Endo/Other    Renal/GU negative Renal ROS     Musculoskeletal   Abdominal   Peds  Hematology Hgb 11.1 Plt 156   Anesthesia Other Findings   Reproductive/Obstetrics (+) Pregnancy                             Anesthesia Physical Anesthesia Plan  ASA: III  Anesthesia Plan: Epidural   Post-op Pain Management:    Induction:   PONV Risk Score and Plan:   Airway Management Planned:   Additional Equipment:   Intra-op Plan:   Post-operative Plan:   Informed Consent: I have reviewed the patients History and Physical, chart, labs and discussed the procedure including the risks, benefits and alternatives for the proposed anesthesia with the patient or authorized representative who has indicated his/her understanding and acceptance.       Plan Discussed with:   Anesthesia Plan Comments: (36.2 Wk G2P1 w c HTN , ? Eclampsia and PRES syndrome, on Mg for LEA)        Anesthesia Quick Evaluation

## 2018-11-10 NOTE — Progress Notes (Signed)
Critical magnesium of 7.8 called to dr Debroah Loop and s earl rn

## 2018-11-10 NOTE — Progress Notes (Addendum)
NEUROLOGY PROGRESS NOTE  Subjective: Doing much better, feeling very tired but no further visual issues or headache  Exam: Vitals:   11/10/18 0800 11/10/18 0815  BP: 113/90 110/72  Pulse: (!) 180 67  Resp: 16 16  Temp:    SpO2:      Physical Exam   HEENT-  Normocephalic, no lesions, without obvious abnormality.  Normal external eye and conjunctiva.   Extremities- Warm, dry and intact Musculoskeletal-no joint tenderness, deformity or swelling Skin-warm and dry, no hyperpigmentation, vitiligo, or suspicious lesions   Neuro:  Mental Status: Alert, oriented, thought content appropriate.  Speech fluent without evidence of aphasia.  Able to follow 3 step commands without difficulty. Cranial Nerves: II:  Visual fields grossly normal,  III,IV, VI: ptosis not present, extra-ocular motions intact bilaterally pupils equal, round, reactive to light and accommodation V,VII: smile symmetric, facial light touch sensation normal bilaterally VIII: hearing normal bilaterally IX,X: Palate rises midline XI: bilateral shoulder shrug XII: midline tongue extension Motor: Right : Upper extremity   5/5    Left:     Upper extremity   5/5  Lower extremity   5/5     Lower extremity   5/5 Tone and bulk:normal tone throughout; no atrophy noted Sensory: Pinprick and light touch intact throughout, bilaterally Deep Tendon Reflexes: 3+ and symmetric throughout Plantars: Right: downgoing   Left: downgoing Cerebellar: normal finger-to-nose, normal rapid alternating movements and normal heel-to-shin test     Medications:  Scheduled: . pencillin G potassium IV  3 Million Units Intravenous Q4H    Pertinent Labs/Diagnostics:   Mr Shirlee Latch QA Contrast  Result Date: 11/09/2018 CLINICAL DATA:  Suspected PRES.  Headache.  Flashing lights. EXAM: MRA HEAD WITHOUT CONTRAST TECHNIQUE: Angiographic images of the Circle of Willis were obtained using MRA technique without intravenous contrast. COMPARISON:  MR  brain and MR venogram reported separately. FINDINGS: The internal carotid arteries are widely patent. The basilar artery is widely patent with vertebrals codominant. There is no proximal MCA stenosis. The RIGHT A1 ACA is dominant, with hypoplasia of the LEFT A1 ACA. Both posterior cerebral arteries are widely patent throughout their pocket proximal on ambient segments. There is no visible cerebellar branch occlusion. No saccular aneurysm. No features suggestive of intracranial atherosclerotic disease. IMPRESSION: Negative MRA intracranial circulation. Electronically Signed   By: Elsie Stain M.D.   On: 11/09/2018 15:53   Mr Brain Wo Contrast  Result Date: 11/09/2018 CLINICAL DATA:  Elevated blood pressure.  Preeclampsia or eclampsia. EXAM: MRI HEAD WITHOUT CONTRAST TECHNIQUE: Multiplanar, multiecho pulse sequences of the brain and surrounding structures were obtained without intravenous contrast. COMPARISON:  None. FINDINGS: The patient was unable to remain motionless for the exam. Small or subtle lesions could be overlooked. Brain: Sequelae of atypical/severe PRES, with BILATERAL RIGHT greater than LEFT parieto-occipital cortical and subcortical edema in this patient with severe hypertension and pregnancy consistent with preeclampsia/eclampsia as the etiology. Atypical but relatively symmetric edema more anteriorly of the BILATERAL basal ganglia, and RIGHT hippocampus. The RIGHT parasagittal parietal and medial occipital cortex demonstrate asymmetric DWI hyperintensity, with a heterogeneous ADC map revealing corresponding areas of low, "normal", and increased signal; foci of irreversible infarction are possible. See for instance series/image 5/81, 6/31. Continued surveillance is warranted. No hemorrhage visible on gradient sequence, nor is there T1 shortening. Vascular: Reported separately Skull and upper cervical spine: Motion degraded, no gross abnormality Sinuses/Orbits: Mild mucosal thickening of the  sinuses. Negative orbits. Other: None. IMPRESSION: Severe PRES, with predominant involvement of the  RIGHT greater than LEFT parieto-occipital cortex and white matter as well as the BILATERAL basal ganglia; clinically, related to preeclampsia/eclampsia. Atypical, and concerning, hyperintense DWI signal in the RIGHT parasagittal parietal and occipital cortex; continued surveillance warranted with regard to the possibility of acute cerebral infarction. These results were called by telephone at the time of interpretation on 11/09/2018 at 3:30 pm to Dr. Otelia Limes , who verbally acknowledged these results. Electronically Signed   By: Elsie Stain M.D.   On: 11/09/2018 15:48   Mr Susie Cassette Head  Result Date: 11/09/2018 CLINICAL DATA:  Suspected PRES with eclampsia. Headache. Elevated blood pressure. Flashing lights. EXAM: MR VENOGRAM the HEAD WITHOUT CONTRAST TECHNIQUE: Angiographic images of the intracranial venous structures were obtained using MRV technique without intravenous contrast. COMPARISON:  MR brain and MRA reported separately. FINDINGS: The sagittal sinus is widely patent. Both transverse sinuses are widely patent, RIGHT dominant. Both sigmoid sinuses are widely patent. Internal cerebral veins, and vein of Galen are patent but poorly seen. No definite cortical venous thrombosis. IMPRESSION: No features suggestive of sinovenous occlusive disease. Electronically Signed   By: Elsie Stain M.D.   On: 11/09/2018 15:52   EEG: No epileptiform activity  Felicie Morn PA-C [facility] [phone]   Assessment: 23 year old female with severe pre-eclampsia and PRES on MRI. Clinically improved with BP now well controlled. EEG showed no epileptiform activity.  Recommendations: 1. Continue to keep BP stable 2. Will need Neurology follow up and repeat MRI brain in 3 months to assess for resolution versus residual chronic imaging sequelae of PRES  3. Neurolohospitalist service will S/O at this time.  Please call if there are any additional questions.    Electronically signed: Dr. Caryl Pina 11/10/2018, 8:22 AM

## 2018-11-10 NOTE — Progress Notes (Signed)
Follow up with Karen Booth today.  She shared her delivery of Karen Booth was stressful and similar to her last experience.    We discussed her feelings of depression and anxiety, which her MAU nurse had informed me about.  She acknowledged that she has had these feelings since her son was born and said, "I just can't stay happy."  I normalized her feelings of sadness, anger, grief, worry despite the positive outcome and encouraged her to continue talk about these feelings.  We also talked about how people sometimes want to focus on the positive outcome without leaving room for the grief for the experience you'd hoped for and the benefit of being able to talk to someone who makes room for happy and sad to exist at the same time.    I also encouraged her to continue to be in conversation with her provider about these feelings.  I also made a referral to Irven Baltimore the behavioral health clinician at the clinic where she gets care.  Please page as further needs arise.  Maryanna Shape. Carley Hammed, M.Div. [facility] Chaplain Pager [phone] Office [phone]       11/10/18 1229  Clinical Encounter Type  Visited With Patient  Visit Type Follow-up;Spiritual support  Spiritual Encounters  Spiritual Needs Emotional  Stress Factors  Patient Stress Factors Health changes;Loss of control;Major life changes

## 2018-11-10 NOTE — Consult Note (Signed)
[facility] Face-to-Face Psychiatry Consult   Reason for Consult:  "Severe anxiety and depression around labor with history of traumatic birth. Also some suicidal thoughts a few days ago. No plan." Referring Physician:  Dr. [place] Bing  Patient Identification: Karen Booth MRN:  [id number] Principal Diagnosis: MDD (major depressive disorder), single episode, severe , no psychosis (HCC) Diagnosis:  Active Problems:   Pre-eclampsia superimposed on chronic hypertension, antepartum   Preeclampsia complicating hypertension   PRES (posterior reversible encephalopathy syndrome)   Eclampsia affecting pregnancy in third trimester   Total Time spent with patient: 1 hour  Subjective:   Karen Booth is a G2P1 23 y.o. female patient admitted with preeclampsia.  HPI:   Per chart review, patient is [redacted] weeks pregnant and admitted with preeclampsia. She was seen by neurology for management of PRES. Psychiatry is consulted due to severe anxiety and depression related to labor. She has a history of prior traumatic childbirth when she had her 5 y/o son. She endorses suicidal thoughts a couple days ago.   On interview, Karen Booth reports that she is tired secondary to delivering her newborn daughter this morning. She reports that her daughter is currently in the NICU. She reports similar problems in her first pregnancy due to an "abnormally shaped pelvis." She reports worsening depression and anxiety since the birth of her son 4 years ago. She reports, "I try so much to be happy." She reports feelings of happiness since having her daughter that are not sustained. She reports generalized worries and poor energy level. She denies any thoughts to harm herself or others. She does admit to fleeting suicidal thoughts in the past without any plan or intention to harm self. She denies a history of suicide attempts. She denies AVH. She denies a history of manic symptoms (decreased need for sleep, increased energy, pressured speech or  euphoria). She reports poor sleep and denies problems with appetite. Her boyfriend is at bedside with her verbal consent. He is the father of both of her children. She reports that he is supportive. He denies any safety concerns for her. He is agreeable to assisting her with follow up with outpatient mental health services.   Past Psychiatric History: Anxiety   Risk to Self: Is patient at risk for suicide?: No, but patient needs Medical Clearance Risk to Others:  None. Denies HI.  Prior Inpatient Therapy:  Denies  Prior Outpatient Therapy:  Denies   Past Medical History:  Past Medical History:  Diagnosis Date  . Anxiety    never discussed or been treated, 'always had it, been coming up lately"  . Chlamydia   . Heart murmur   . Hypertension   . Infection    UTI  . Urinary tract infection     Past Surgical History:  Procedure Laterality Date  . TONSILLECTOMY     Family History:  Family History  Problem Relation Age of Onset  . Heart disease Mother        murmur, arrythmia  . Hypertension Mother   . Heart murmur Mother   . Arrhythmia Mother   . Hypertension Father   . Breast cancer Maternal Grandmother        and g-gma  . Breast cancer Paternal Grandmother   . Esophageal cancer Maternal Uncle   . Diabetes Paternal Aunt   . Stomach cancer Maternal Uncle   . Hearing loss Neg Hx    Family Psychiatric  History: Denies  Social History:  Social History   Substance and Sexual  Activity  Alcohol Use No     Social History   Substance and Sexual Activity  Drug Use Not Currently  . Types: Marijuana   Comment: last use 30 May 2018    Social History   Socioeconomic History  . Marital status: Single    Spouse name: Not on file  . Number of children: Not on file  . Years of education: Not on file  . Highest education level: Not on file  Occupational History  . Not on file  Social Needs  . Financial resource strain: Not on file  . Food insecurity:    Worry: Not on  file    Inability: Not on file  . Transportation needs:    Medical: Not on file    Non-medical: Not on file  Tobacco Use  . Smoking status: Former Smoker    Types: Cigarettes  . Smokeless tobacco: Never Used  . Tobacco comment: quit  before preg  Substance and Sexual Activity  . Alcohol use: No  . Drug use: Not Currently    Types: Marijuana    Comment: last use 30 May 2018  . Sexual activity: Yes    Birth control/protection: None  Lifestyle  . Physical activity:    Days per week: Not on file    Minutes per session: Not on file  . Stress: Not on file  Relationships  . Social connections:    Talks on phone: Not on file    Gets together: Not on file    Attends religious service: Not on file    Active member of club or organization: Not on file    Attends meetings of clubs or organizations: Not on file    Relationship status: Not on file  Other Topics Concern  . Not on file  Social History Narrative  . Not on file   Additional Social History: She lives with her boyfriend's parents and their 91 y/o son. She works at [employer]. She denies alcohol or illicit substance use.      Allergies:   Allergies  Allergen Reactions  . Ibuprofen Anaphylaxis, Hives and Swelling    Oral swelling    Labs:  Results for orders placed or performed during the hospital encounter of 11/09/18 (from the past 48 hour(s))  CBC     Status: Abnormal   Collection Time: 11/09/18 11:54 AM  Result Value Ref Range   WBC 10.7 (H) 4.0 - 10.5 K/uL   RBC 4.38 3.87 - 5.11 MIL/uL   Hemoglobin 11.2 (L) 12.0 - 15.0 g/dL   HCT 41.3 (L) 24.4 - 01.0 %   MCV 79.5 (L) 80.0 - 100.0 fL   MCH 25.6 (L) 26.0 - 34.0 pg   MCHC 32.2 30.0 - 36.0 g/dL   RDW 27.2 (H) 53.6 - 64.4 %   Platelets 155 150 - 400 K/uL   nRBC 0.4 (H) 0.0 - 0.2 %    Comment: Performed at [facility], [address], [place], [place] [address]  Comprehensive metabolic panel     Status: Abnormal   Collection Time: 11/09/18 11:54 AM   Result Value Ref Range   Sodium 139 135 - 145 mmol/L   Potassium 3.3 (L) 3.5 - 5.1 mmol/L   Chloride 109 98 - 111 mmol/L   CO2 21 (L) 22 - 32 mmol/L   Glucose, Bld 89 70 - 99 mg/dL   BUN 10 6 - 20 mg/dL   Creatinine, Ser 2.59 0.44 - 1.00 mg/dL  Calcium 8.5 (L) 8.9 - 10.3 mg/dL   Total Protein 5.9 (L) 6.5 - 8.1 g/dL   Albumin 2.7 (L) 3.5 - 5.0 g/dL   AST 56 (H) 15 - 41 U/L   ALT 40 0 - 44 U/L   Alkaline Phosphatase 89 38 - 126 U/L   Total Bilirubin 0.7 0.3 - 1.2 mg/dL   GFR calc non Af Amer >60 >60 mL/min   GFR calc Af Amer >60 >60 mL/min   Anion gap 9 5 - 15    Comment: Performed at [facility], [address], [place], [place] [address]  Type and screen [facility]     Status: None   Collection Time: 11/09/18  3:55 PM  Result Value Ref Range   ABO/RH(D) O POS    Antibody Screen NEG    Sample Expiration      11/12/2018 Performed at [facility], [address] [place], [place] [address]   RPR     Status: None   Collection Time: 11/09/18  3:55 PM  Result Value Ref Range   RPR Ser Ql Non Reactive Non Reactive    Comment: (NOTE) Performed At: [facility] [address] [place], [place] [id number] Jolene Schimke MD HQ:[id number]   CBC     Status: Abnormal   Collection Time: 11/09/18  3:55 PM  Result Value Ref Range   WBC 17.0 (H) 4.0 - 10.5 K/uL   RBC 4.46 3.87 - 5.11 MIL/uL   Hemoglobin 11.7 (L) 12.0 - 15.0 g/dL   HCT 13.2 (L) 44.0 - 10.2 %   MCV 78.9 (L) 80.0 - 100.0 fL   MCH 26.2 26.0 - 34.0 pg   MCHC 33.2 30.0 - 36.0 g/dL   RDW 72.5 (H) 36.6 - 44.0 %   Platelets 155 150 - 400 K/uL   nRBC 0.3 (H) 0.0 - 0.2 %    Comment: Performed at [facility], [address], [place], [place] [address]  Comprehensive metabolic panel     Status: Abnormal   Collection Time: 11/09/18  3:55 PM  Result Value Ref Range   Sodium 139 135 - 145 mmol/L   Potassium 3.6 3.5 - 5.1 mmol/L   Chloride 110 98 - 111 mmol/L   CO2 20 (L) 22 - 32  mmol/L   Glucose, Bld 88 70 - 99 mg/dL   BUN 9 6 - 20 mg/dL   Creatinine, Ser 5.95 0.44 - 1.00 mg/dL   Calcium 8.3 (L) 8.9 - 10.3 mg/dL   Total Protein 6.2 (L) 6.5 - 8.1 g/dL   Albumin 2.8 (L) 3.5 - 5.0 g/dL   AST 64 (H) 15 - 41 U/L   ALT 47 (H) 0 - 44 U/L   Alkaline Phosphatase 98 38 - 126 U/L   Total Bilirubin 1.1 0.3 - 1.2 mg/dL   GFR calc non Af Amer >60 >60 mL/min   GFR calc Af Amer >60 >60 mL/min   Anion gap 9 5 - 15    Comment: Performed at [facility], [address], [place], [place] [address]  Fibrinogen     Status: Abnormal   Collection Time: 11/09/18  3:55 PM  Result Value Ref Range   Fibrinogen 633 (H) 210 - 475 mg/dL    Comment: Performed at [facility], [address], [place], [place] [address]  Protime-INR     Status: None   Collection Time: 11/09/18  3:55 PM  Result Value  Ref Range   Prothrombin Time 12.5 11.4 - 15.2 seconds   INR 0.9 0.8 - 1.2    Comment: (NOTE) INR goal varies based on device and disease states. Performed at [facility], [address], [place], [place] [address]   APTT     Status: None   Collection Time: 11/09/18  3:55 PM  Result Value Ref Range   aPTT 29 24 - 36 seconds    Comment: Performed at [facility], [address] [place], [place] [address]  Magnesium     Status: Abnormal   Collection Time: 11/09/18  3:55 PM  Result Value Ref Range   Magnesium 4.1 (H) 1.7 - 2.4 mg/dL    Comment: Performed at [facility], [address], [place], [place] [address]  ABO/Rh     Status: None   Collection Time: 11/09/18  3:55 PM  Result Value Ref Range   ABO/RH(D)      O POS Performed at [facility], [address], [place], [place] [address]   Culture, beta strep (group b only)     Status: None (Preliminary result)   Collection Time: 11/09/18  5:27 PM  Result Value Ref Range   Specimen Description VAGINAL/RECTAL    Special Requests NONE    Culture      CULTURE REINCUBATED FOR BETTER  GROWTH Performed at [facility], [address], [place], [place] [address]    Report Status PENDING   Protein / creatinine ratio, urine     Status: Abnormal   Collection Time: 11/09/18  8:21 PM  Result Value Ref Range   Creatinine, Urine 96.13 mg/dL   Total Protein, Urine 131 mg/dL    Comment: NO NORMAL RANGE ESTABLISHED FOR THIS TEST   Protein Creatinine Ratio 1.36 (H) 0.00 - 0.15 mg/mg[Cre]    Comment: Performed at [facility] Lab, [address] [place], [place] [address]  CBC     Status: Abnormal   Collection Time: 11/09/18 11:06 PM  Result Value Ref Range   WBC 15.6 (H) 4.0 - 10.5 K/uL   RBC 4.18 3.87 - 5.11 MIL/uL   Hemoglobin 11.1 (L) 12.0 - 15.0 g/dL   HCT 01.0 (L) 27.2 - 53.6 %   MCV 78.9 (L) 80.0 - 100.0 fL   MCH 26.6 26.0 - 34.0 pg   MCHC 33.6 30.0 - 36.0 g/dL   RDW 64.4 (H) 03.4 - 74.2 %   Platelets 156 150 - 400 K/uL   nRBC 0.1 0.0 - 0.2 %    Comment: Performed at [facility], [address] [place], [place] [address]  Comprehensive metabolic panel     Status: Abnormal   Collection Time: 11/09/18 11:06 PM  Result Value Ref Range   Sodium 135 135 - 145 mmol/L   Potassium 3.9 3.5 - 5.1 mmol/L   Chloride 110 98 - 111 mmol/L   CO2 18 (L) 22 - 32 mmol/L   Glucose, Bld 93 70 - 99 mg/dL   BUN 9 6 - 20 mg/dL   Creatinine, Ser 8.75 0.44 - 1.00 mg/dL   Calcium 7.3 (L) 8.9 - 10.3 mg/dL   Total Protein 5.9 (L) 6.5 - 8.1 g/dL   Albumin 2.6 (L) 3.5 - 5.0 g/dL   AST 53 (H) 15 - 41 U/L   ALT 41 0 - 44 U/L   Alkaline Phosphatase 89 38 - 126 U/L   Total Bilirubin 0.7 0.3 - 1.2  mg/dL   GFR calc non Af Amer >60 >60 mL/min   GFR calc Af Amer >60 >60 mL/min   Anion gap 7 5 - 15    Comment: Performed at [facility], [address], [place], [place] [address]  Magnesium     Status: Abnormal   Collection Time: 11/09/18 11:06 PM  Result Value Ref Range   Magnesium 6.5 (HH) 1.7 - 2.4 mg/dL    Comment: CRITICAL RESULT CALLED TO, READ BACK BY AND VERIFIED  WITH: K.SULLIVAN,RN 0044 11/10/2018 M.CAMPBELL Performed at [facility], [address] [place], [place] [address]   Comprehensive metabolic panel     Status: Abnormal   Collection Time: 11/10/18  8:14 AM  Result Value Ref Range   Sodium 135 135 - 145 mmol/L   Potassium 4.6 3.5 - 5.1 mmol/L   Chloride 107 98 - 111 mmol/L   CO2 19 (L) 22 - 32 mmol/L   Glucose, Bld 98 70 - 99 mg/dL   BUN 7 6 - 20 mg/dL   Creatinine, Ser 1.85 (H) 0.44 - 1.00 mg/dL   Calcium 6.7 (L) 8.9 - 10.3 mg/dL   Total Protein 4.6 (L) 6.5 - 8.1 g/dL   Albumin 2.1 (L) 3.5 - 5.0 g/dL   AST 41 15 - 41 U/L   ALT 32 0 - 44 U/L   Alkaline Phosphatase 69 38 - 126 U/L   Total Bilirubin 0.6 0.3 - 1.2 mg/dL   GFR calc non Af Amer >60 >60 mL/min   GFR calc Af Amer >60 >60 mL/min   Anion gap 9 5 - 15    Comment: Performed at [facility] Lab, [address], [place], [place] [address]  Magnesium     Status: Abnormal   Collection Time: 11/10/18  8:14 AM  Result Value Ref Range   Magnesium 7.8 (HH) 1.7 - 2.4 mg/dL    Comment: RESULTS CONFIRMED BY MANUAL DILUTION CRITICAL RESULT CALLED TO, READ BACK BY AND VERIFIED WITH: H STONE RN [id number] BY A BENNETT Performed at [facility], [address] [place], [place] [address]   CBC     Status: Abnormal   Collection Time: 11/10/18  8:14 AM  Result Value Ref Range   WBC 15.8 (H) 4.0 - 10.5 K/uL   RBC 3.40 (L) 3.87 - 5.11 MIL/uL   Hemoglobin 8.8 (L) 12.0 - 15.0 g/dL   HCT 25.7 (L) 50.5 - 18.3 %   MCV 81.2 80.0 - 100.0 fL   MCH 25.9 (L) 26.0 - 34.0 pg   MCHC 31.9 30.0 - 36.0 g/dL   RDW 35.8 (H) 25.1 - 89.8 %   Platelets 118 (L) 150 - 400 K/uL    Comment: REPEATED TO VERIFY SPECIMEN CHECKED FOR CLOTS    nRBC 0.2 0.0 - 0.2 %    Comment: Performed at [facility], [address], [place], [place] [address]    Current Facility-Administered Medications  Medication Dose Route Frequency Provider Last Rate Last Dose  . benzocaine-Menthol  (DERMOPLAST) 20-0.5 % topical spray 1 application  1 application Topical PRN [place] Bing, MD      . coconut oil  1 application Topical PRN [place] Bing, MD      . witch hazel-glycerin (TUCKS) pad 1 application  1 application Topical PRN [place] Bing, MD       And  . dibucaine (NUPERCAINAL) 1 % rectal ointment 1 application  1 application Rectal PRN  Bing, MD      .  diphenhydrAMINE (BENADRYL) capsule 25 mg  25 mg Oral Q6H PRN [place] Bing, MD      . diphenhydrAMINE (BENADRYL) injection 12.5 mg  12.5 mg Intravenous Q15 min PRN [place] Bing, MD      . ePHEDrine injection 10 mg  10 mg Intravenous PRN [place] Bing, MD      . ePHEDrine injection 10 mg  10 mg Intravenous PRN [place] Bing, MD      . fentaNYL 2 mcg/mL w/ bupivacaine 0.125% in NS 250 mL epidural infusion (WCC-ANES)  12 mL/hr Epidural Continuous PRN [place] Bing, MD      . hydrALAZINE (APRESOLINE) injection 5 mg  5 mg Intramuscular PRN [place] Bing, MD   5 mg at 11/09/18 1240   And  . hydrALAZINE (APRESOLINE) injection 10 mg  10 mg Intravenous PRN [place] Bing, MD   10 mg at 11/09/18 1258   And  . labetalol (NORMODYNE,TRANDATE) injection 20 mg  20 mg Intravenous PRN [place] Bing, MD   20 mg at 11/09/18 1317  . labetalol (NORMODYNE,TRANDATE) injection 20 mg  20 mg Intravenous PRN [place] Phenix, MD   20 mg at 11/10/18 1153   And  . labetalol (NORMODYNE,TRANDATE) injection 40 mg  40 mg Intravenous PRN [place] Phenix, MD       And  . labetalol (NORMODYNE,TRANDATE) injection 80 mg  80 mg Intravenous PRN [place] Phenix, MD       And  . hydrALAZINE (APRESOLINE) injection 10 mg  10 mg Intravenous PRN [place] Phenix, MD   10 mg at 11/10/18 1137  . labetalol (NORMODYNE) tablet 200 mg  200 mg Oral BID [place] Phenix, MD   200 mg at 11/10/18 1112  . lactated ringers infusion   Intravenous Continuous [place] Bing, MD      . magnesium sulfate 40 grams in LR 500 mL OB infusion  2 g/hr  Intravenous Continuous [place] Bing, MD 25 mL/hr at 11/10/18 1130 2 g/hr at 11/10/18 1130  . ondansetron (ZOFRAN) tablet 4 mg  4 mg Oral Q4H PRN [place] Bing, MD       Or  . ondansetron (ZOFRAN) injection 4 mg  4 mg Intravenous Q4H PRN [place] Bing, MD      . oxyCODONE (Oxy IR/ROXICODONE) immediate release tablet 10 mg  10 mg Oral Q4H PRN [place] Bing, MD      . oxyCODONE (Oxy IR/ROXICODONE) immediate release tablet 5 mg  5 mg Oral Q4H PRN [place] Bing, MD      . PHENYLephrine 40 mcg/ml in normal saline Adult IV Push Syringe  80 mcg Intravenous PRN [place] Bing, MD      . PHENYLephrine 40 mcg/ml in normal saline Adult IV Push Syringe  80 mcg Intravenous PRN [place] Bing, MD      . prenatal multivitamin tablet 1 tablet  1 tablet Oral Q1200 [place] Bing, MD      . senna-docusate (Senokot-S) tablet 2 tablet  2 tablet Oral QHS PRN [place] Bing, MD      . simethicone (MYLICON) chewable tablet 80 mg  80 mg Oral PRN [place] Bing, MD      . Melene Muller ON 11/11/2018] Tdap (BOOSTRIX) injection 0.5 mL  0.5 mL Intramuscular Once [place] Bing, MD        Musculoskeletal: Strength & Muscle Tone: within normal limits Gait & Station: UTA since patient is lying in bed. Patient leans: N/A  Psychiatric Specialty Exam: Physical Exam  Nursing note and vitals reviewed. Constitutional: She is oriented to person,  place, and time. She appears well-developed and well-nourished.  HENT:  Head: Normocephalic and atraumatic.  Neck: Normal range of motion.  Respiratory: Effort normal.  Musculoskeletal: Normal range of motion.  Neurological: She is alert and oriented to person, place, and time.  Psychiatric: Her speech is normal and behavior is normal. Judgment and thought content normal. Her mood appears anxious. Cognition and memory are normal. She exhibits a depressed mood.    Review of Systems  Gastrointestinal: Negative for constipation, diarrhea, nausea and vomiting.   Musculoskeletal: Positive for back pain.  Psychiatric/Behavioral: Positive for depression. Negative for hallucinations, substance abuse and suicidal ideas. The patient is nervous/anxious and has insomnia.   All other systems reviewed and are negative.   Blood pressure (!) 147/87, pulse 87, temperature 98.5 F (36.9 C), resp. rate 18, height  (1.549 m), weight 83.5 kg, last menstrual period 03/01/2018, SpO2 98 %.Body mass index is 34.77 kg/m.  General Appearance: Fairly Groomed, young, African American female, wearing a hospital gown who is lying in bed. NAD.   Eye Contact:  Good  Speech:  Clear and Coherent and Normal Rate  Volume:  Normal  Mood:  Anxious and Depressed  Affect:  Congruent  Thought Process:  Goal Directed, Linear and Descriptions of Associations: Intact  Orientation:  Full (Time, Place, and Person)  Thought Content:  Logical  Suicidal Thoughts:  No  Homicidal Thoughts:  No  Memory:  Immediate;   Good Recent;   Good Remote;   Good  Judgement:  Fair  Insight:  Fair  Psychomotor Activity:  Normal  Concentration:  Concentration: Good and Attention Span: Good  Recall:  Good  Fund of Knowledge:  Good  Language:  Good  Akathisia:  No  Handed:  Right  AIMS (if indicated):   N/A  Assets:  Communication Skills Desire for Improvement Financial Resources/Insurance Housing Intimacy Physical Health Resilience Social Support  ADL's:  Intact  Cognition:  WNL  Sleep:   Poor   Assessment:  Karen Booth is a 23 y.o. G2P1 female who was admitted with preeclampsia at 36 weeks. She endorses worsening depression and anxiety since the birth of her 65 y/o son. She has never received treatment for her mental health. She denies SI, HI or AVH. She denies a history of suicide attempts. She is help seeking and agrees to start an antidepressant and follow up with outpatient mental health resources. Her boyfriend and father of her newborn was present at bedside with her verbal  consent. He denies any safety concerns.   Treatment Plan Summary: -Start Zoloft 25 mg daily for depression and anxiety. Increase to 50 mg daily after 1 week.  -Start Melatonin 3 mg qhs to regulate sleep/wake cycle.  -Please have SW provide patient with outpatient mental health resources for medication management and therapy.  -Psychiatry will sign off on patient at this time. Please consult psychiatry again as needed.   Disposition: No evidence of imminent risk to self or others at present.   Patient does not meet criteria for psychiatric inpatient admission.  Cherly [place], DO 11/10/2018 12:11 PM

## 2018-11-10 NOTE — Progress Notes (Signed)
CRITICAL VALUE ALERT  Critical Value:  Magnesium 6.5   Date & Time Notied:  11/10/2018 @ 0046   Provider Notified: Dr. Earlene Plater  Orders Received/Actions taken: Keep Mag @ 3g

## 2018-11-10 NOTE — Progress Notes (Signed)
Order to stop Mag for one hour then restart at 2g/hr.

## 2018-11-11 LAB — CBC
HCT: 21.7 % — ABNORMAL LOW (ref 36.0–46.0)
HCT: 29.5 % — ABNORMAL LOW (ref 36.0–46.0)
Hemoglobin: 7 g/dL — ABNORMAL LOW (ref 12.0–15.0)
Hemoglobin: 9.7 g/dL — ABNORMAL LOW (ref 12.0–15.0)
MCH: 26 pg (ref 26.0–34.0)
MCH: 26.4 pg (ref 26.0–34.0)
MCHC: 32.3 g/dL (ref 30.0–36.0)
MCHC: 32.9 g/dL (ref 30.0–36.0)
MCV: 80.7 fL (ref 80.0–100.0)
MCV: 80.4 fL (ref 80.0–100.0)
Platelets: 123 10*3/uL — ABNORMAL LOW (ref 150–400)
Platelets: 149 10*3/uL — ABNORMAL LOW (ref 150–400)
RBC: 2.69 MIL/uL — ABNORMAL LOW (ref 3.87–5.11)
RBC: 3.67 MIL/uL — ABNORMAL LOW (ref 3.87–5.11)
RDW: 16.4 % — ABNORMAL HIGH (ref 11.5–15.5)
RDW: 15.7 % — ABNORMAL HIGH (ref 11.5–15.5)
WBC: 13.8 10*3/uL — ABNORMAL HIGH (ref 4.0–10.5)
WBC: 14.7 10*3/uL — ABNORMAL HIGH (ref 4.0–10.5)
nRBC: 0 % (ref 0.0–0.2)
nRBC: 0.1 % (ref 0.0–0.2)

## 2018-11-11 LAB — COMPREHENSIVE METABOLIC PANEL
ALT: 23 U/L (ref 0–44)
AST: 26 U/L (ref 15–41)
Albumin: 2 g/dL — ABNORMAL LOW (ref 3.5–5.0)
Alkaline Phosphatase: 57 U/L (ref 38–126)
Anion gap: 4 — ABNORMAL LOW (ref 5–15)
BUN: 9 mg/dL (ref 6–20)
CO2: 24 mmol/L (ref 22–32)
Calcium: 6.7 mg/dL — ABNORMAL LOW (ref 8.9–10.3)
Chloride: 106 mmol/L (ref 98–111)
Creatinine, Ser: 0.88 mg/dL (ref 0.44–1.00)
GFR calc Af Amer: >60 mL/min (ref >60)
GFR calc non Af Amer: >60 mL/min (ref >60)
Glucose, Bld: 93 mg/dL (ref 70–99)
Potassium: 4.6 mmol/L (ref 3.5–5.1)
Sodium: 134 mmol/L — ABNORMAL LOW (ref 135–145)
Total Bilirubin: 0.4 mg/dL (ref 0.3–1.2)
Total Protein: 4.3 g/dL — ABNORMAL LOW (ref 6.5–8.1)

## 2018-11-11 LAB — CULTURE, BETA STREP (GROUP B ONLY)

## 2018-11-11 LAB — PREPARE RBC (CROSSMATCH)

## 2018-11-11 MED ORDER — SODIUM CHLORIDE 0.9% IV SOLUTION
Freq: Once | INTRAVENOUS | Status: AC
  Administered 2018-11-11: 13:00:00 via INTRAVENOUS

## 2018-11-11 NOTE — Progress Notes (Signed)
Post Partum Day 1 Subjective: no complaints, voiding and tolerating PO  Objective: Blood pressure 135/82, pulse 82, temperature 98.3 F (36.8 C), temperature source Oral, resp. rate 18, height 5\' 1"  (1.549 m), weight 83.5 kg, last menstrual period 03/01/2018, SpO2 100 %.  Physical Exam:  General: alert, cooperative and no distress Lochia: appropriate Uterine Fundus: firm Incision: n/a DVT Evaluation: No evidence of DVT seen on physical exam.  Recent Labs    11/10/18 0814 11/11/18 0646  HGB 8.8* 7.0*  HCT 27.6* 21.7*    Assessment/Plan: Follow for sign of sx anemia   LOS: 2 days   Scheryl Darter 11/11/2018, 10:28 AM

## 2018-11-11 NOTE — Lactation Note (Signed)
This note was copied from a baby's chart. Lactation Consultation Note  Patient Name: Karen Booth INOMV'E Date: 11/11/2018 Reason for consult: Initial assessment;NICU baby;Late-preterm 34-36.6wks;Infant < 6lbs GHTN  Visited with P2 Mom of LPTI baby in the NICU.  Baby 24 hrs old, weighing <5 lbs.  Mom had GHTN, and history of anxiety and depression.  Psych consult yesterday.  Mom has Rx for Zoloft.  Mom just had her MgSO4 discontinued this am.  Did not feel up to initiating pumping yesterday.  Pump parts in room.  Mom is ready to commence double pumping this am.   Set up DEBP and assisted with first pumping.  Reviewed breast massage and hand expression.  Both breasts feel very firm, heavy and lots of swelling around nipple.  Unable to hand express any colostrum   Mom has WIC, referral sent, as Mom will need a DEBP on discharge.   Encouraged Mom to pump >8 times per 24 hrs, using breast massage and hand expression to help collect colostrum.  Mom has colostrum bullets in room.   Lactation brochure, and NICU brochure left with Mom.  Reviewed benefits of having baby STS and pumping and hand expressing afterwards.  Mom states she had a large milk supply with her first baby (4 yrs old).  Mom knows to ask for help prn.  Lactation Tools Discussed/Used Tools: Pump Breast pump type: Double-Electric Breast Pump WIC Program: Yes Pump Review: Setup, frequency, and cleaning;Milk Storage Initiated by:: Erby Pian RN IBCLC Date initiated:: 11/11/18   Consult Status Consult Status: Follow-up Date: 11/12/18 Follow-up type: In-patient    Judee Clara 11/11/2018, 8:52 AM

## 2018-11-12 ENCOUNTER — Encounter (HOSPITAL_COMMUNITY): Payer: Self-pay | Admitting: *Deleted

## 2018-11-12 LAB — BPAM RBC
BLOOD PRODUCT EXPIRATION DATE: 202004222359
ISSUE DATE / TIME: 202004021339
Unit Type and Rh: 5100

## 2018-11-12 LAB — TYPE AND SCREEN
ABO/RH(D): O POS
Antibody Screen: NEGATIVE
Unit division: 0

## 2018-11-12 MED ORDER — SERTRALINE HCL 50 MG PO TABS: 50.0000 mg | ORAL_TABLET | Freq: Every day | ORAL | 1 refills | Status: DC

## 2018-11-12 MED ORDER — LABETALOL HCL 200 MG PO TABS: 400.0000 mg | ORAL_TABLET | Freq: Two times a day (BID) | ORAL | 1 refills | Status: DC

## 2018-11-12 MED ORDER — LABETALOL HCL 200 MG PO TABS: 400.0000 mg | ORAL_TABLET | Freq: Two times a day (BID) | ORAL | Status: DC

## 2018-11-12 NOTE — Progress Notes (Signed)
Teaching complete  Pt to nicu  Questions answered

## 2018-11-12 NOTE — Clinical Social Work Maternal (Signed)
` CLINICAL SOCIAL WORK MATERNAL/CHILD NOTE  Patient Details  Name: Karen Booth MRN: [id number] Date of Birth: 07/09/1996  Date:  11/12/2018  Clinical Social Worker Initiating Note:  Caydon Feasel Boyd-Gilyard Date/Time: Initiated:  11/11/18/1329     Child's Name:  Karen Booth   Biological Parents:  Mother, Father   Need for Interpreter:  None   Reason for Referral:  Behavioral Health Concerns, Current Substance Use/Substance Use During Pregnancy    Address:  [address] [place] [address]    Phone number:  [phone] (home) [phone] (work)    Additional phone number:   Household Members/Support Persons (HM/SP):   Household Member/Support Person 1, Household Member/Support Person 2   HM/SP Name Relationship DOB or Age  HM/SP -1 Deven Booth Sr. FOB 11/02/1995  HM/SP -2 Deven Booth Jr.  son 04/07/2014  HM/SP -3        HM/SP -4        HM/SP -5        HM/SP -6        HM/SP -7        HM/SP -8          Natural Supports (not living in the home):  Extended Family, Immediate Family(per MOB, FOB's family will also provide supports. )   Professional Supports: None   Employment: Part-time(MOB works PRN at [employer])   Type of Work:     Education:  Some College   Homebound arranged:    Financial Resources:  Medicaid   Other Resources:  WIC, Food Stamps    Cultural/Religious Considerations Which May Impact Care:    Strengths:  Ability to meet basic needs , Understanding of illness, Pediatrician chosen, Compliance with medical plan , Home prepared for child , Psychotropic Medications   Psychotropic Medications:  Zoloft      Pediatrician:    [place] area  Pediatrician List:   [facility]  [place]    [place]    [place]    [place]    [place]      Pediatrician Fax Number:    Risk Factors/Current Problems:  Mental Health Concerns , Substance Use    Cognitive State:  Alert ,  Able to Concentrate    Mood/Affect:  Bright , Happy , Interested , Comfortable    CSW Assessment: CSW met with MOB in room 103 to complete an assessment for NICU admission, MH hx and SA hx.  When CSW arrived, MOB was resting in bed and FOB was laying on the couch.  With MOB's permission, CSW asked FOB to leave in order to assess MOB in private. MOB was polite, easy to engage, forthcoming, and receptive to meeting with CSW.   CSW inquired about MOB's thoughts and feeling regarding infant's admission to the NICU.  MOB shared a detail story of her traumatic birthing experience. MOB reported feeling overwhelmed and anxious prior to delivery due to her traumatic birth experience with her first child. MOB shared feeling scared and became tearful as she acknowledged " I had to be assessed to see if I had stroke."  CSW validated and normalized MOB's thoughts and feelings.  CSW discussed common emotions often experienced related to a NICU admission as well as during the first couple weeks of the postpartum period.  CSW asked about MOB's MH hx and MOB acknowledged a hx of anxiety and depression.  MOB was recently prescribed Zoloft and is eagerly waiting to see if medication will assist with managing her   symptoms. CSW also suggested outpatient counseling and MOB was receptive.  However, MOB communicated she wants to meet with behavioral specialist at [facility] and has an appointment scheduled. CSW praised MOB for being proactive. CSW also suggested that MOB follow-up with OB provider within the next 2-3 weeks as opposed to 6 weeks and MOB agreed (CSW spoke with bedside nurse and bedside nurse will communicate with provider). CSW provided education regarding the baby blues period vs. perinatal mood disorders, discussed treatment and gave resources for mental health follow up if concerns arise.  CSW recommends self-evaluation during the postpartum time period using the New Mom Checklist from  Postpartum Progress and encouraged MOB to contact a medical professional if symptoms are noted at any time.  MOB did not present with any acute MH symptoms however acknowledged SI within the last 7 days.  MOB shared that MOB does not have a plan and did not currently have any SI or HI.  CSW provided MOB with resources for MOB to contact if SI occurs again.  MOB reported having a good support team that consists of MOB's and FOB's immediate and extended family members.  CSW asked about MOB's SA hx and MOB acknowledged smoking marijuana during her 1st trimester, "To help decrease my nausea."  MOB reported her last use was October 2019.  MOB denied the use of all other illicit substances and CPS hx. CSW made MOB aware of hospitals's prenatal substance exposure policy and MOB was understanding. MOB is aware that CSW will monitor infant's CDS  and will make a report to [place] CPS if warranted.   CSW will continue to offer resources and supports to family while infant remains in NICU.   CSW Plan/Description:  Psychosocial Support and Ongoing Assessment of Needs, Sudden Infant Death Syndrome (SIDS) Education, Perinatal Mood and Anxiety Disorder (PMADs) Education, Hospital Drug Screen Policy Information, Other Information/Referral to Community Resources, CSW Will Continue to Monitor Umbilical Cord Tissue Drug Screen Results and Make Report if Warranted, Other Patient/Family Education   Gaston Dase Boyd-Gilyard, MSW, LCSW Clinical Social Work [phone]   Soriya Worster D BOYD-GILYARD, LCSW 11/12/2018, 9:40 AM 

## 2018-11-12 NOTE — Progress Notes (Signed)
B/P repeated 173/104  Med repeated following  Protocol  Will repeat in 

## 2018-11-12 NOTE — Discharge Summary (Signed)
Postpartum Discharge Summary     Patient Name: Karen Booth DOB: 07/01/1996 MRN: [id number]  Date of admission: 11/09/2018 Delivering Provider: Palmer Bing   Date of discharge: 11/12/2018  Admitting diagnosis: 36wks high bp unable to remember  Intrauterine pregnancy: [redacted]w[redacted]d     Secondary diagnosis:  Principal Problem:   MDD (major depressive disorder), single episode, severe , no psychosis (HCC) Active Problems:   Pre-eclampsia superimposed on chronic hypertension, antepartum   Preeclampsia complicating hypertension   PRES (posterior reversible encephalopathy syndrome)   Eclampsia affecting pregnancy in third trimester  Additional problems: none     Discharge diagnosis: Preterm Pregnancy Delivered, Preeclampsia (severe) and PRES                                                                                                Post partum procedures:none  Augmentation: Pitocin and Cytotec  Complications: None  Hospital course:  Induction of Labor With Vaginal Delivery   23 y.o. yo G2P1001 at [redacted]w[redacted]d was admitted to the hospital 11/09/2018 for induction of labor.  Indication for induction: Preeclampsia and PRES.  Patient had an uncomplicated labor course as follows: Membrane Rupture Time/Date: 6:29 AM ,11/10/2018   Intrapartum Procedures: Episiotomy: None [1]                                         Lacerations:  2nd degree [3];Perineal [11];Vaginal [6]  Patient had delivery of a Viable infant.  Information for the patient's newborn:  Zandria, Lafrance [[id number]]  Delivery Method: Vaginal, Forceps(Filed from Delivery Summary)   11/10/2018  Details of delivery can be found in separate delivery note.  Patient had a routine postpartum course. Patient is discharged home 11/12/18.  Magnesium Sulfate recieved: Yes BMZ received: No  Physical exam  Vitals:   11/12/18 0815 11/12/18 0840 11/12/18 0914 11/12/18 0932  BP: (!) 181/105 (!) 173/104 (!) 175/102 (!) 172/104  Pulse: 81 87 (!) 109  (!) 104  Resp:      Temp:      TempSrc:      SpO2: 99%     Weight:      Height:       General: alert, cooperative and no distress Lochia: appropriate Uterine Fundus: firm Incision: N/A DVT Evaluation: No evidence of DVT seen on physical exam. Labs: Lab Results  Component Value Date   WBC 14.7 (H) 11/11/2018   HGB 9.7 (L) 11/11/2018   HCT 29.5 (L) 11/11/2018   MCV 80.4 11/11/2018   PLT 149 (L) 11/11/2018   CMP Latest Ref Rng & Units 11/11/2018  Glucose 70 - 99 mg/dL 93  BUN 6 - 20 mg/dL 9  Creatinine 2.72 - 5.36 mg/dL 6.44  Sodium 034 - 742 mmol/L 134(L)  Potassium 3.5 - 5.1 mmol/L 4.6  Chloride 98 - 111 mmol/L 106  CO2 22 - 32 mmol/L 24  Calcium 8.9 - 10.3 mg/dL 6.7(L)  Total Protein 6.5 - 8.1 g/dL 4.3(L)  Total Bilirubin 0.3 - 1.2 mg/dL 0.4  Alkaline Phos  38 - 126 U/L 57  AST 15 - 41 U/L 26  ALT 0 - 44 U/L 23    Discharge instruction: per After Visit Summary and "Baby and Me Booklet".  After visit meds:  Allergies as of 11/12/2018      Reactions   Ibuprofen Anaphylaxis, Hives, Swelling   Oral swelling      Medication List    STOP taking these medications   acetaminophen 325 MG tablet Commonly known as:  TYLENOL   Diclegis 10-10 MG Tbec Generic drug:  Doxylamine-Pyridoxine   ondansetron 4 MG disintegrating tablet Commonly known as:  Zofran ODT   pantoprazole 20 MG tablet Commonly known as:  PROTONIX     TAKE these medications   labetalol 200 MG tablet Commonly known as:  NORMODYNE Take 2 tablets (400 mg total) by mouth 2 (two) times daily.   sertraline 50 MG tablet Commonly known as:  ZOLOFT Take 1 tablet (50 mg total) by mouth daily. Start taking on:  November 13, 2018       Diet: routine diet  Activity: Advance as tolerated. Pelvic rest for 6 weeks.   Outpatient follow up:1 week Follow up Appt: Future Appointments  Date Time Provider Department Center  11/24/2018  3:30 PM [facility] NURSE [facility]  11/24/2018  3:30 PM [facility] [place] 1 [facility]  [facility]   Follow up Visit: Follow-up Information    [facility] up in 1 week(s).   Specialty:  Obstetrics and Gynecology Why:  BP check Contact information: [address] [place] [address] [phone]           Please schedule this patient for Postpartum visit in: 1 week with the following provider: MD For C/S patients schedule nurse incision check in weeks 2 weeks: no High risk pregnancy complicated by: severe preeclampsia, altered MS at presentation with neurologyn evaluation, MRI, s/W PRES. Negative EEG Delivery mode:  low forceps Anticipated Birth Control:  Nexplanon PP Procedures needed: BP check  Schedule Integrated BH visit: no      Newborn Data: Live born female  Birth Weight: 4 lb 13.6 oz (2200 g) APGAR: 3, 7  Newborn Delivery   Birth date/time:  11/10/2018 07:11:00 Delivery type:  Vaginal, Forceps     Baby Feeding: Breast Disposition:NICU   11/12/2018 Scheryl Darter, MD

## 2018-11-12 NOTE — Discharge Instructions (Signed)
Hypertension During Pregnancy  Hypertension is also called high blood pressure. High blood pressure means that the force of your blood moving in your body is too strong. When you are pregnant, this condition should be watched carefully. It can cause problems for you and your baby. Follow these instructions at home: Eating and drinking   Drink enough fluid to keep your pee (urine) pale yellow.  Avoid caffeine. Lifestyle  Do not use any products that contain nicotine or tobacco, such as cigarettes and e-cigarettes. If you need help quitting, ask your doctor.  Do not use alcohol or drugs.  Avoid stress.  Rest and get plenty of sleep. General instructions  Take over-the-counter and prescription medicines only as told by your doctor.  While lying down, lie on your left side. This keeps pressure off your major blood vessels.  While sitting or lying down, raise (elevate) your feet. Try putting some pillows under your lower legs.  Exercise regularly. Ask your doctor what kinds of exercise are best for you.  Keep all prenatal and follow-up visits as told by your doctor. This is important. Contact a doctor if:  You have symptoms that your doctor told you to watch for, such as: ? Throwing up (vomiting). ? Feeling sick to your stomach (nausea). ? Headache. Get help right away if you have:  Very bad belly pain that does not get better with treatment.  A very bad headache that does not get better.  Throwing up that does not get better with treatment.  Sudden, fast weight gain.  Sudden swelling in your hands, ankles, or face.  Bleeding from your vagina.  Blood in your pee.  Fewer movements from your baby than usual.  Blurry vision.  Double vision.  Muscle twitching.  Sudden muscle tightening (spasms).  Trouble breathing.  Blue fingernails or lips. Summary  Hypertension is also called high blood pressure. High blood pressure means that the force of your blood moving  in your body is too strong.  When you are pregnant, this condition should be watched carefully. It can cause problems for you and your baby.  Get help right away if you have symptoms that your doctor told you to watch for. This information is not intended to replace advice given to you by your health care provider. Make sure you discuss any questions you have with your health care provider. Document Released: 08/30/2010 Document Revised: 07/14/2017 Document Reviewed: 04/08/2016 Elsevier Interactive Patient Education  2019 Elsevier Inc.  Vaginal Delivery, Care After Refer to this sheet in the next few weeks. These instructions provide you with information about caring for yourself after vaginal delivery. Your health care provider may also give you more specific instructions. Your treatment has been planned according to current medical practices, but problems sometimes occur. Call your health care provider if you have any problems or questions. What can I expect after the procedure? After vaginal delivery, it is common to have:  Some bleeding from your vagina.  Soreness in your abdomen, your vagina, and the area of skin between your vaginal opening and your anus (perineum).  Pelvic cramps.  Fatigue. Follow these instructions at home: Medicines  Take over-the-counter and prescription medicines only as told by your health care provider.  If you were prescribed an antibiotic medicine, take it as told by your health care provider. Do not stop taking the antibiotic until it is finished. Driving   Do not drive or operate heavy machinery while taking prescription pain medicine.  Do not drive  for 24 hours if you received a sedative. Lifestyle  Do not drink alcohol. This is especially important if you are breastfeeding or taking medicine to relieve pain.  Do not use tobacco products, including cigarettes, chewing tobacco, or e-cigarettes. If you need help quitting, ask your health care  provider. Eating and drinking  Drink at least 8 eight-ounce glasses of water every day unless you are told not to by your health care provider. If you choose to breastfeed your baby, you may need to drink more water than this.  Eat high-fiber foods every day. These foods may help prevent or relieve constipation. High-fiber foods include: ? Whole grain cereals and breads. ? Brown rice. ? Beans. ? Fresh fruits and vegetables. Activity  Return to your normal activities as told by your health care provider. Ask your health care provider what activities are safe for you.  Rest as much as possible. Try to rest or take a nap when your baby is sleeping.  Do not lift anything that is heavier than your baby or 10 lb (4.5 kg) until your health care provider says that it is safe.  Talk with your health care provider about when you can engage in sexual activity. This may depend on your: ? Risk of infection. ? Rate of healing. ? Comfort and desire to engage in sexual activity. Vaginal Care  If you have an episiotomy or a vaginal tear, check the area every day for signs of infection. Check for: ? More redness, swelling, or pain. ? More fluid or blood. ? Warmth. ? Pus or a bad smell.  Do not use tampons or douches until your health care provider says this is safe.  Watch for any blood clots that may pass from your vagina. These may look like clumps of dark red, brown, or black discharge. General instructions  Keep your perineum clean and dry as told by your health care provider.  Wear loose, comfortable clothing.  Wipe from front to back when you use the toilet.  Ask your health care provider if you can shower or take a bath. If you had an episiotomy or a perineal tear during labor and delivery, your health care provider may tell you not to take baths for a certain length of time.  Wear a bra that supports your breasts and fits you well.  If possible, have someone help you with household  activities and help care for your baby for at least a few days after you leave the hospital.  Keep all follow-up visits for you and your baby as told by your health care provider. This is important. Contact a health care provider if:  You have: ? Vaginal discharge that has a bad smell. ? Difficulty urinating. ? Pain when urinating. ? A sudden increase or decrease in the frequency of your bowel movements. ? More redness, swelling, or pain around your episiotomy or vaginal tear. ? More fluid or blood coming from your episiotomy or vaginal tear. ? Pus or a bad smell coming from your episiotomy or vaginal tear. ? A fever. ? A rash. ? Little or no interest in activities you used to enjoy. ? Questions about caring for yourself or your baby.  Your episiotomy or vaginal tear feels warm to the touch.  Your episiotomy or vaginal tear is separating or does not appear to be healing.  Your breasts are painful, hard, or turn red.  You feel unusually sad or worried.  You feel nauseous or you vomit.  You  large blood clots from your vagina. If you pass a blood clot from your vagina, save it to show to your health care provider. Do not flush blood clots down the toilet without having your health care provider look at them. °· You urinate more than usual. °· You are dizzy or light-headed. °· You have not breastfed at all and you have not had a menstrual period for 12 weeks after delivery. °· You have stopped breastfeeding and you have not had a menstrual period for 12 weeks after you stopped breastfeeding. °Get help right away if: °· You have: °? Pain that does not go away or does not get better with medicine. °? Chest pain. °? Difficulty breathing. °? Blurred vision or spots in your vision. °? Thoughts about hurting yourself or your baby. °· You develop pain in your abdomen or in one of your legs. °· You develop a severe headache. °· You faint. °· You bleed from your vagina so much that you fill two  sanitary pads in one hour. °This information is not intended to replace advice given to you by your health care provider. Make sure you discuss any questions you have with your health care provider. °Document Released: 07/25/2000 Document Revised: 01/09/2016 Document Reviewed: 08/12/2015 °Elsevier Interactive Patient Education © 2019 Elsevier Inc. ° °

## 2018-11-12 NOTE — Progress Notes (Signed)
CSW acknowledges consult and completed clinical assessment.  Clinical documentation will follow.  There are no barriers to d/c.  Lenzy Kerschner Boyd-Gilyard, MSW, LCSW Clinical Social Work [phone]   

## 2018-11-12 NOTE — Progress Notes (Signed)
B/P elevated  meds given as ordered  Will recheck in 

## 2018-11-16 ENCOUNTER — Ambulatory Visit: Payer: Self-pay

## 2018-11-16 ENCOUNTER — Telehealth: Payer: Self-pay | Admitting: *Deleted

## 2018-11-16 NOTE — Telephone Encounter (Signed)
Pt called stating she was just discharged from the hospital and wants to make a follow up appointment.

## 2018-11-16 NOTE — Lactation Note (Signed)
This note was copied from a baby's chart. Lactation Consultation Note  Patient Name: Karen Booth Today's Date: 11/16/2018   [facility] visit made to NICU Room 329. Mom was not in room, but [name], RN said she would ask Mom if she would like to speak with Lactation today.   At discharge, Mom noted to be taking labetalol 400mg  bid (L2) & sertraline 50mg  qd (L2).   Lurline Hare [facility] 11/16/2018, 8:42 AM

## 2018-11-17 ENCOUNTER — Encounter: Payer: Self-pay | Admitting: Obstetrics & Gynecology

## 2018-11-19 ENCOUNTER — Telehealth: Payer: Self-pay

## 2018-11-19 NOTE — Telephone Encounter (Signed)
Pt called in because she missed a call from our office, states she had already delivered and she needed a 1 week follow up. Had patient download WebX and  Agreed that she's okay with a virtual visit. Will need to be scheduled.

## 2018-11-22 ENCOUNTER — Ambulatory Visit: Payer: Self-pay

## 2018-11-22 ENCOUNTER — Other Ambulatory Visit: Payer: Self-pay

## 2018-11-22 ENCOUNTER — Encounter: Payer: Self-pay | Admitting: Family Medicine

## 2018-11-22 ENCOUNTER — Ambulatory Visit (INDEPENDENT_AMBULATORY_CARE_PROVIDER_SITE_OTHER): Payer: Medicaid Other | Admitting: Family Medicine

## 2018-11-22 DIAGNOSIS — O10913 Unspecified pre-existing hypertension complicating pregnancy, third trimester: Secondary | ICD-10-CM

## 2018-11-22 DIAGNOSIS — Z1389 Encounter for screening for other disorder: Secondary | ICD-10-CM

## 2018-11-22 DIAGNOSIS — R413 Other amnesia: Secondary | ICD-10-CM

## 2018-11-22 DIAGNOSIS — I6783 Posterior reversible encephalopathy syndrome: Secondary | ICD-10-CM

## 2018-11-22 LAB — POCT PREGNANCY, URINE: Preg Test, Ur: NEGATIVE

## 2018-11-22 MED ORDER — ETONOGESTREL 68 MG ~~LOC~~ IMPL
68.0000 mg | DRUG_IMPLANT | Freq: Once | SUBCUTANEOUS | Status: AC
  Administered 2018-11-22: 15:00:00 68 mg via SUBCUTANEOUS

## 2018-11-22 MED ORDER — LISINOPRIL 20 MG PO TABS: 20.0000 mg | ORAL_TABLET | Freq: Every day | ORAL | 3 refills | Status: DC

## 2018-11-22 NOTE — Progress Notes (Signed)
Subjective:     Karen Booth is a 24 y.o. female who presents for a postpartum visit. She is 19w5days postpartum following forceps delivry. I have fully reviewed the prenatal and intrapartum course. The delivery was at 36.[redacted] wksgestational weeks. Outcome: pt had PRES. Anesthesia: EPIDURAL. Postpartum course has complicated by continued blurry vision and short gterm memory loss. Baby's course has been uncomplicated. Baby is feeding by breast. Bleeding none. Bowel function is normal. Bladder function is normal. Patient  Is not sexually active. Contraception method is undecided. Postpartum depression screening: negative.  The following portions of the patient's history were reviewed and updated as appropriate: allergies, current medications, past family history, past medical history, past social history, past surgical history and problem list.  Review of Systems Pertinent items are noted in HPI.   Objective:    BP (!) 178/119   Pulse 86   Temp 99.5 F (37.5 C)   Wt 160 lb 11.2 oz (72.9 kg)   Breastfeeding Yes   BMI 30.36 kg/m   General:  alert and cooperative  Lungs: clear to auscultation bilaterally and normal percussion bilaterally  Heart:  regular rate and rhythm, S1, S2 normal, no murmur, click, rub or gallop  Abdomen: soft, non-tender; bowel sounds normal; no masses,  no organomegaly        Nexplanon Insertion:  Patient given informed consent, signed copy in the chart, time out was performed. Pregnancy test was neg. Appropriate time out taken.  Patient's left arm was prepped and draped in the usual sterile fashion.. The ruler used to measure and mark insertion area.  Pt was prepped with alcohol swab and then injected with 3 cc of 1% lidocaine with epinephrine.  Pt was prepped with betadine, Implanon removed form packaging,  Device confirmed in needle, then inserted full length of needle and withdrawn per handbook instructions.  Device palpated by physician and patient.  Pt insertion site  covered with pressure dressing.   Minimal blood loss.  Pt tolerated the procedure well.    Assessment:     Normal postpartum exam.  CHTN.  PRES.  Short Term Memory loss. Pap smear not done at today's visit.   Plan:    1. Contraception: Nexplanon 2. Start lisinopril 3. Referral to [facility] 4. Referral to neurology for PRES and short term memory loss - ? Small stroke 5. Referral to occupational therapy for short term memory loss 6. Follow up in: 2 weeks or as needed.

## 2018-11-22 NOTE — Addendum Note (Signed)
Addended by: Angelina Sheriff on: 11/22/2018 02:55 PM   Modules accepted: Orders

## 2018-11-24 ENCOUNTER — Encounter: Payer: Self-pay | Admitting: Obstetrics & Gynecology

## 2018-11-24 ENCOUNTER — Ambulatory Visit (HOSPITAL_COMMUNITY): Payer: Medicaid Other

## 2018-11-24 ENCOUNTER — Encounter (HOSPITAL_COMMUNITY): Payer: Self-pay

## 2018-11-29 ENCOUNTER — Telehealth: Payer: Self-pay | Admitting: Family Medicine

## 2018-11-29 NOTE — Telephone Encounter (Signed)
Called pt and discussed her concern. She stated that she has a stitch from her episiotomy which is hanging and getting caught on her underwear and also when wiping after urination. She requests office appt. Pt was offered appointment on 4/22 @ 0955 and she accepted.

## 2018-11-30 ENCOUNTER — Telehealth: Payer: Self-pay | Admitting: Student

## 2018-11-30 ENCOUNTER — Telehealth: Payer: Self-pay | Admitting: Neurology

## 2018-11-30 NOTE — Telephone Encounter (Signed)
Left 2 messages for patient regarding virtual office visit - to administer MoCA (testing).

## 2018-11-30 NOTE — Telephone Encounter (Signed)
Called the patient and left a detailed voicemail message concerning the upcoming appointment for tomorrow.

## 2018-12-01 ENCOUNTER — Encounter: Payer: Self-pay | Admitting: Obstetrics and Gynecology

## 2018-12-01 ENCOUNTER — Other Ambulatory Visit: Payer: Self-pay

## 2018-12-01 ENCOUNTER — Encounter (HOSPITAL_COMMUNITY): Payer: Self-pay

## 2018-12-01 ENCOUNTER — Inpatient Hospital Stay (HOSPITAL_COMMUNITY)
Admission: AD | Admit: 2018-12-01 | Discharge: 2018-12-01 | Disposition: A | Discharge status: Home / Self Care | Payer: Medicaid Other | Attending: Obstetrics and Gynecology | Admitting: Obstetrics and Gynecology

## 2018-12-01 ENCOUNTER — Ambulatory Visit (INDEPENDENT_AMBULATORY_CARE_PROVIDER_SITE_OTHER): Payer: Medicaid Other | Admitting: Obstetrics and Gynecology

## 2018-12-01 VITALS — BP 147/111 | HR 70 | Temp 98.9°F | Ht 61.0 in | Wt 158.4 lb

## 2018-12-01 DIAGNOSIS — Z833 Family history of diabetes mellitus: Secondary | ICD-10-CM | POA: Insufficient documentation

## 2018-12-01 DIAGNOSIS — O99345 Other mental disorders complicating the puerperium: Secondary | ICD-10-CM | POA: Insufficient documentation

## 2018-12-01 DIAGNOSIS — Z87891 Personal history of nicotine dependence: Secondary | ICD-10-CM | POA: Insufficient documentation

## 2018-12-01 DIAGNOSIS — Z803 Family history of malignant neoplasm of breast: Secondary | ICD-10-CM | POA: Diagnosis not present

## 2018-12-01 DIAGNOSIS — O10913 Unspecified pre-existing hypertension complicating pregnancy, third trimester: Secondary | ICD-10-CM

## 2018-12-01 DIAGNOSIS — Z8249 Family history of ischemic heart disease and other diseases of the circulatory system: Secondary | ICD-10-CM | POA: Diagnosis not present

## 2018-12-01 DIAGNOSIS — Z79899 Other long term (current) drug therapy: Secondary | ICD-10-CM | POA: Insufficient documentation

## 2018-12-01 DIAGNOSIS — O165 Unspecified maternal hypertension, complicating the puerperium: Secondary | ICD-10-CM

## 2018-12-01 DIAGNOSIS — F419 Anxiety disorder, unspecified: Secondary | ICD-10-CM | POA: Diagnosis not present

## 2018-12-01 DIAGNOSIS — Z886 Allergy status to analgesic agent status: Secondary | ICD-10-CM | POA: Insufficient documentation

## 2018-12-01 DIAGNOSIS — Z800 Family history of malignant neoplasm of digestive organs: Secondary | ICD-10-CM | POA: Diagnosis not present

## 2018-12-01 DIAGNOSIS — R011 Cardiac murmur, unspecified: Secondary | ICD-10-CM | POA: Diagnosis not present

## 2018-12-01 DIAGNOSIS — I10 Essential (primary) hypertension: Secondary | ICD-10-CM

## 2018-12-01 LAB — COMPREHENSIVE METABOLIC PANEL
ALT: 19 U/L (ref 0–44)
AST: 18 U/L (ref 15–41)
Albumin: 3.6 g/dL (ref 3.5–5.0)
Alkaline Phosphatase: 62 U/L (ref 38–126)
Anion gap: 9 (ref 5–15)
BUN: 12 mg/dL (ref 6–20)
CO2: 24 mmol/L (ref 22–32)
Calcium: 9 mg/dL (ref 8.9–10.3)
Chloride: 109 mmol/L (ref 98–111)
Creatinine, Ser: 1.03 mg/dL — ABNORMAL HIGH (ref 0.44–1.00)
GFR calc Af Amer: >60 mL/min (ref >60)
GFR calc non Af Amer: >60 mL/min (ref >60)
Glucose, Bld: 82 mg/dL (ref 70–99)
Potassium: 3.6 mmol/L (ref 3.5–5.1)
Sodium: 142 mmol/L (ref 135–145)
Total Bilirubin: 0.7 mg/dL (ref 0.3–1.2)
Total Protein: 6.2 g/dL — ABNORMAL LOW (ref 6.5–8.1)

## 2018-12-01 LAB — URINALYSIS, ROUTINE W REFLEX MICROSCOPIC
Bilirubin Urine: NEGATIVE
Glucose, UA: NEGATIVE mg/dL
Ketones, ur: NEGATIVE mg/dL
Nitrite: NEGATIVE
Protein, ur: NEGATIVE mg/dL
Specific Gravity, Urine: 1.015 (ref 1.005–1.030)
pH: 6 (ref 5.0–8.0)

## 2018-12-01 LAB — CBC
HCT: 32.6 % — ABNORMAL LOW (ref 36.0–46.0)
Hemoglobin: 10.5 g/dL — ABNORMAL LOW (ref 12.0–15.0)
MCH: 26.2 pg (ref 26.0–34.0)
MCHC: 32.2 g/dL (ref 30.0–36.0)
MCV: 81.3 fL (ref 80.0–100.0)
Platelets: 240 10*3/uL (ref 150–400)
RBC: 4.01 MIL/uL (ref 3.87–5.11)
RDW: 15.3 % (ref 11.5–15.5)
WBC: 5.1 10*3/uL (ref 4.0–10.5)
nRBC: 0 % (ref 0.0–0.2)

## 2018-12-01 LAB — PROTEIN / CREATININE RATIO, URINE
Creatinine, Urine: 133.43 mg/dL
Protein Creatinine Ratio: 0.12 mg/mg{Cre} (ref 0.00–0.15)
Total Protein, Urine: 16 mg/dL

## 2018-12-01 MED ORDER — LABETALOL HCL 100 MG PO TABS
400.0000 mg | ORAL_TABLET | Freq: Once | ORAL | Status: AC
  Administered 2018-12-01: 400 mg via ORAL
  Filled 2018-12-01: qty 4

## 2018-12-01 NOTE — MAU Provider Note (Signed)
Chief Complaint: Hypertension   First Provider Initiated Contact with Patient 12/01/18 1257     SUBJECTIVE HPI: Karen Booth is a 23 y.o. G2P1101 at 3 weeks postpartum who presents to [facility] for hypertension evaluation.  Had IOL d/t PRES & HELLP. Pt with chronic hypertension. Her BP meds should be labetalol 400 mg BID & lisinopril 20 mg QD. Hasn't taken labetalol since last week b/c she "heard it could affect her breast milk production".  Was in office today for BP check. Had severe range BP but hadn't taken her lisinopril yet. Took her lisinopril prior to coming to MAU.  Denies headache or epigastric pain. Continues to have some blurred vision which has been a continued issue since she was here to have her baby, although it's not as bad as when she was here before & she is in the process of being referred to neurology.   Past Medical History:  Diagnosis Date  . Anxiety    never discussed or been treated, 'always had it, been coming up lately"  . Chlamydia   . Heart murmur   . Hypertension   . Infection    UTI  . Urinary tract infection    OB History  Gravida Para Term Preterm AB Living  SAB TAB Ectopic Multiple Live Births          1    # Outcome Date GA Lbr Len/2nd Weight Sex Delivery Anes PTL Lv  2 Term 04/07/14 [redacted]w[redacted]d 09:02 / 06:15 3011 g M Vag-Spont EPI  LIV  1 Preterm      Vag-Spont      Past Surgical History:  Procedure Laterality Date  . TONSILLECTOMY     Social History   Socioeconomic History  . Marital status: Single    Spouse name: Not on file  . Number of children: Not on file  . Years of education: Not on file  . Highest education level: Not on file  Occupational History  . Not on file  Social Needs  . Financial resource strain: Not on file  . Food insecurity:    Worry: Not on file    Inability: Not on file  . Transportation needs:    Medical: Not on file    Non-medical: Not on file  Tobacco Use  . Smoking status: Former  Smoker    Types: Cigarettes  . Smokeless tobacco: Never Used  . Tobacco comment: quit  before preg  Substance and Sexual Activity  . Alcohol use: No  . Drug use: Not Currently    Types: Marijuana    Comment: last use 30 May 2018  . Sexual activity: Yes    Birth control/protection: None  Lifestyle  . Physical activity:    Days per week: Not on file    Minutes per session: Not on file  . Stress: Not on file  Relationships  . Social connections:    Talks on phone: Not on file    Gets together: Not on file    Attends religious service: Not on file    Active member of club or organization: Not on file    Attends meetings of clubs or organizations: Not on file    Relationship status: Not on file  . Intimate partner violence:    Fear of current or ex partner: Not on file    Emotionally abused: Not on file    Physically abused: Not on file    Forced sexual activity:  Not on file  Other Topics Concern  . Not on file  Social History Narrative  . Not on file   Family History  Problem Relation Age of Onset  . Heart disease Mother        murmur, arrythmia  . Hypertension Mother   . Heart murmur Mother   . Arrhythmia Mother   . Hypertension Father   . Breast cancer Maternal Grandmother        and g-gma  . Breast cancer Paternal Grandmother   . Esophageal cancer Maternal Uncle   . Diabetes Paternal Aunt   . Stomach cancer Maternal Uncle   . Hearing loss Neg Hx    No current facility-administered medications on file prior to encounter.    Current Outpatient Medications on File Prior to Encounter  Medication Sig Dispense Refill  . etonogestrel (NEXPLANON) 68 MG IMPL implant 1 each by Subdermal route once. Placed 11/2018    . labetalol (NORMODYNE) 200 MG tablet Take 2 tablets (400 mg total) by mouth 2 (two) times daily. 100 tablet 1  . lisinopril (PRINIVIL,ZESTRIL) 20 MG tablet Take 1 tablet (20 mg total) by mouth daily. 30 tablet 3  . Prenatal Vit-Fe Fumarate-FA (PRENATAL  MULTIVITAMIN) TABS tablet Take 1 tablet by mouth daily at 12 noon.    . sertraline (ZOLOFT) 50 MG tablet Take 1 tablet (50 mg total) by mouth daily. 30 tablet 1   Allergies  Allergen Reactions  . Ibuprofen Anaphylaxis, Hives and Swelling    Oral swelling    I have reviewed patient's Past Medical Hx, Surgical Hx, Family Hx, Social Hx, medications and allergies.   Review of Systems  Constitutional: Negative.   Eyes: Positive for visual disturbance.  Gastrointestinal: Negative.   Genitourinary: Negative.   Neurological: Negative for headaches.    OBJECTIVE Patient Vitals for the past 24 hrs:  BP Temp Temp src Pulse Resp SpO2 Height Weight  12/01/18 1505 (!) 151/102 - - 70 - - - -  12/01/18 1436 (!) 157/117 - - 76 - - - -  12/01/18 1421 (!) 165/103 - - 64 - - - -  12/01/18 1300 (!) 146/99 - - 70 - - - -  12/01/18 1245 (!) 149/96 - - 68 - - - -  12/01/18 1230 (!) 153/92 - - 78 - - - -  12/01/18 1224 (!) 148/80 99.2 F (37.3 C) Oral 81 18 100 % - -  12/01/18 1207 - - - - - - 5\' 1"  (1.549 m) 72.5 kg   Constitutional: Well-developed, well-nourished female in no acute distress.  Cardiovascular: normal rate & rhythm, no murmur Respiratory: normal rate and effort. Lung sounds clear throughout GI: Abd soft, non-tender, Pos BS x 4. No guarding or rebound tenderness MS: Extremities nontender, no edema, normal ROM Neurologic: Alert and oriented x 4.     LAB RESULTS Results for orders placed or performed during the hospital encounter of 12/01/18 (from the past 24 hour(s))  Urinalysis, Routine w reflex microscopic     Status: Abnormal   Collection Time: 12/01/18 12:41 PM  Result Value Ref Range   Color, Urine YELLOW YELLOW   APPearance CLEAR CLEAR   Specific Gravity, Urine 1.015 1.005 - 1.030   pH 6.0 5.0 - 8.0   Glucose, UA NEGATIVE NEGATIVE mg/dL   Hgb urine dipstick LARGE (A) NEGATIVE   Bilirubin Urine NEGATIVE NEGATIVE   Ketones, ur NEGATIVE NEGATIVE mg/dL   Protein, ur  NEGATIVE NEGATIVE mg/dL   Nitrite NEGATIVE NEGATIVE  Leukocytes,Ua LARGE (A) NEGATIVE   RBC / HPF 0-5 0 - 5 RBC/hpf   WBC, UA 21-50 0 - 5 WBC/hpf   Bacteria, UA RARE (A) NONE SEEN   Squamous Epithelial / LPF 6-10 0 - 5   Mucus PRESENT   Protein / creatinine ratio, urine     Status: None   Collection Time: 12/01/18 12:41 PM  Result Value Ref Range   Creatinine, Urine 133.43 mg/dL   Total Protein, Urine 16 mg/dL   Protein Creatinine Ratio 0.12 0.00 - 0.15 mg/mg[Cre]  CBC     Status: Abnormal   Collection Time: 12/01/18  1:14 PM  Result Value Ref Range   WBC 5.1 4.0 - 10.5 K/uL   RBC 4.01 3.87 - 5.11 MIL/uL   Hemoglobin 10.5 (L) 12.0 - 15.0 g/dL   HCT 97.0 (L) 26.3 - 78.5 %   MCV 81.3 80.0 - 100.0 fL   MCH 26.2 26.0 - 34.0 pg   MCHC 32.2 30.0 - 36.0 g/dL   RDW 88.5 02.7 - 74.1 %   Platelets 240 150 - 400 K/uL   nRBC 0.0 0.0 - 0.2 %  Comprehensive metabolic panel     Status: Abnormal   Collection Time: 12/01/18  1:14 PM  Result Value Ref Range   Sodium 142 135 - 145 mmol/L   Potassium 3.6 3.5 - 5.1 mmol/L   Chloride 109 98 - 111 mmol/L   CO2 24 22 - 32 mmol/L   Glucose, Bld 82 70 - 99 mg/dL   BUN 12 6 - 20 mg/dL   Creatinine, Ser 2.87 (H) 0.44 - 1.00 mg/dL   Calcium 9.0 8.9 - 86.7 mg/dL   Total Protein 6.2 (L) 6.5 - 8.1 g/dL   Albumin 3.6 3.5 - 5.0 g/dL   AST 18 15 - 41 U/L   ALT 19 0 - 44 U/L   Alkaline Phosphatase 62 38 - 126 U/L   Total Bilirubin 0.7 0.3 - 1.2 mg/dL   GFR calc non Af Amer >60 >60 mL/min   GFR calc Af Amer >60 >60 mL/min   Anion gap 9 5 - 15    IMAGING No results found.  MAU COURSE Orders Placed This Encounter  Procedures  . Urinalysis, Routine w reflex microscopic  . CBC  . Comprehensive metabolic panel  . Protein / creatinine ratio, urine  . Discharge patient   Meds ordered this encounter  Medications  . labetalol (NORMODYNE) tablet 400 mg    MDM PEC labs done. Platelets improved from admission & pt no longer with proteinuria. Serum  creatinine elevated at 1.03.  Pt with 1 severe range BP while in MAU. Had discussion with patient regarding oral labetalol during breastfeeding. She is agreeable to restarting it. Labetalol 400 mg PO given in MAU.  Reviewed patient vital signs, presentation & labs with Dr. Earlene Plater. Ok to discharge home. Needs f/u BP check in a week, can be a televisit.   ASSESSMENT 1. Chronic hypertension   2. Postpartum hypertension   3. Chronic hypertension in third trimester     PLAN Discharge home in stable condition. PP PEC precautions Restart labetalol & continue to take lisinopril Msg send to [facility] for BP check appt  Allergies as of 12/01/2018      Reactions   Ibuprofen Anaphylaxis, Hives, Swelling   Oral swelling      Medication List    TAKE these medications   labetalol 200 MG tablet Commonly known as:  NORMODYNE Take 2 tablets (400 mg  total) by mouth 2 (two) times daily.   lisinopril 20 MG tablet Commonly known as:  ZESTRIL Take 1 tablet (20 mg total) by mouth daily.   Nexplanon 68 MG Impl implant Generic drug:  etonogestrel 1 each by Subdermal route once. Placed 11/2018   prenatal multivitamin Tabs tablet Take 1 tablet by mouth daily at 12 noon.   sertraline 50 MG tablet Commonly known as:  ZOLOFT Take 1 tablet (50 mg total) by mouth daily.        Judeth Horn, NP 12/01/2018  3:47 PM

## 2018-12-01 NOTE — Discharge Instructions (Signed)
Postpartum Hypertension  Postpartum hypertension is high blood pressure that remains higher than normal after childbirth. You may not realize that you have postpartum hypertension if your blood pressure is not being checked regularly. In most cases, postpartum hypertension will go away on its own, usually within a week of delivery. However, for some women, medical treatment is required to prevent serious complications, such as seizures or stroke.  What are the causes?  This condition may be caused by one or more of the following:   Hypertension that existed before pregnancy (chronic hypertension).   Hypertension that comes on as a result of pregnancy (gestational hypertension).   Hypertensive disorders during pregnancy (preeclampsia) or seizures in women who have high blood pressure during pregnancy (eclampsia).   A condition in which the liver, platelets, and red blood cells are damaged during pregnancy (HELLP syndrome).   A condition in which the thyroid produces too much hormones (hyperthyroidism).   Other rare problems of the nerves (neurological disorders) or blood disorders.  In some cases, the cause may not be known.  What increases the risk?  The following factors may make you more likely to develop this condition:   Chronic hypertension. In some cases, this may not have been diagnosed before pregnancy.   Obesity.   Type 2 diabetes.   Kidney disease.   History of preeclampsia or eclampsia.   Other medical conditions that change the level of hormones in the body (hormonal imbalance).  What are the signs or symptoms?  As with all types of hypertension, postpartum hypertension may not have any symptoms. Depending on how high your blood pressure is, you may experience:   Headaches. These may be mild, moderate, or severe. They may also be steady, constant, or sudden in onset (thunderclap headache).   Changes in your ability to see (visual changes).   Dizziness.   Shortness of breath.   Swelling  of your hands, feet, lower legs, or face. In some cases, you may have swelling in more than one of these locations.   Heart palpitations or a racing heartbeat.   Difficulty breathing while lying down.   Decrease in the amount of urine that you pass.  Other rare signs and symptoms may include:   Sweating more than usual. This lasts longer than a few days after delivery.   Chest pain.   Sudden dizziness when you get up from sitting or lying down.   Seizures.   Nausea or vomiting.   Abdominal pain.  How is this diagnosed?  This condition may be diagnosed based on the results of a physical exam, blood pressure measurements, and blood and urine tests.  You may also have other tests, such as a CT scan or an MRI, to check for other problems of postpartum hypertension.  How is this treated?  If blood pressure is high enough to require treatment, your options may include:   Medicines to reduce blood pressure (antihypertensives). Tell your health care provider if you are breastfeeding or if you plan to breastfeed. There are many antihypertensive medicines that are safe to take while breastfeeding.   Stopping medicines that may be causing hypertension.   Treating medical conditions that are causing hypertension.   Treating the complications of hypertension, such as seizures, stroke, or kidney problems.  Your health care provider will also continue to monitor your blood pressure closely until it is within a safe range for you.  Follow these instructions at home:   Take over-the-counter and prescription medicines only as   told by your health care provider.   Return to your normal activities as told by your health care provider. Ask your health care provider what activities are safe for you.   Do not use any products that contain nicotine or tobacco, such as cigarettes and e-cigarettes. If you need help quitting, ask your health care provider.   Keep all follow-up visits as told by your health care provider. This  is important.  Contact a health care provider if:   Your symptoms get worse.   You have new symptoms, such as:  ? A headache that does not get better.  ? Dizziness.  ? Visual changes.  Get help right away if:   You suddenly develop swelling in your hands, ankles, or face.   You have sudden, rapid weight gain.   You develop difficulty breathing, chest pain, racing heartbeat, or heart palpitations.   You develop severe pain in your abdomen.   You have any symptoms of a stroke. "BE FAST" is an easy way to remember the main warning signs of a stroke:  ? B - Balance. Signs are dizziness, sudden trouble walking, or loss of balance.  ? E - Eyes. Signs are trouble seeing or a sudden change in vision.  ? F - Face. Signs are sudden weakness or numbness of the face, or the face or eyelid drooping on one side.  ? A - Arms. Signs are weakness or numbness in an arm. This happens suddenly and usually on one side of the body.  ? S - Speech. Signs are sudden trouble speaking, slurred speech, or trouble understanding what people say.  ? T - Time. Time to call emergency services. Write down what time symptoms started.   You have other signs of a stroke, such as:  ? A sudden, severe headache with no known cause.  ? Nausea or vomiting.  ? Seizure.  These symptoms may represent a serious problem that is an emergency. Do not wait to see if the symptoms will go away. Get medical help right away. Call your local emergency services (911 in the [place] Do not drive yourself to the hospital.  Summary   Postpartum hypertension is high blood pressure that remains higher than normal after childbirth.   In most cases, postpartum hypertension will go away on its own, usually within a week of delivery.   For some women, medical treatment is required to prevent serious complications, such as seizures or stroke.  This information is not intended to replace advice given to you by your health care provider. Make sure you discuss any questions  you have with your health care provider.  Document Released: 03/31/2014 Document Revised: 05/18/2017 Document Reviewed: 05/18/2017  Elsevier Interactive Patient Education  2019 [employer]

## 2018-12-01 NOTE — Progress Notes (Signed)
Obstetrics and Gynecology Visit PP BP Check Patient Evaluation  Appointment Date: 12/01/2018  Primary Care Provider: Patient, No Pcp Per  OBGYN Clinic: [facility]  Chief Complaint: BP check  History of Present Illness:  Karen Booth is a 23 y.o. G2P2 s/p 4/1 low FAVD/2nd degree. Pt with PRES, atypicall HELLP superimposed on cHTN.  Patient seen on 4/13 for BP check and put on lisinopril and nexplanon inserted  Interval History: Since that time, she states that she has no HA, chest pain, sob. Pt does have some blurry vision but this is unchanged from what it was in the hospital.  Pt didn't take her lisinopril this morning with last dose yesterday and she has taken her labetalol in about a week or so.   Pt has suture hanging out and would like that looked at. Baby is doing well.   Review of Systems: as noted in the History of Present Illness.   Patient Active Problem List   Diagnosis Date Noted  . MDD (major depressive disorder), single episode, severe , no psychosis (HCC)   . PRES (posterior reversible encephalopathy syndrome) 11/09/2018  . Chronic hypertension in third trimester 10/19/2018  . Obesity in pregnancy 05/21/2018   Medications:  Tora Perches had no medications administered during this visit. Current Outpatient Medications  Medication Sig Dispense Refill  . lisinopril (PRINIVIL,ZESTRIL) 20 MG tablet Take 1 tablet (20 mg total) by mouth daily. 30 tablet 3  . Prenatal Vit-Fe Fumarate-FA (PRENATAL MULTIVITAMIN) TABS tablet Take 1 tablet by mouth daily at 12 noon.    . sertraline (ZOLOFT) 50 MG tablet Take 1 tablet (50 mg total) by mouth daily. 30 tablet 1  . labetalol (NORMODYNE) 200 MG tablet Take 2 tablets (400 mg total) by mouth 2 (two) times daily. (Patient not taking: Reported on 11/22/2018) 100 tablet 1   No current facility-administered medications for this visit.     Allergies: is allergic to ibuprofen.  Physical Exam:  BP (!) 147/111 (BP  Location: Left Arm)   Pulse 70   Temp 98.9 F (37.2 C)   Ht 5\' 1"  (1.549 m)   Wt 158 lb 6.4 oz (71.8 kg)   Breastfeeding Yes   BMI 29.93 kg/m  Body mass index is 29.93 kg/m. 10 minute repeat 160/110 General appearance: Well nourished, well developed female in no acute distress.  Abdomen: diffusely non tender to palpation, non distended, and no masses, hernias RRR, no MRGs CTAB Neuro/Psych:  Normal mood and affect.    Pelvic exam:  4cm vicryl suture coming out of the vagina and was trimmed to introitus.   Assessment: pt stable  Plan: Pt lives nearby and was asked to go home and take lisinopril and then go to MAU for pre-eclampsia evaluation. Hopefully if labs are fine and her ROS is still negative then can go home once her BPs are able to come down.  RTC: 1wk bp check  Cornelia Copa MD Attending [facility])

## 2018-12-01 NOTE — MAU Note (Signed)
Karen Booth is a 23 y.o. here in MAU reporting: sent in from clinic for hypertension, had a vaginal delivery April 1st. Was induced for pre-eclampsia. Reports no headache or epigastric pain. States when she was here having the baby she had blurry vision and it never went away. Is on lisinopril and labetalol, has only been taking lisinopril at home.  Pain score: 0/10  Vitals:   12/01/18 1224  BP: (!) 148/80  Pulse: 81  Resp: 18  Temp: 99.2 F (37.3 C)  SpO2: 100%      Lab orders placed from triage: UA

## 2018-12-06 ENCOUNTER — Telehealth: Payer: Self-pay | Admitting: Neurology

## 2018-12-06 ENCOUNTER — Encounter: Payer: Self-pay | Admitting: General Surgery

## 2018-12-06 ENCOUNTER — Telehealth: Payer: Self-pay | Admitting: *Deleted

## 2018-12-06 ENCOUNTER — Telehealth: Payer: Self-pay | Admitting: Advanced Practice Midwife

## 2018-12-06 ENCOUNTER — Ambulatory Visit: Payer: Self-pay | Admitting: Family Medicine

## 2018-12-06 NOTE — Telephone Encounter (Signed)
I called Karen Booth re: her virtual visit tomorrow. I reviewed the appointment time with her. She has already downloaded [employer] app and I discussed how the visit will go tomorrow. She voices understanding.

## 2018-12-06 NOTE — Telephone Encounter (Signed)
Made another attempt to try and do a MoCA. Left msg.

## 2018-12-06 NOTE — Telephone Encounter (Signed)
Left the patient a detailed voicemail message regarding upcoming visit.

## 2018-12-07 ENCOUNTER — Ambulatory Visit (INDEPENDENT_AMBULATORY_CARE_PROVIDER_SITE_OTHER): Payer: Medicaid Other | Admitting: Student

## 2018-12-07 ENCOUNTER — Other Ambulatory Visit: Payer: Self-pay

## 2018-12-07 ENCOUNTER — Telehealth (INDEPENDENT_AMBULATORY_CARE_PROVIDER_SITE_OTHER): Payer: Medicaid Other | Admitting: Neurology

## 2018-12-07 ENCOUNTER — Encounter: Payer: Self-pay | Admitting: Neurology

## 2018-12-07 ENCOUNTER — Encounter: Payer: Self-pay | Admitting: Student

## 2018-12-07 VITALS — BP 160/112

## 2018-12-07 VITALS — BP 157/115

## 2018-12-07 DIAGNOSIS — I6783 Posterior reversible encephalopathy syndrome: Secondary | ICD-10-CM

## 2018-12-07 DIAGNOSIS — O165 Unspecified maternal hypertension, complicating the puerperium: Secondary | ICD-10-CM

## 2018-12-07 DIAGNOSIS — I10 Essential (primary) hypertension: Secondary | ICD-10-CM

## 2018-12-07 MED ORDER — LISINOPRIL 20 MG PO TABS: 20.0000 mg | ORAL_TABLET | Freq: Two times a day (BID) | ORAL | 0 refills | Status: DC

## 2018-12-07 MED ORDER — LABETALOL HCL 300 MG PO TABS: 600.0000 mg | ORAL_TABLET | Freq: Two times a day (BID) | ORAL | 1 refills | Status: DC

## 2018-12-07 NOTE — Progress Notes (Addendum)
Virtual Visit via Video Note The purpose of this virtual visit is to provide medical care while limiting exposure to the novel coronavirus.    Consent was obtained for video visit:  Yes.   Answered questions that patient had about telehealth interaction:  Yes.   I discussed the limitations, risks, security and privacy concerns of performing an evaluation and management service by telemedicine. I also discussed with the patient that there may be a patient responsible charge related to this service. The patient expressed understanding and agreed to proceed.  Pt location: Home Physician Location: office Name of referring provider:  Levie HeritageStinson, Jacob J, DO I connected with Tora PerchesZhana Pedraza at patients initiation/request on 12/07/2018 at  1:00 PM EDT by video enabled telemedicine application and verified that I am speaking with the correct person using two identifiers. Pt MRN:  [id number] Pt DOB:  05/28/1996 Video Participants:  Tora PerchesZhana Maudlin   History of Present Illness:  This is a very pleasant 23 year old right-handed woman with a history of anxiety, in her usual state of health until 11/09/2018 at 36 weeks of gestation when she started having severe headache, confusion, and visual scotoma. She was hypertension with initial SBP >200, treated as pre-eclampsia. Speech was reported to be slurred. She was also reported to have brief staring spells where she was not responding verbally. She had an MRI brain without contrast on 11/09/2018 which I personally reviewed showing severe PRES with predominant involvement of the right greater than left parieto-occipital cortex and white matter, as well as bilateral basal ganglia. There was also note of atypical concerning hyperintense DWI signal in the right parasagittal parietal occipital cortex needing continued surveillance to the possibility of acute infarct. MRA/MRV normal. EEG normal. She delivered a healthy baby girl, Ladona Ridgelaylor, and has been home for 3 weeks. She continues  to deal with hypertension, BP medication adjusted today. She denies any further headaches and states her speech is 100% better. She denies any further staring/unresponsive episodes or gaps in time. No olfactory/gustatory hallucinations, myoclonic jerks. Her main concern is forgetfulness, she forgets what she is saying when she talks. She can think of something then just forgets it. She forgets her medications. She was previously not taking any prescription medications. She denies getting lost driving. She has been getting 5 hours of unrefreshing sleep, needing naps during the day. She denies any dizziness, vision loss or scotomas, nausea/vomiting, focal numbness/tingling/weakness, neck/back pain. Vision is still a little blurred. There is no family history of seizures. Her maternal grandmother has Alzheimer's disease. No history of concussions or alcohol use.   Active Ambulatory Problems    Diagnosis Date Noted   MDD (major depressive disorder), single episode, severe , no psychosis (HCC)    Postpartum hypertension 12/01/2018   Chronic hypertension 12/07/2018   Family History  Problem Relation Age of Onset   Heart disease Mother        murmur, arrythmia   Hypertension Mother    Heart murmur Mother    Arrhythmia Mother    Hypertension Father    Breast cancer Maternal Grandmother        and g-gma   Breast cancer Paternal Grandmother    Esophageal cancer Maternal Uncle    Diabetes Paternal Aunt    Stomach cancer Maternal Uncle    Hearing loss Neg Hx    Allergies  Allergen Reactions   Ibuprofen Anaphylaxis, Hives and Swelling    Oral swelling   Outpatient Encounter Medications as of 12/07/2018  Medication Sig  etonogestrel (NEXPLANON) 68 MG IMPL implant 1 each by Subdermal route once. Placed 11/2018   Prenatal Vit-Fe Fumarate-FA (PRENATAL MULTIVITAMIN) TABS tablet Take 1 tablet by mouth daily at 12 noon.   sertraline (ZOLOFT) 50 MG tablet Take 1 tablet (50 mg total) by  mouth daily.   [DISCONTINUED] labetalol (NORMODYNE) 200 MG tablet Take 2 tablets (400 mg total) by mouth 2 (two) times daily.   [DISCONTINUED] lisinopril (PRINIVIL,ZESTRIL) 20 MG tablet Take 1 tablet (20 mg total) by mouth daily.   No facility-administered encounter medications on file as of 12/07/2018.      Observations/Objective:   Vitals:   12/07/18 1233  BP: (!) 157/115   Patient is awake, alert, oriented x 3. No aphasia or dysarthria. Intact fluency and comprehension. Remote and recent memory intact. Able to name and repeat. Cranial nerves: Her mother performed visual field testing as instructed with no gross deficits noted. Extraocular movements intact with no nystagmus. No facial asymmetry. Motor: moves all extremities symmetrically. No incoordination on finger to nose testing. Gait: narrow-based and steady, able to tandem walk adequately. Negative Romberg test.  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 MRI brain in March 2020 showed severe PRES with predominant involvement of the right greater than left parieto-occipital cortex and white matter, as well as bilateral basal ganglia. There was also note of atypical concerning hyperintense DWI signal in the right parasagittal parietal occipital cortex needing continued surveillance to the possibility of acute infarct. MRA/MRV normal. She delivered a healthy baby girl and is now close to baseline  neurologically except for mild cognitive changes. MOCA score today normal 20/22. We discussed cognitive changes that can occur with bilateral basal ganglia changes. We discussed prognosis of PRES, abnormalities typically resolve over time, however there was atypical signal in the right parasagittal parietal occipital cortex concerning for acute infarct, which will be followed with interval imaging in June 2020. We discussed the importance of better control of BP, BP today 157/115, she will be picking up new BP medication prescription today. We discussed the importance of control of vascular risk factors, physical exercise, and brain stimulation exercises for brain health. She will follow-up in 3 months after her MRI brain but knows to call for any changes in the interim.    Follow Up Instructions:   -I discussed the assessment and treatment plan with the patient. The patient was provided an opportunity to ask questions and all were answered. The patient agreed with the plan and demonstrated an understanding of the instructions.   The patient was advised to call back or seek an in-person evaluation if the symptoms worsen or if the condition fails to improve as anticipated.    Van Clines, MD

## 2018-12-07 NOTE — Progress Notes (Addendum)
TELEHEALTH VIRTUAL POSTPARTUM VISIT ENCOUNTER NOTE  I connected with@ on 12/07/18 at 10:55 AM EDT by telephone at home and verified that I am speaking with the correct person using two identifiers.   I discussed the limitations, risks, security and privacy concerns of performing an evaluation and management service by telephone and the availability of in person appointments. I also discussed with the patient that there may be a patient responsible charge related to this service. The patient expressed understanding and agreed to proceed.  Appointment Date: 12/07/2018  [facility]  Chief Complaint:  Chief Complaint  Patient presents with  . Postpartum Care    History of Present Illness: Karen Booth is a 23 y.o. African-American G2P1101 (No LMP recorded.), seen for the above chief complaint. Her past medical history is significant for chronic hypertension; PRES, pre-eclampsia and eclampsia, pre-term delivery due to SIPE with severe features,  She is s/p forceps assisted  vaginal delivery on 4/1 at 36 weeks; she was discharged to home on 4/3 (Day # 3). . Pregnancy complicated by see above list. Baby is doing well.  Complains of nothing. Has been checking her blood pressure at home.  She took her BP medicines today (400 labetalol and 20 mg of lisinopril this morning). She currently takes 400 labetalol BID and 20 mg of lisinopril Daily.   Vaginal bleeding or discharge: tapering off Mode of feeding infant: Bottle and Breast Intercourse: No  Contraception: nexplanon PP depression s/s: No .  Any bowel or bladder issues: No  Pap smear: no abnormalities on Oct 2019  Review of Systems: Positive for nothing. Her 12 point review of systems is negative or as noted in the History of Present Illness. She denies headaches, vision changes, RUQ pain. She has some continued blurry vision and has neurology appt tomorrow and PC visit on 5-11. Patient Active Problem List   Diagnosis Date Noted  .  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 MDD (major depressive disorder), single episode, severe , no psychosis (HCC)   . PRES (posterior reversible encephalopathy syndrome) 11/09/2018  . Chronic hypertension in third trimester 10/19/2018  . Obesity in pregnancy 05/21/2018    Medications Lyanne Rana SnareLowe had no medications administered during this visit. Current Outpatient Medications  Medication Sig Dispense Refill  . etonogestrel (NEXPLANON) 68 MG IMPL implant 1 each by Subdermal route once. Placed 11/2018    . Prenatal Vit-Fe Fumarate-FA (PRENATAL MULTIVITAMIN) TABS tablet Take 1 tablet by mouth daily at 12 noon.    . sertraline (ZOLOFT) 50 MG tablet Take 1 tablet (50 mg total) by mouth daily. 30 tablet 1  . labetalol (NORMODYNE) 300 MG tablet Take 2 tablets (600 mg total) by mouth 2 (two) times daily. 90 tablet 1  . lisinopril (ZESTRIL) 20 MG tablet Take 1 tablet (20 mg total) by mouth 2 (two) times a day. 90 tablet 0   No current facility-administered medications for this visit.     Allergies Ibuprofen  Physical Exam:  General:  Alert, oriented and cooperative.   Mental Status: Normal mood and affect perceived. Normal judgment and thought content.  Rest of physical exam deferred due to type of encounter  PP Depression Screening:   Edinburgh Postnatal Depression Scale - 12/07/18 1043      Edinburgh Postnatal Depression Scale:  In the Past 7 Days   I have been able to laugh and see the funny side of things.  0    I have looked forward with enjoyment to things.  0  I have blamed myself unnecessarily when things went wrong.  0    I have been anxious or worried for no good reason.  0    I have felt scared or panicky for no good reason.  0    Things have been getting on top of me.  0    I have been so unhappy that I have had difficulty sleeping.  0    I have felt sad or miserable.  0    I have been so unhappy that I have been crying.  0    The thought of harming myself has occurred to  me.  0    Edinburgh Postnatal Depression Scale Total  0       Assessment:Patient is a 23 y.o. G2P1101 who is 4 weeks postpartum from a forceps delivery.  She is doing well; has occasional blurry vision and has neurologist appt tomorrow. Otherwise no signs of pre-e.   Plan: Patient's bp elevated today; 157-160 or 115/112 but patient is asymptomatic and has already received MgSo4.  Discussed with Dr. Macon Large, who recommends that patient increase labetalol to 600 BID and lisinopril to 20 BID. Patient verbalized plan of care, and new medications sent to pharmacy. Reviewed with patient that she should go to [facility] [place] if she starts to develop any signs/symptoms of seizure or neurologic changes.   1. Postpartum hypertension      RTC PRN  I discussed the assessment and treatment plan with the patient. The patient was provided an opportunity to ask questions and all were answered. The patient agreed with the plan and demonstrated an understanding of the instructions.   The patient was advised to call back or seek an in-person evaluation/go to the ED for any concerning postpartum symptoms.  I provided 20 minutes of non-face-to-face time during this encounter.   Marylene Land, CNM [facility] [employer], [facility]

## 2018-12-08 ENCOUNTER — Other Ambulatory Visit: Payer: Self-pay

## 2018-12-08 ENCOUNTER — Encounter: Payer: Self-pay | Admitting: Neurology

## 2018-12-08 ENCOUNTER — Encounter: Payer: Self-pay | Admitting: Obstetrics and Gynecology

## 2018-12-08 DIAGNOSIS — I6783 Posterior reversible encephalopathy syndrome: Secondary | ICD-10-CM

## 2018-12-09 ENCOUNTER — Ambulatory Visit: Payer: Self-pay | Admitting: Obstetrics & Gynecology

## 2018-12-14 ENCOUNTER — Encounter: Payer: Self-pay | Admitting: Family Medicine

## 2018-12-20 ENCOUNTER — Telehealth: Payer: Medicaid Other | Admitting: Family Medicine

## 2018-12-21 ENCOUNTER — Other Ambulatory Visit: Payer: Self-pay

## 2018-12-21 ENCOUNTER — Telehealth (INDEPENDENT_AMBULATORY_CARE_PROVIDER_SITE_OTHER): Payer: Medicaid Other | Admitting: Family Medicine

## 2018-12-21 DIAGNOSIS — I10 Essential (primary) hypertension: Secondary | ICD-10-CM | POA: Diagnosis not present

## 2018-12-21 MED ORDER — NIFEDIPINE ER OSMOTIC RELEASE 60 MG PO TB24: 60.0000 mg | ORAL_TABLET | Freq: Every day | ORAL | 1 refills | Status: DC

## 2018-12-21 NOTE — Progress Notes (Signed)
Virtual Visit via Video Note  I connected with Karen Booth on 12/21/18 at 10:30 AM EDT by a video enabled telemedicine application and verified that I am speaking with the correct person using two identifiers.  Location: Patient: Located at home during today's encounter  Provider: Located at primary care office     I discussed the limitations of evaluation and management by telemedicine and the availability of in person appointments. The patient expressed understanding and agreed to proceed.  History of Present Illness: Patient delivered first child 4 years ago and suffered from postpartum hypertension.  She ports visiting multiple providers however due to her age providers were reluctant to start her on blood pressure medicine in spite of her blood pressure remaining high.  During most recent pregnancy she was started on blood pressure medication she experienced preeclampsia and subsequent postpartum hypertension.  She is currently on labetalol and lisinopril and reports that home readings are consistently greater than 160/100. Denies chest pain, shortness of breath, weakness, dizziness, or lower extremity edema.  Assessment and Plan: 1. Essential hypertension Return to office 1 week thyroid function study and full set of vitals. Will titrate medications and or add medication in effort to reduce persistently elevated blood pressure.  Checking  - Thyroid Panel With TSH; Future  Follow Up Instructions: 1 week TSH panel and complete vitals check    I discussed the assessment and treatment plan with the patient. The patient was provided an opportunity to ask questions and all were answered. The patient agreed with the plan and demonstrated an understanding of the instructions.   The patient was advised to call back or seek an in-person evaluation if the symptoms worsen or if the condition fails to improve as anticipated.  I provided 25 minutes of non-face-to-face time during this  encounter.   Joaquin Courts, FNP -C

## 2018-12-29 ENCOUNTER — Telehealth (INDEPENDENT_AMBULATORY_CARE_PROVIDER_SITE_OTHER): Payer: Medicaid Other

## 2018-12-29 ENCOUNTER — Ambulatory Visit (INDEPENDENT_AMBULATORY_CARE_PROVIDER_SITE_OTHER): Payer: Medicaid Other

## 2018-12-29 ENCOUNTER — Other Ambulatory Visit: Payer: Self-pay

## 2018-12-29 VITALS — BP 157/122 | HR 88 | Temp 98.1°F | Resp 17

## 2018-12-29 DIAGNOSIS — Z0131 Encounter for examination of blood pressure with abnormal findings: Secondary | ICD-10-CM | POA: Diagnosis not present

## 2018-12-29 DIAGNOSIS — I10 Essential (primary) hypertension: Secondary | ICD-10-CM

## 2018-12-29 DIAGNOSIS — Z1389 Encounter for screening for other disorder: Secondary | ICD-10-CM

## 2018-12-29 DIAGNOSIS — D649 Anemia, unspecified: Secondary | ICD-10-CM

## 2018-12-29 NOTE — Telephone Encounter (Signed)
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

## 2018-12-29 NOTE — Patient Instructions (Addendum)
Please start nifedipine 60 mg along with other blood pressure medications. Return for hypertension follow-up visit with provider in 2 weeks. Please take all medications prior to follow-up visit.      Hypertension Hypertension, commonly called high blood pressure, is when the force of blood pumping through the arteries is too strong. The arteries are the blood vessels that carry blood from the heart throughout the body. Hypertension forces the heart to work harder to pump blood and may cause arteries to become narrow or stiff. Having untreated or uncontrolled hypertension can cause heart attacks, strokes, kidney disease, and other problems. A blood pressure reading consists of a higher number over a lower number. Ideally, your blood pressure should be below 120/80. The first ("top") number is called the systolic pressure. It is a measure of the pressure in your arteries as your heart beats. The second ("bottom") number is called the diastolic pressure. It is a measure of the pressure in your arteries as the heart relaxes. What are the causes? The cause of this condition is not known. What increases the risk? Some risk factors for high blood pressure are under your control. Others are not. Factors you can change  Smoking.  Having type 2 diabetes mellitus, high cholesterol, or both.  Not getting enough exercise or physical activity.  Being overweight.  Having too much fat, sugar, calories, or salt (sodium) in your diet.  Drinking too much alcohol. Factors that are difficult or impossible to change  Having chronic kidney disease.  Having a family history of high blood pressure.  Age. Risk increases with age.  Race. You may be at higher risk if you are African-American.  Gender. Men are at higher risk than women before age 73. After age 86, women are at higher risk than men.  Having obstructive sleep apnea.  Stress. What are the signs or symptoms? Extremely high blood pressure  (hypertensive crisis) may cause:  Headache.  Anxiety.  Shortness of breath.  Nosebleed.  Nausea and vomiting.  Severe chest pain.  Jerky movements you cannot control (seizures). How is this diagnosed? This condition is diagnosed by measuring your blood pressure while you are seated, with your arm resting on a surface. The cuff of the blood pressure monitor will be placed directly against the skin of your upper arm at the level of your heart. It should be measured at least twice using the same arm. Certain conditions can cause a difference in blood pressure between your right and left arms. Certain factors can cause blood pressure readings to be lower or higher than normal (elevated) for a short period of time:  When your blood pressure is higher when you are in a health care provider's office than when you are at home, this is called white coat hypertension. Most people with this condition do not need medicines.  When your blood pressure is higher at home than when you are in a health care provider's office, this is called masked hypertension. Most people with this condition may need medicines to control blood pressure. If you have a high blood pressure reading during one visit or you have normal blood pressure with other risk factors:  You may be asked to return on a different day to have your blood pressure checked again.  You may be asked to monitor your blood pressure at home for 1 week or longer. If you are diagnosed with hypertension, you may have other blood or imaging tests to help your health care provider understand your  overall risk for other conditions. How is this treated? This condition is treated by making healthy lifestyle changes, such as eating healthy foods, exercising more, and reducing your alcohol intake. Your health care provider may prescribe medicine if lifestyle changes are not enough to get your blood pressure under control, and if:  Your systolic blood  pressure is above 130.  Your diastolic blood pressure is above 80. Your personal target blood pressure may vary depending on your medical conditions, your age, and other factors. Follow these instructions at home: Eating and drinking   Eat a diet that is high in fiber and potassium, and low in sodium, added sugar, and fat. An example eating plan is called the DASH (Dietary Approaches to Stop Hypertension) diet. To eat this way: ? Eat plenty of fresh fruits and vegetables. Try to fill half of your plate at each meal with fruits and vegetables. ? Eat whole grains, such as whole wheat pasta, brown rice, or whole grain bread. Fill about one quarter of your plate with whole grains. ? Eat or drink low-fat dairy products, such as skim milk or low-fat yogurt. ? Avoid fatty cuts of meat, processed or cured meats, and poultry with skin. Fill about one quarter of your plate with lean proteins, such as fish, chicken without skin, beans, eggs, and tofu. ? Avoid premade and processed foods. These tend to be higher in sodium, added sugar, and fat.  Reduce your daily sodium intake. Most people with hypertension should eat less than 1,500 mg of sodium a day.  Limit alcohol intake to no more than 1 drink a day for nonpregnant women and 2 drinks a day for men. One drink equals 12 oz of beer, 5 oz of wine, or 1 oz of hard liquor. Lifestyle   Work with your health care provider to maintain a healthy body weight or to lose weight. Ask what an ideal weight is for you.  Get at least 30 minutes of exercise that causes your heart to beat faster (aerobic exercise) most days of the week. Activities may include walking, swimming, or biking.  Include exercise to strengthen your muscles (resistance exercise), such as pilates or lifting weights, as part of your weekly exercise routine. Try to do these types of exercises for 30 minutes at least 3 days a week.  Do not use any products that contain nicotine or tobacco,  such as cigarettes and e-cigarettes. If you need help quitting, ask your health care provider.  Monitor your blood pressure at home as told by your health care provider.  Keep all follow-up visits as told by your health care provider. This is important. Medicines  Take over-the-counter and prescription medicines only as told by your health care provider. Follow directions carefully. Blood pressure medicines must be taken as prescribed.  Do not skip doses of blood pressure medicine. Doing this puts you at risk for problems and can make the medicine less effective.  Ask your health care provider about side effects or reactions to medicines that you should watch for. Contact a health care provider if:  You think you are having a reaction to a medicine you are taking.  You have headaches that keep coming back (recurring).  You feel dizzy.  You have swelling in your ankles.  You have trouble with your vision. Get help right away if:  You develop a severe headache or confusion.  You have unusual weakness or numbness.  You feel faint.  You have severe pain in your chest  or abdomen.  You vomit repeatedly.  You have trouble breathing. Summary  Hypertension is when the force of blood pumping through your arteries is too strong. If this condition is not controlled, it may put you at risk for serious complications.  Your personal target blood pressure may vary depending on your medical conditions, your age, and other factors. For most people, a normal blood pressure is less than 120/80.  Hypertension is treated with lifestyle changes, medicines, or a combination of both. Lifestyle changes include weight loss, eating a healthy, low-sodium diet, exercising more, and limiting alcohol. This information is not intended to replace advice given to you by your health care provider. Make sure you discuss any questions you have with your health care provider. Document Released: 07/28/2005  Document Revised: 06/25/2016 Document Reviewed: 06/25/2016 Elsevier Interactive Patient Education  2019 [employer].

## 2018-12-29 NOTE — Progress Notes (Addendum)
Patient here for full vitals as well as fasting labs. Patient states that she has taken the Labetalol & Lisinopril today. Is prescribed Procardia but hasn't started taking it yet. BP was 157/122, pulse 88. Per provider patient to start Procardia & follow up in office in 2 weeks. Aware to take all medications prior to appointment. KWalker, CMA.

## 2018-12-30 NOTE — Telephone Encounter (Signed)
Returned pt's call regarding the MMR vaccine and advised her that it is okay for her to get the vaccine as she is no longer pregnant.  Pt asked about returning to work and advised her that we recommend that she have a postpartum appointment first and would send a message to the front office staff to schedule.  Pt verbalized understanding.

## 2018-12-31 LAB — COMPREHENSIVE METABOLIC PANEL
ALT: 44 IU/L — ABNORMAL HIGH (ref 0–32)
AST: 26 IU/L (ref 0–40)
Albumin/Globulin Ratio: 2.2 (ref 1.2–2.2)
Albumin: 4.6 g/dL (ref 3.9–5.0)
Alkaline Phosphatase: 95 IU/L (ref 39–117)
BUN/Creatinine Ratio: 22 (ref 9–23)
BUN: 18 mg/dL (ref 6–20)
Bilirubin Total: 0.4 mg/dL (ref 0.0–1.2)
CO2: 20 mmol/L (ref 20–29)
Calcium: 9.6 mg/dL (ref 8.7–10.2)
Chloride: 106 mmol/L (ref 96–106)
Creatinine, Ser: 0.82 mg/dL (ref 0.57–1.00)
GFR calc Af Amer: 117 mL/min/{1.73_m2} (ref >59)
GFR calc non Af Amer: 102 mL/min/{1.73_m2} (ref >59)
Globulin, Total: 2.1 g/dL (ref 1.5–4.5)
Glucose: 92 mg/dL (ref 65–99)
Potassium: 4.3 mmol/L (ref 3.5–5.2)
Sodium: 142 mmol/L (ref 134–144)
Total Protein: 6.7 g/dL (ref 6.0–8.5)

## 2018-12-31 LAB — CBC WITH DIFFERENTIAL/PLATELET
Basophils Absolute: 0 10*3/uL (ref 0.0–0.2)
Basos: 1 %
EOS (ABSOLUTE): 0.2 10*3/uL (ref 0.0–0.4)
Eos: 2 %
Hematocrit: 35 % (ref 34.0–46.6)
Hemoglobin: 11.4 g/dL (ref 11.1–15.9)
Immature Grans (Abs): 0 10*3/uL (ref 0.0–0.1)
Immature Granulocytes: 0 %
Lymphocytes Absolute: 3.5 10*3/uL — ABNORMAL HIGH (ref 0.7–3.1)
Lymphs: 49 %
MCH: 26.2 pg — ABNORMAL LOW (ref 26.6–33.0)
MCHC: 32.6 g/dL (ref 31.5–35.7)
MCV: 81 fL (ref 79–97)
Monocytes Absolute: 0.4 10*3/uL (ref 0.1–0.9)
Monocytes: 6 %
Neutrophils Absolute: 3 10*3/uL (ref 1.4–7.0)
Neutrophils: 42 %
Platelets: 274 10*3/uL (ref 150–450)
RBC: 4.35 x10E6/uL (ref 3.77–5.28)
RDW: 14.8 % (ref 11.7–15.4)
WBC: 7.1 10*3/uL (ref 3.4–10.8)

## 2018-12-31 LAB — THYROID PANEL WITH TSH
Free Thyroxine Index: 1.9 (ref 1.2–4.9)
T3 Uptake Ratio: 28 % (ref 24–39)
T4, Total: 6.8 ug/dL (ref 4.5–12.0)
TSH: 1.08 u[IU]/mL (ref 0.450–4.500)

## 2018-12-31 LAB — IRON,TIBC AND FERRITIN PANEL
Ferritin: 37 ng/mL (ref 15–150)
Iron Saturation: 9 % — CL (ref 15–55)
Iron: 33 ug/dL (ref 27–159)
Total Iron Binding Capacity: 356 ug/dL (ref 250–450)
UIBC: 323 ug/dL (ref 131–425)

## 2019-01-05 ENCOUNTER — Telehealth: Payer: Self-pay | Admitting: Family Medicine

## 2019-01-05 NOTE — Telephone Encounter (Signed)
Patient was returning a call from the nurse, please follow up °

## 2019-01-06 ENCOUNTER — Encounter: Payer: Self-pay | Admitting: Family Medicine

## 2019-01-06 NOTE — Telephone Encounter (Signed)
Patient called again requesting lab results,please follow up °

## 2019-01-07 NOTE — Progress Notes (Signed)
Patient notified of results & recommendations. Expressed understanding.

## 2019-01-10 ENCOUNTER — Other Ambulatory Visit: Payer: Self-pay

## 2019-01-10 ENCOUNTER — Encounter: Payer: Self-pay | Admitting: Advanced Practice Midwife

## 2019-01-10 ENCOUNTER — Telehealth (INDEPENDENT_AMBULATORY_CARE_PROVIDER_SITE_OTHER): Payer: Medicaid Other | Admitting: Advanced Practice Midwife

## 2019-01-10 DIAGNOSIS — Z1389 Encounter for screening for other disorder: Secondary | ICD-10-CM | POA: Diagnosis not present

## 2019-01-10 NOTE — Progress Notes (Signed)
I connected with Tora Perches on 01/10/19 at  9:15 AM EDT by: webex and verified that I am speaking with the correct person using two identifiers.  Patient is located at home and provider is located at office.     The purpose of this virtual visit is to provide medical care while limiting exposure to the novel coronavirus. I discussed the limitations, risks, security and privacy concerns of performing an evaluation and management service by [facility] [employer] and the availability of in person appointments. I also discussed with the patient that there may be a patient responsible charge related to this service. By engaging in this virtual visit, you consent to the provision of healthcare.  Additionally, you authorize for your insurance to be billed for the services provided during this visit.  The patient expressed understanding and agreed to proceed.  The following staff members participated in the virtual visit:  Skip Estimable Capel and Donia Ast, NP  Post Partum Visit Note Subjective:    Ms. Lalani Walp is a 23 y.o. G36P1101 female who presents for a postpartum visit. She is 8 weeks postpartum following a low forceps vaginal delivery. I have fully reviewed the prenatal and intrapartum course. The delivery was at 36.2 gestational weeks. Outcome: forceps, low. Anesthesia: epidural. Postpartum course has been complicated by HTN. Baby's course has been uncomplicated. Baby is feeding by bottle . Bleeding no bleeding. Bowel function is normal. Bladder function is normal. Patient is sexually active. Contraception method is Nexplanon. Postpartum depression screening: negative.  The following portions of the patient's history were reviewed and updated as appropriate: allergies, current medications, past family history, past medical history, past social history, past surgical history and problem list.  Review of Systems Pertinent items are noted in HPI.   Edinburgh Postnatal Depression Scale - 01/10/19  0924      Edinburgh Postnatal Depression Scale:  In the Past 7 Days   I have been able to laugh and see the funny side of things.  0    I have looked forward with enjoyment to things.  0    I have blamed myself unnecessarily when things went wrong.  0    I have been anxious or worried for no good reason.  0    I have felt scared or panicky for no good reason.  0    Things have been getting on top of me.  0    I have been so unhappy that I have had difficulty sleeping.  0    I have felt sad or miserable.  0    I have been so unhappy that I have been crying.  0    The thought of harming myself has occurred to me.  0    Edinburgh Postnatal Depression Scale Total  0        Objective:  There were no vitals filed for this visit. Self-Obtained       Assessment:   1. Postpartum care and examination      Plan:    1. Contraception: Nexplanon 2. Routine care 3. Follow up in: 1 year  or as needed.   Thressa Sheller DNP, CNM  01/10/19  9:54 AM

## 2019-01-10 NOTE — Progress Notes (Signed)
I connected with  Tora Perches on 01/10/19 at  9:15 AM EDT by telephone and verified that I am speaking with the correct person using two identifiers.   I discussed the limitations, risks, security and privacy concerns of performing an evaluation and management service by telephone and the availability of in person appointments. I also discussed with the patient that there may be a patient responsible charge related to this service. The patient expressed understanding and agreed to proceed.  Janene Madeira Raylin Winer, CMA 01/10/2019  9:22 AM

## 2019-01-10 NOTE — Patient Instructions (Signed)
01/10/19   To whom it may concern,    There are no contraindications for patient getting MMR vaccine.    Marcille Buffy DNP, CNM  01/10/19  9:53 AM

## 2019-01-12 ENCOUNTER — Ambulatory Visit: Payer: Medicaid Other | Admitting: Family Medicine

## 2019-01-13 ENCOUNTER — Ambulatory Visit: Payer: Medicaid Other | Admitting: Family Medicine

## 2019-01-18 ENCOUNTER — Telehealth: Payer: Medicaid Other | Admitting: Neurology

## 2019-01-18 ENCOUNTER — Other Ambulatory Visit: Payer: Self-pay

## 2019-01-24 ENCOUNTER — Ambulatory Visit: Payer: Medicaid Other | Admitting: Neurology

## 2019-02-21 ENCOUNTER — Other Ambulatory Visit: Payer: Self-pay

## 2019-02-21 DIAGNOSIS — Z20828 Contact with and (suspected) exposure to other viral communicable diseases: Secondary | ICD-10-CM | POA: Diagnosis not present

## 2019-02-21 DIAGNOSIS — I10 Essential (primary) hypertension: Secondary | ICD-10-CM | POA: Diagnosis not present

## 2019-02-21 DIAGNOSIS — Z87891 Personal history of nicotine dependence: Secondary | ICD-10-CM | POA: Diagnosis not present

## 2019-02-21 DIAGNOSIS — M7918 Myalgia, other site: Secondary | ICD-10-CM | POA: Diagnosis not present

## 2019-02-21 DIAGNOSIS — Z79899 Other long term (current) drug therapy: Secondary | ICD-10-CM | POA: Insufficient documentation

## 2019-02-21 DIAGNOSIS — R5383 Other fatigue: Secondary | ICD-10-CM | POA: Diagnosis not present

## 2019-02-21 DIAGNOSIS — R509 Fever, unspecified: Secondary | ICD-10-CM | POA: Diagnosis present

## 2019-02-21 DIAGNOSIS — R531 Weakness: Secondary | ICD-10-CM | POA: Diagnosis not present

## 2019-02-21 NOTE — ED Triage Notes (Signed)
Pt presents by [facility] for evaluation of increased weakness and fever. EMS reports temp of 101.6 and has not taken any OTC medications. EMS also reports pt had vaginal bleeding for last 9 days and first period since delivering in April.

## 2019-02-22 ENCOUNTER — Encounter (HOSPITAL_COMMUNITY): Payer: Self-pay | Admitting: Emergency Medicine

## 2019-02-22 ENCOUNTER — Emergency Department (HOSPITAL_COMMUNITY)
Admission: EM | Admit: 2019-02-22 | Discharge: 2019-02-22 | Disposition: A | Discharge status: Home / Self Care | Payer: Medicaid Other | Attending: Emergency Medicine | Admitting: Emergency Medicine

## 2019-02-22 ENCOUNTER — Emergency Department (HOSPITAL_COMMUNITY): Payer: Medicaid Other

## 2019-02-22 ENCOUNTER — Other Ambulatory Visit: Payer: Self-pay

## 2019-02-22 DIAGNOSIS — Z20822 Contact with and (suspected) exposure to covid-19: Secondary | ICD-10-CM

## 2019-02-22 LAB — COMPREHENSIVE METABOLIC PANEL
ALT: 86 U/L — ABNORMAL HIGH (ref 0–44)
AST: 64 U/L — ABNORMAL HIGH (ref 15–41)
Albumin: 4.3 g/dL (ref 3.5–5.0)
Alkaline Phosphatase: 83 U/L (ref 38–126)
Anion gap: 10 (ref 5–15)
BUN: 17 mg/dL (ref 6–20)
CO2: 22 mmol/L (ref 22–32)
Calcium: 8.9 mg/dL (ref 8.9–10.3)
Chloride: 106 mmol/L (ref 98–111)
Creatinine, Ser: 0.79 mg/dL (ref 0.44–1.00)
GFR calc Af Amer: >60 mL/min (ref >60)
GFR calc non Af Amer: >60 mL/min (ref >60)
Glucose, Bld: 95 mg/dL (ref 70–99)
Potassium: 3.5 mmol/L (ref 3.5–5.1)
Sodium: 138 mmol/L (ref 135–145)
Total Bilirubin: 0.3 mg/dL (ref 0.3–1.2)
Total Protein: 7.5 g/dL (ref 6.5–8.1)

## 2019-02-22 LAB — CBC WITH DIFFERENTIAL/PLATELET
Abs Immature Granulocytes: 0.01 10*3/uL (ref 0.00–0.07)
Basophils Absolute: 0 10*3/uL (ref 0.0–0.1)
Basophils Relative: 1 %
Eosinophils Absolute: 0 10*3/uL (ref 0.0–0.5)
Eosinophils Relative: 0 %
HCT: 41 % (ref 36.0–46.0)
Hemoglobin: 13.1 g/dL (ref 12.0–15.0)
Immature Granulocytes: 0 %
Lymphocytes Relative: 47 %
Lymphs Abs: 1.5 10*3/uL (ref 0.7–4.0)
MCH: 25.4 pg — ABNORMAL LOW (ref 26.0–34.0)
MCHC: 32 g/dL (ref 30.0–36.0)
MCV: 79.5 fL — ABNORMAL LOW (ref 80.0–100.0)
Monocytes Absolute: 0.2 10*3/uL (ref 0.1–1.0)
Monocytes Relative: 7 %
Neutro Abs: 1.5 10*3/uL — ABNORMAL LOW (ref 1.7–7.7)
Neutrophils Relative %: 45 %
Platelets: 143 10*3/uL — ABNORMAL LOW (ref 150–400)
RBC: 5.16 MIL/uL — ABNORMAL HIGH (ref 3.87–5.11)
RDW: 15.6 % — ABNORMAL HIGH (ref 11.5–15.5)
WBC: 3.3 10*3/uL — ABNORMAL LOW (ref 4.0–10.5)
nRBC: 0 % (ref 0.0–0.2)

## 2019-02-22 LAB — D-DIMER, QUANTITATIVE: D-Dimer, Quant: 1.84 ug/mL-FEU — ABNORMAL HIGH (ref 0.00–0.50)

## 2019-02-22 LAB — URINALYSIS, ROUTINE W REFLEX MICROSCOPIC
Bilirubin Urine: NEGATIVE
Glucose, UA: NEGATIVE mg/dL
Ketones, ur: NEGATIVE mg/dL
Nitrite: NEGATIVE
Protein, ur: 100 mg/dL — AB
RBC / HPF: >50 RBC/hpf — ABNORMAL HIGH (ref 0–5)
Specific Gravity, Urine: 1.031 — ABNORMAL HIGH (ref 1.005–1.030)
pH: 5 (ref 5.0–8.0)

## 2019-02-22 LAB — FERRITIN: Ferritin: 40 ng/mL (ref 11–307)

## 2019-02-22 LAB — LIPASE, BLOOD: Lipase: 25 U/L (ref 11–51)

## 2019-02-22 LAB — LACTATE DEHYDROGENASE: LDH: 230 U/L — ABNORMAL HIGH (ref 98–192)

## 2019-02-22 LAB — POC URINE PREG, ED: Preg Test, Ur: NEGATIVE

## 2019-02-22 LAB — SARS CORONAVIRUS 2 BY RT PCR (HOSPITAL ORDER, PERFORMED IN ~~LOC~~ HOSPITAL LAB): SARS Coronavirus 2: NEGATIVE

## 2019-02-22 MED ORDER — ACETAMINOPHEN 500 MG PO TABS
1000.0000 mg | ORAL_TABLET | Freq: Once | ORAL | Status: AC
  Administered 2019-02-22: 1000 mg via ORAL
  Filled 2019-02-22: qty 2

## 2019-02-22 NOTE — ED Provider Notes (Signed)
[facility]-EMERGENCY DEPT Provider Note   CSN: [id number] Arrival date & time: 02/21/19  2204     History   Chief Complaint Chief Complaint  Patient presents with  . Fever    HPI Karen Booth is a 23 y.o. female.     Patient presents to the emergency department with a chief complaint of fever.  She reports associated weakness and fatigue and generalized body aches.  She states that she had a fever of 102 degrees at home today.  She has not tried taking anything for her symptoms.  She denies any sore throat, cough, shortness of breath, nausea, vomiting, diarrhea, dysuria or hematuria.  She states that she is on her menstrual cycle.  She denies any other unusual vaginal discharge or bleeding.  She denies any known sick contacts.  Karen Booth was evaluated in Emergency Department on 02/22/2019 for the symptoms described in the history of present illness. She was evaluated in the context of the global COVID-19 pandemic, which necessitated consideration that the patient might be at risk for infection with the SARS-CoV-2 virus that causes COVID-19. Institutional protocols and algorithms that pertain to the evaluation of patients at risk for COVID-19 are in a state of rapid change based on information released by regulatory bodies including the CDC and federal and state organizations. These policies and algorithms were followed during the patient's care in the ED.   The history is provided by the patient. No language interpreter was used.    Past Medical History:  Diagnosis Date  . Anxiety    never discussed or been treated, 'always had it, been coming up lately"  . Chlamydia   . Heart murmur   . Hypertension   . Infection    UTI  . Urinary tract infection     Patient Active Problem List   Diagnosis Date Noted  . Chronic hypertension 12/07/2018  . 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 MDD (major depressive disorder), single episode, severe , no psychosis (HCC)      Past Surgical History:  Procedure Laterality Date  . ADENOIDECTOMY    . TONSILLECTOMY       OB History    Gravida  2   Para  2   Term  1   Preterm  1   AB      Living  1     SAB      TAB      Ectopic      Multiple      Live Births  1            Home Medications    Prior to Admission medications   Medication Sig Start Date End Date Taking? Authorizing Provider  etonogestrel (NEXPLANON) 68 MG IMPL implant 1 each by Subdermal route once. Placed 11/2018    [provider]  labetalol (NORMODYNE) 300 MG tablet Take 2 tablets (600 mg total) by mouth 2 (two) times daily. 12/07/18   Marylene LandKooistra, Kathryn Lorraine, CNM  lisinopril (ZESTRIL) 20 MG tablet Take 1 tablet (20 mg total) by mouth 2 (two) times a day. 12/07/18   Marylene LandKooistra, Kathryn Lorraine, CNM  NIFEdipine (PROCARDIA XL) 60 MG 24 hr tablet Take 1 tablet (60 mg total) by mouth daily. Patient not taking: Reported on 12/29/2018 12/21/18   Bing NeighborsHarris, Kimberly S, FNP  Prenatal Vit-Fe Fumarate-FA (PRENATAL MULTIVITAMIN) TABS tablet Take 1 tablet by mouth daily at 12 noon.    [provider]  sertraline (ZOLOFT) 50 MG tablet Take 1  tablet (50 mg total) by mouth daily. 11/13/18   Adam PhenixArnold, James G, MD    Family History Family History  Problem Relation Age of Onset  . Heart disease Mother   . Hypertension Mother   . Heart murmur Mother   . Arrhythmia Mother   . Hypertension Father   . Breast cancer Maternal Grandmother   . Alzheimer's disease Maternal Grandmother   . Breast cancer Paternal Grandmother   . Esophageal cancer Maternal Uncle   . Diabetes Paternal Aunt   . Stomach cancer Maternal Uncle   . Heart disease Maternal Grandfather   . Hypertension Maternal Grandfather   . Stroke Maternal Grandfather   . Breast cancer Maternal Great-grandmother   . Hyperlipidemia Maternal Great-grandfather   . Stroke Maternal Great-grandfather   . Hearing loss Neg Hx     Social History Social History    Tobacco Use  . Smoking status: Former Smoker    Types: Cigarettes  . Smokeless tobacco: Never Used  . Tobacco comment: quit  before preg  Substance Use Topics  . Alcohol use: No  . Drug use: Not Currently    Types: Marijuana    Comment: last use 30 May 2018     Allergies   Ibuprofen   Review of Systems Review of Systems  All other systems reviewed and are negative.    Physical Exam Updated Vital Signs BP (!) 129/96 (BP Location: Left Arm)   Pulse (!) 106   Temp (!) 103.1 F (39.5 C) (Oral)   Resp 15   Ht 5\' 1"  (1.549 m)   Wt 72.3 kg   LMP 02/12/2019   SpO2 100%   BMI 30.10 kg/m   Physical Exam Vitals signs and nursing note reviewed.  Constitutional:      General: She is not in acute distress.    Appearance: She is well-developed.  HENT:     Head: Normocephalic and atraumatic.     Mouth/Throat:     Comments: Oropharynx is clear, no tonsillar exudate Eyes:     Conjunctiva/sclera: Conjunctivae normal.  Neck:     Musculoskeletal: Neck supple.  Cardiovascular:     Rate and Rhythm: Normal rate and regular rhythm.     Heart sounds: No murmur.  Pulmonary:     Effort: Pulmonary effort is normal. No respiratory distress.     Breath sounds: Normal breath sounds.  Abdominal:     Palpations: Abdomen is soft.     Tenderness: There is abdominal tenderness.     Comments: Epigastric tenderness  Skin:    General: Skin is warm and dry.  Neurological:     Mental Status: She is alert and oriented to person, place, and time.  Psychiatric:        Mood and Affect: Mood normal.        Behavior: Behavior normal.        Thought Content: Thought content normal.        Judgment: Judgment normal.      ED Treatments / Results  Labs (all labs ordered are listed, but only abnormal results are displayed) Labs Reviewed  URINALYSIS, ROUTINE W REFLEX MICROSCOPIC - Abnormal; Notable for the following components:      Result Value   APPearance HAZY (*)    Specific Gravity,  Urine 1.031 (*)    Hgb urine dipstick LARGE (*)    Protein, ur 100 (*)    Leukocytes,Ua TRACE (*)    RBC / HPF >50 (*)    Bacteria, UA  RARE (*)    All other components within normal limits  SARS CORONAVIRUS 2 (HOSPITAL ORDER, [place])  POC URINE PREG, ED    EKG None  Radiology No results found.  Procedures Procedures (including critical care time)  Medications Ordered in ED Medications  acetaminophen (TYLENOL) tablet 1,000 mg (has no administration in time range)     Initial Impression / Assessment and Plan / ED Course  I have reviewed the triage vital signs and the nursing notes.  Pertinent labs & imaging results that were available during my care of the patient were reviewed by me and considered in my medical decision making (see chart for details).        Patient here with fever.  She is also had generalized body aches.  I am very suspicious of coronavirus.  Rapid coronavirus test is negative, however question the sample.  D-dimer is elevated, LDH is elevated.  Patient has been around at risk populations both at work and in her home life.  We will send for outpatient testing.  Will have patient isolate/quarantine for 14 days.  Work note provided.  Return precautions discussed.  Final Clinical Impressions(s) / ED Diagnoses   Final diagnoses:  None    ED Discharge Orders    None       Montine Circle, PA-C 02/22/19 1017    Palumbo, April, MD 02/22/19 2332

## 2019-02-22 NOTE — Discharge Instructions (Signed)
You must isolate/quarantine for 14 days.

## 2019-02-25 LAB — NOVEL CORONAVIRUS, NAA (HOSP ORDER, SEND-OUT TO REF LAB; TAT 18-24 HRS): SARS-CoV-2, NAA: NOT DETECTED

## 2019-03-17 ENCOUNTER — Other Ambulatory Visit: Payer: Self-pay

## 2019-03-17 ENCOUNTER — Ambulatory Visit
Admission: EM | Admit: 2019-03-17 | Discharge: 2019-03-17 | Disposition: A | Discharge status: Home / Self Care | Payer: Medicaid Other | Attending: Emergency Medicine | Admitting: Emergency Medicine

## 2019-03-17 ENCOUNTER — Encounter: Payer: Self-pay | Admitting: Emergency Medicine

## 2019-03-17 DIAGNOSIS — J029 Acute pharyngitis, unspecified: Secondary | ICD-10-CM | POA: Insufficient documentation

## 2019-03-17 DIAGNOSIS — Z87891 Personal history of nicotine dependence: Secondary | ICD-10-CM | POA: Diagnosis not present

## 2019-03-17 LAB — POCT RAPID STREP A (OFFICE): Rapid Strep A Screen: NEGATIVE

## 2019-03-17 MED ORDER — LIDOCAINE VISCOUS HCL 2 % MT SOLN: 15.0000 mL | OROMUCOSAL | 1 refills | Status: DC | PRN

## 2019-03-17 NOTE — ED Provider Notes (Signed)
[facility]    CSN: [id number] Arrival date & time: 03/17/19  1003     History   Chief Complaint Chief Complaint  Patient presents with  . Sore Throat    HPI Karen Booth is a 23 y.o. female presenting for 2-day course of fever, sore throat and rhinorrhea.  Patient has not tried thing for her symptoms: Denies decreased appetite, ear pain, cough, shortness of breath.  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  Patient denies known sick contacts or contacts of covered positive persons.  Of note, patient's blood pressure is elevated: Denies headache, chest pain, abdominal pain.  Has blood pressure medications at home which she states she will take today.    Past Medical History:  Diagnosis Date  . Anxiety    never discussed or been treated, 'always had it, been coming up lately"  . Chlamydia   . Heart murmur   . Hypertension   . Infection    UTI  . Urinary tract infection     Patient Active Problem List   Diagnosis Date Noted  . Chronic hypertension 12/07/2018  . 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 MDD (major depressive disorder), single episode, severe , no psychosis (HCC)     Past Surgical History:  Procedure Laterality Date  . ADENOIDECTOMY    . TONSILLECTOMY      OB History    Gravida  2   Para  2   Term  1   Preterm  1   AB      Living  1     SAB      TAB      Ectopic      Multiple      Live Births  1            Home Medications    Prior to Admission medications   Medication Sig Start Date End Date Taking? Authorizing Provider  etonogestrel (NEXPLANON) 68 MG IMPL implant 1 each by Subdermal route once. Placed 11/2018    [provider]  labetalol (NORMODYNE) 300 MG tablet Take 2 tablets (600 mg total) by mouth 2 (two) times daily. Patient not taking: Reported on 02/22/2019 12/07/18   Marylene LandKooistra, Kathryn Lorraine, CNM  lidocaine  (XYLOCAINE) 2 % solution Use as directed 15 mLs in the mouth or throat as needed for mouth pain. 03/17/19   Hall-Potvin, [place], PA-C  lisinopril (ZESTRIL) 20 MG tablet Take 1 tablet (20 mg total) by mouth 2 (two) times a day. Patient not taking: Reported on 02/22/2019 12/07/18   Marylene LandKooistra, Kathryn Lorraine, CNM  NIFEdipine (PROCARDIA XL) 60 MG 24 hr tablet Take 1 tablet (60 mg total) by mouth daily. Patient not taking: Reported on 12/29/2018 12/21/18   Bing NeighborsHarris, Kimberly S, FNP  sertraline (ZOLOFT) 50 MG tablet Take 1 tablet (50 mg total) by mouth daily. Patient not taking: Reported on 02/22/2019 11/13/18   Adam PhenixArnold, James G, MD    Family History Family History  Problem Relation Age of Onset  . Heart disease Mother   . Hypertension Mother   . Heart murmur Mother   . Arrhythmia Mother   . Hypertension Father   . Breast cancer Maternal Grandmother   . Alzheimer's disease Maternal Grandmother   . Breast cancer Paternal Grandmother   . Esophageal cancer Maternal Uncle   . Diabetes Paternal Aunt   . Stomach cancer Maternal Uncle   .  Heart disease Maternal Grandfather   . Hypertension Maternal Grandfather   . Stroke Maternal Grandfather   . Breast cancer Maternal Great-grandmother   . Hyperlipidemia Maternal Great-grandfather   . Stroke Maternal Great-grandfather   . Hearing loss Neg Hx     Social History Social History   Tobacco Use  . Smoking status: Former Smoker    Types: Cigarettes  . Smokeless tobacco: Never Used  . Tobacco comment: quit  before preg  Substance Use Topics  . Alcohol use: No  . Drug use: Not Currently    Types: Marijuana    Comment: last use 30 May 2018     Allergies   Ibuprofen   Review of Systems Review of Systems  Constitutional: Positive for fever. Negative for appetite change and fatigue.  HENT: Positive for sore throat. Negative for ear pain, sinus pain and voice change.   Eyes: Negative for pain, redness and visual disturbance.  Respiratory:  Negative for cough, shortness of breath and wheezing.   Cardiovascular: Negative for chest pain and palpitations.  Gastrointestinal: Negative for abdominal pain, diarrhea and vomiting.  Musculoskeletal: Negative for arthralgias and myalgias.  Skin: Negative for rash and wound.  Neurological: Negative for syncope and headaches.     Physical Exam Triage Vital Signs ED Triage Vitals  Enc Vitals Group     BP      Pulse      Resp      Temp      Temp src      SpO2      Weight      Height      Head Circumference      Peak Flow      Pain Score      Pain Loc      Pain Edu?      Excl. in GC?    No data found.  Updated Vital Signs BP (!) 157/104 (BP Location: Left Arm)   Pulse (!) 103   Temp 100 F (37.8 C) (Oral)   Resp 18   SpO2 98%   Visual Acuity Right Eye Distance:   Left Eye Distance:   Bilateral Distance:    Right Eye Near:   Left Eye Near:    Bilateral Near:     Physical Exam Constitutional:      General: She is not in acute distress. HENT:     Head: Normocephalic and atraumatic.     Jaw: There is normal jaw occlusion. No tenderness or pain on movement.     Right Ear: Hearing, tympanic membrane, ear canal and external ear normal. No tenderness. No mastoid tenderness.     Left Ear: Hearing, tympanic membrane, ear canal and external ear normal. No tenderness. No mastoid tenderness.     Nose: No nasal deformity, septal deviation or nasal tenderness.     Right Turbinates: Not swollen or pale.     Left Turbinates: Not swollen or pale.     Right Sinus: No maxillary sinus tenderness or frontal sinus tenderness.     Left Sinus: No maxillary sinus tenderness or frontal sinus tenderness.     Mouth/Throat:     Lips: Pink. No lesions.     Mouth: Mucous membranes are moist. No injury.     Pharynx: Oropharynx is clear. Uvula midline. Posterior oropharyngeal erythema present. No uvula swelling.     Comments: Tonsils are surgically absent Eyes:     Conjunctiva/sclera:  Conjunctivae normal.     Pupils: Pupils are equal, round,  and reactive to light.  Neck:     Musculoskeletal: Normal range of motion and neck supple. No muscular tenderness.  Cardiovascular:     Rate and Rhythm: Normal rate.  Pulmonary:     Effort: Pulmonary effort is normal.  Lymphadenopathy:     Cervical: No cervical adenopathy.  Neurological:     Mental Status: She is alert and oriented to person, place, and time.      UC Treatments / Results  Labs (all labs ordered are listed, but only abnormal results are displayed) Labs Reviewed  NOVEL CORONAVIRUS, NAA  CULTURE, GROUP A STREP [facility])  POCT RAPID STREP A (OFFICE)    EKG   Radiology No results found.  Procedures Procedures (including critical care time)  Medications Ordered in UC Medications - No data to display  Initial Impression / Assessment and Plan / UC Course  I have reviewed the triage vital signs and the nursing notes.  Pertinent labs & imaging results that were available during my care of the patient were reviewed by me and considered in my medical decision making (see chart for details).     1.  Sore throat Rapid strep negative, culture pending.  Will trial viscous lidocaine for pain relief.  Covid test pending: Isolation return precautions discussed, patient verbalized understanding and is agreeable to plan.  Final Clinical Impressions(s) / UC Diagnoses   Final diagnoses:  Sore throat     Discharge Instructions     Your COVID test is pending: Is important you quarantine at home until your results are back. You may take OTC Tylenol for fever and myalgias.  It is important to drink plenty of water throughout the day to stay hydrated. If you test positive and later develop severe fever, cough, or shortness of breath, it is recommended that you go to the ER for further evaluation.    ED Prescriptions    Medication Sig Dispense Auth. Provider   lidocaine (XYLOCAINE) 2 % solution Use as directed  15 mLs in the mouth or throat as needed for mouth pain. 100 mL Hall-Potvin, [place], PA-C     Controlled Substance Prescriptions [place] Controlled Substance Registry consulted? Not Applicable   Quincy Sheehan, [place] 03/17/19 1042

## 2019-03-17 NOTE — ED Triage Notes (Signed)
Pt presents to [facility] for assessment of sore throat with "clearing my throat cough" x 2 days.  Sent home from work today due to symptoms.

## 2019-03-17 NOTE — Discharge Instructions (Signed)
Your COVID test is pending: Is important you quarantine at home until your results are back. °You may take OTC Tylenol for fever and myalgias.  It is important to drink plenty of water throughout the day to stay hydrated. °If you test positive and later develop severe fever, cough, or shortness of breath, it is recommended that you go to the ER for further evaluation. °

## 2019-03-18 LAB — NOVEL CORONAVIRUS, NAA: SARS-CoV-2, NAA: NOT DETECTED

## 2019-03-20 LAB — CULTURE, GROUP A STREP (THRC)

## 2019-03-21 ENCOUNTER — Encounter (HOSPITAL_COMMUNITY): Payer: Self-pay

## 2019-06-30 ENCOUNTER — Other Ambulatory Visit: Payer: Self-pay

## 2019-06-30 ENCOUNTER — Encounter: Payer: Self-pay | Admitting: Emergency Medicine

## 2019-06-30 ENCOUNTER — Ambulatory Visit
Admission: EM | Admit: 2019-06-30 | Discharge: 2019-06-30 | Disposition: A | Discharge status: Home / Self Care | Payer: Medicaid Other | Attending: Nurse Practitioner | Admitting: Nurse Practitioner

## 2019-06-30 DIAGNOSIS — N3001 Acute cystitis with hematuria: Secondary | ICD-10-CM | POA: Diagnosis present

## 2019-06-30 DIAGNOSIS — Z7251 High risk heterosexual behavior: Secondary | ICD-10-CM | POA: Diagnosis present

## 2019-06-30 DIAGNOSIS — Z113 Encounter for screening for infections with a predominantly sexual mode of transmission: Secondary | ICD-10-CM | POA: Diagnosis present

## 2019-06-30 DIAGNOSIS — B9689 Other specified bacterial agents as the cause of diseases classified elsewhere: Secondary | ICD-10-CM

## 2019-06-30 LAB — POCT URINALYSIS DIP (MANUAL ENTRY)
Bilirubin, UA: NEGATIVE
Glucose, UA: NEGATIVE mg/dL
Ketones, POC UA: NEGATIVE mg/dL
Nitrite, UA: NEGATIVE
Protein Ur, POC: 30 mg/dL — AB
Spec Grav, UA: >=1.03 — AB (ref 1.010–1.025)
Urobilinogen, UA: 0.2 E.U./dL
pH, UA: 5.5 (ref 5.0–8.0)

## 2019-06-30 MED ORDER — CIPROFLOXACIN HCL 500 MG PO TABS: 500.0000 mg | ORAL_TABLET | Freq: Two times a day (BID) | ORAL | 0 refills | Status: DC

## 2019-06-30 NOTE — ED Triage Notes (Signed)
Pt presents to [facility] for assessment of 1 week of burning with urination.  Denies discharge.  Denies lower abdominal or back pain.

## 2019-06-30 NOTE — ED Notes (Signed)
Patient able to ambulate independently  

## 2019-06-30 NOTE — Discharge Instructions (Signed)
Take medications as prescribed. Drink plenty of fluids. You will only be notified for positive results. You can go online to MyChart in a few days to review all your results.   Take care, Aldona Bar

## 2019-06-30 NOTE — ED Provider Notes (Addendum)
[facility]    CSN: [id number] Arrival date & time: 06/30/19  1229      History   Chief Complaint Chief Complaint  Patient presents with   Dysuria    HPI Karen Booth is a 23 y.o. female.   Subjective:  Karen Booth is a 23 y.o. female who complains of dysuria for 1 week. Patient denies back pain, flank pain, fever, vaginal discharge, hematuria, abdominal pain, nausea, vomiting, vaginal irritation or hematuria.  Patient does not have a history of recurrent UTI.  Patient does not have a history of pyelonephritis. Patient is also requesting STD testing. She is currently sexually active with two different female partners. She does not use condoms. She is on birth control (nexplanon) and doesn't have regular cycles. Last STD testing was over 3 years ago.          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 Authorizing Provider  ciprofloxacin (CIPRO) 500 MG tablet Take 1 tablet (500 mg total) by mouth 2 (two) times daily. 06/30/19   Enrique Sack, FNP  etonogestrel (NEXPLANON) 68 MG IMPL implant 1 each by Subdermal route once. Placed 11/2018    [provider]  labetalol (NORMODYNE) 300 MG tablet  Take 2 tablets (600 mg total) by mouth 2 (two) times daily. Patient not taking: Reported on 02/22/2019 12/07/18 06/30/19  Starr Lake, CNM  lisinopril (ZESTRIL) 20 MG tablet Take 1 tablet (20 mg total) by mouth 2 (two) times a day. Patient not taking: Reported on 02/22/2019 12/07/18 06/30/19  Starr Lake, CNM  NIFEdipine (PROCARDIA XL) 60 MG 24 hr tablet Take 1 tablet (60 mg total) by mouth daily. Patient not taking: Reported on 12/29/2018 12/21/18 06/30/19  Scot Jun, FNP  sertraline (ZOLOFT) 50 MG tablet Take 1 tablet (50 mg total) by mouth daily. Patient not taking: Reported on 02/22/2019 11/13/18 06/30/19  Woodroe Mode, MD    Family History Family History  Problem Relation Age of Onset   Heart disease Mother    Hypertension Mother    Heart murmur Mother    Arrhythmia Mother    Hypertension Father    Breast cancer Maternal Grandmother    Alzheimer's disease Maternal Grandmother    Breast cancer Paternal Grandmother    Esophageal cancer Maternal Uncle    Diabetes Paternal Aunt    Stomach  cancer Maternal Uncle    Heart disease Maternal Grandfather    Hypertension Maternal Grandfather    Stroke Maternal Grandfather    Breast cancer Maternal Great-grandmother    Hyperlipidemia Maternal Great-grandfather    Stroke Maternal Great-grandfather    Hearing loss Neg Hx     Social History Social History   Tobacco Use   Smoking status: Former Smoker    Types: Cigarettes   Smokeless tobacco: Never Used   Tobacco comment: quit  before preg  Substance Use Topics   Alcohol use: No   Drug use: Not Currently    Types: Marijuana    Comment: last use 30 May 2018     Allergies   Ibuprofen   Review of Systems Review of Systems  Constitutional: Negative for fever.  Gastrointestinal: Negative for abdominal pain, nausea and vomiting.  Genitourinary: Positive for dysuria. Negative for difficulty urinating, frequency, hematuria  and menstrual problem.  Musculoskeletal: Negative for back pain.  All other systems reviewed and are negative.    Physical Exam Triage Vital Signs ED Triage Vitals  Enc Vitals Group     BP 06/30/19 1235 128/88     Pulse Rate 06/30/19 1235 (!) 119     Resp 06/30/19 1235 18     Temp 06/30/19 1235 99.7 F (37.6 C)     Temp Source 06/30/19 1235 Oral     SpO2 06/30/19 1235 97 %     Weight --      Height --      Head Circumference --      Peak Flow --      Pain Score 06/30/19 1234 0     Pain Loc --      Pain Edu? --      Excl. in GC? --    No data found.  Updated Vital Signs BP 128/88 (BP Location: Left Arm)    Pulse (!) 112    Temp 99.7 F (37.6 C) (Oral)    Resp 18    SpO2 97%   Visual Acuity Right Eye Distance:   Left Eye Distance:   Bilateral Distance:    Right Eye Near:   Left Eye Near:    Bilateral Near:     Physical Exam Vitals signs reviewed.  Constitutional:      Appearance: Normal appearance. She is not ill-appearing or toxic-appearing.  HENT:     Head: Normocephalic.  Neck:     Musculoskeletal: Normal range of motion and neck supple.  Cardiovascular:     Rate and Rhythm: Regular rhythm. Tachycardia present.  Pulmonary:     Effort: Pulmonary effort is normal.     Breath sounds: Normal breath sounds.  Abdominal:     Tenderness: There is no right CVA tenderness or left CVA tenderness.  Musculoskeletal: Normal range of motion.  Skin:    General: Skin is warm and dry.  Neurological:     General: No focal deficit present.     Mental Status: She is alert and oriented to person, place, and time.      UC Treatments / Results  Labs (all labs ordered are listed, but only abnormal results are displayed) Labs Reviewed  POCT URINALYSIS DIP (MANUAL ENTRY) - Abnormal; Notable for the following components:      Result Value   Clarity, UA cloudy (*)    Spec Grav, UA >=1.030 (*)    Blood, UA large (*)    Protein Ur, POC =30 (*)    Leukocytes, UA Small  (  1+) (*)    All other components within normal limits  HIV ANTIBODY (ROUTINE TESTING W REFLEX)  RPR  CERVICOVAGINAL ANCILLARY ONLY    EKG   Radiology No results found.  Procedures Procedures (including critical care time)  Medications Ordered in UC Medications - No data to display  Initial Impression / Assessment and Plan / UC Course  I have reviewed the triage vital signs and the nursing notes.  Pertinent labs & imaging results that were available during my care of the patient were reviewed by me and considered in my medical decision making (see chart for details).    23 y.o. female who complains of dysuria and requesting STD testing. No back pain, flank pain, fever, vaginal discharge, hematuria, abdominal pain, nausea, vomiting, vaginal irritation or hematuria. Patient has low grade fever in clinic and mildly tachycardiac. Physical exam unremarkable.  Plan:  1. Medications: ciprofloxacin 500 mg BID x 7 days 2. Further treatment pending STD test results  3. Maintain adequate hydration 4. Safe sex dicussed  5. Follow up if symptoms not improving, and prn.  Today's evaluation has revealed no signs of a dangerous process. Discussed diagnosis with patient and/or guardian. Patient and/or guardian aware of their diagnosis, possible red flag symptoms to watch out for and need for close follow up. Patient and/or guardian understands verbal and written discharge instructions. Patient and/or guardian comfortable with plan and disposition.  Patient and/or guardian has a clear mental status at this time, good insight into illness (after discussion and teaching) and has clear judgment to make decisions regarding their care  This care was provided during an unprecedented National Emergency due to the Novel Coronavirus (COVID-19) pandemic. COVID-19 infections and transmission risks place heavy strains on healthcare resources.  As this pandemic evolves, our facility, providers, and staff strive  to respond fluidly, to remain operational, and to provide care relative to available resources and information. Outcomes are unpredictable and treatments are without well-defined guidelines. Further, the impact of COVID-19 on all aspects of urgent care, including the impact to patients seeking care for reasons other than COVID-19, is unavoidable during this national emergency. At this time of the global pandemic, management of patients has significantly changed, even for non-COVID positive patients given high local and regional COVID volumes at this time requiring high healthcare system and resource utilization. The standard of care for management of both COVID suspected and non-COVID suspected patients continues to change rapidly at the local, regional, national, and global levels. This patient was worked up and treated to the best available but ever changing evidence and resources available at this current time.   Documentation was completed with the aid of voice recognition software. Transcription may contain typographical errors.  Final Clinical Impressions(s) / UC Diagnoses   Final diagnoses:  High risk heterosexual behavior  Acute cystitis with hematuria  Screening examination for STD (sexually transmitted disease)     Discharge Instructions     Take medications as prescribed. Drink plenty of fluids. You will only be notified for positive results. You can go online to MyChart in a few days to review all your results.   Take care, [facility]     ED Prescriptions    Medication Sig Dispense Auth. Provider   ciprofloxacin (CIPRO) 500 MG tablet Take 1 tablet (500 mg total) by mouth 2 (two) times daily. 14 tablet Lurline IdolMurrill, Arieal Cuoco, FNP     PDMP not reviewed this encounter.   Lurline IdolMurrill, Abel Ra, FNP 06/30/19 1257    Lurline IdolMurrill, Kourtney Montesinos, FNP  06/30/19 1258 ° °

## 2019-07-01 LAB — RPR: RPR Ser Ql: NONREACTIVE

## 2019-07-01 LAB — HIV ANTIBODY (ROUTINE TESTING W REFLEX): HIV Screen 4th Generation wRfx: NONREACTIVE

## 2019-07-05 ENCOUNTER — Encounter (HOSPITAL_COMMUNITY): Payer: Self-pay

## 2019-07-05 ENCOUNTER — Ambulatory Visit
Admission: EM | Admit: 2019-07-05 | Discharge: 2019-07-05 | Disposition: A | Discharge status: Home / Self Care | Payer: Medicaid Other | Attending: Physician Assistant | Admitting: Physician Assistant

## 2019-07-05 ENCOUNTER — Telehealth (HOSPITAL_COMMUNITY): Payer: Self-pay | Admitting: Emergency Medicine

## 2019-07-05 ENCOUNTER — Other Ambulatory Visit: Payer: Self-pay

## 2019-07-05 DIAGNOSIS — Z202 Contact with and (suspected) exposure to infections with a predominantly sexual mode of transmission: Secondary | ICD-10-CM

## 2019-07-05 LAB — CERVICOVAGINAL ANCILLARY ONLY
Bacterial vaginitis: POSITIVE — AB
Candida vaginitis: NEGATIVE
Chlamydia: NEGATIVE
Neisseria Gonorrhea: POSITIVE — AB
Trichomonas: NEGATIVE

## 2019-07-05 MED ORDER — METRONIDAZOLE 500 MG PO TABS: 500.0000 mg | ORAL_TABLET | Freq: Two times a day (BID) | ORAL | 0 refills | Status: AC

## 2019-07-05 MED ORDER — CEFTRIAXONE SODIUM 250 MG IJ SOLR
250.0000 mg | Freq: Once | INTRAMUSCULAR | Status: AC
  Administered 2019-07-05: 14:00:00 250 mg via INTRAMUSCULAR

## 2019-07-05 MED ORDER — AZITHROMYCIN 500 MG PO TABS
1000.0000 mg | ORAL_TABLET | Freq: Once | ORAL | Status: AC
  Administered 2019-07-05: 14:00:00 1000 mg via ORAL

## 2019-07-05 NOTE — Addendum Note (Signed)
Addended by: Sandria Manly on: 07/05/2019 02:42 PM   Modules accepted: Orders

## 2019-07-05 NOTE — ED Triage Notes (Signed)
Pt here for positive gonorrhea treatment.

## 2019-07-05 NOTE — Telephone Encounter (Signed)
Gonorrhea is positive.  Patient should return as soon as possible to the urgent care for treatment with IM rocephin 250mg  and po zithromax 1g. Patient will not need to see a provider unless there are new symptoms she would like evaluated. Pt needs education to refrain from sexual intercourse for now and for 7 days after treatment to give the medicine time to work. Sexual partners need to be notified and tested/treated. Condoms may reduce risk of reinfection. [facility] notified.   Bacterial vaginosis is positive. This was not treated at the urgent care visit.  Flagyl 500 mg BID x 7 days #14 no refills sent to patients pharmacy of choice.    Patient contacted and made aware of    results. Pt verbalized understanding and had all questions answered. Will return today for treatment.

## 2019-08-08 ENCOUNTER — Ambulatory Visit
Admission: EM | Admit: 2019-08-08 | Discharge: 2019-08-08 | Disposition: A | Discharge status: Home / Self Care | Payer: Medicaid Other

## 2019-09-05 ENCOUNTER — Encounter: Payer: Self-pay | Admitting: Emergency Medicine

## 2019-09-05 ENCOUNTER — Other Ambulatory Visit: Payer: Self-pay

## 2019-09-05 ENCOUNTER — Ambulatory Visit
Admission: EM | Admit: 2019-09-05 | Discharge: 2019-09-05 | Disposition: A | Discharge status: Home / Self Care | Payer: Medicaid Other | Attending: Emergency Medicine | Admitting: Emergency Medicine

## 2019-09-05 DIAGNOSIS — Z114 Encounter for screening for human immunodeficiency virus [HIV]: Secondary | ICD-10-CM | POA: Diagnosis present

## 2019-09-05 DIAGNOSIS — Z113 Encounter for screening for infections with a predominantly sexual mode of transmission: Secondary | ICD-10-CM

## 2019-09-05 DIAGNOSIS — Z7251 High risk heterosexual behavior: Secondary | ICD-10-CM

## 2019-09-05 NOTE — ED Triage Notes (Signed)
Pt presents to [facility] after sleeping with the same person who gave her gonorrhea in November.  States he lied to her and told her he had been treated.  Patient denies symptoms at this time, but states she would like "to be tested for everything".

## 2019-09-05 NOTE — ED Notes (Signed)
Patient able to ambulate independently  

## 2019-09-05 NOTE — Discharge Instructions (Addendum)

## 2019-09-05 NOTE — ED Provider Notes (Signed)
[facility]    CSN: [id number] Arrival date & time: 09/05/19  1555      History   Chief Complaint Chief Complaint  Patient presents with  . APPT: 1600  . STI Test    HPI Karen Booth is a 24 y.o. female presenting for STD testing.  Patient was seen in November 2020 initially: Tested positive for gonorrhea and received treatment on 07/05/2019 which she tolerated well.  Patient admits to having intercourse with same partner who gave her gonorrhea about 1 month ago: Has been without signs/symptoms, though states that he told her "you never went and got treated".  Patient stating "I want everything checked ".  No change in urination or bowel habit, fever, abdominal or pelvic pain, back pain, vaginal discharge.  Past Medical History:  Diagnosis Date  . Anxiety    never discussed or been treated, 'always had it, been coming up lately"  . Chlamydia   . Heart murmur   . Hypertension   . Infection    UTI  . Urinary tract infection     Patient Active Problem List   Diagnosis Date Noted  . Chronic hypertension 12/07/2018  . 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 MDD (major depressive disorder), single episode, severe , no psychosis ([facility])     Past Surgical History:  Procedure Laterality Date  . ADENOIDECTOMY    . TONSILLECTOMY      OB History    Gravida  2   Para  2   Term  1   Preterm  1   AB      Living  1     SAB      TAB      Ectopic      Multiple      Live Births  1            Home Medications    Prior to Admission medications   Medication Sig Start Date End Date Taking? Authorizing Provider  etonogestrel (NEXPLANON) 68 MG IMPL implant 1 each by Subdermal route once. Placed 11/2018    [provider]  labetalol (NORMODYNE) 300 MG tablet Take 2 tablets (600 mg total) by mouth 2 (two) times daily. Patient not taking: Reported on 02/22/2019 12/07/18 06/30/19  Marylene Land, CNM  lisinopril (ZESTRIL) 20 MG tablet Take  1 tablet (20 mg total) by mouth 2 (two) times a day. Patient not taking: Reported on 02/22/2019 12/07/18 06/30/19  Marylene Land, CNM  NIFEdipine (PROCARDIA XL) 60 MG 24 hr tablet Take 1 tablet (60 mg total) by mouth daily. Patient not taking: Reported on 12/29/2018 12/21/18 06/30/19  Bing Neighbors, FNP  sertraline (ZOLOFT) 50 MG tablet Take 1 tablet (50 mg total) by mouth daily. Patient not taking: Reported on 02/22/2019 11/13/18 06/30/19  Adam Phenix, MD    Family History Family History  Problem Relation Age of Onset  . Heart disease Mother   . Hypertension Mother   . Heart murmur Mother   . Arrhythmia Mother   . Hypertension Father   . Breast cancer Maternal Grandmother   . Alzheimer's disease Maternal Grandmother   . Breast cancer Paternal Grandmother   . Esophageal cancer Maternal Uncle   . Diabetes Paternal Aunt   . Stomach cancer Maternal Uncle   . Heart disease Maternal Grandfather   . Hypertension Maternal Grandfather   . Stroke Maternal Grandfather   . Breast cancer Maternal Great-grandmother   . Hyperlipidemia Maternal Great-grandfather   . Stroke  Maternal Great-grandfather   . Hearing loss Neg Hx     Social History Social History   Tobacco Use  . Smoking status: Former Smoker    Types: Cigarettes  . Smokeless tobacco: Never Used  . Tobacco comment: quit  before preg  Substance Use Topics  . Alcohol use: No  . Drug use: Not Currently    Types: Marijuana    Comment: last use 30 May 2018     Allergies   Ibuprofen   Review of Systems As per HPI   Physical Exam Triage Vital Signs ED Triage Vitals  Enc Vitals Group     BP      Pulse      Resp      Temp      Temp src      SpO2      Weight      Height      Head Circumference      Peak Flow      Pain Score      Pain Loc      Pain Edu?      Excl. in GC?    No data found.  Updated Vital Signs BP (!) 140/97 (BP Location: Left Arm)   Pulse (!) 112   Temp 97.9 F (36.6 C)  (Temporal)   Resp 16   SpO2 98%   Visual Acuity Right Eye Distance:   Left Eye Distance:   Bilateral Distance:    Right Eye Near:   Left Eye Near:    Bilateral Near:     Physical Exam Constitutional:      General: She is not in acute distress. HENT:     Head: Normocephalic and atraumatic.  Eyes:     General: No scleral icterus.    Pupils: Pupils are equal, round, and reactive to light.  Cardiovascular:     Rate and Rhythm: Normal rate.  Pulmonary:     Effort: Pulmonary effort is normal.  Abdominal:     General: Bowel sounds are normal.     Palpations: Abdomen is soft.     Tenderness: There is no abdominal tenderness. There is no right CVA tenderness, left CVA tenderness or guarding.  Genitourinary:    Comments: Patient declined, self-swab performed Skin:    Coloration: Skin is not jaundiced or pale.  Neurological:     Mental Status: She is alert and oriented to person, place, and time.      UC Treatments / Results  Labs (all labs ordered are listed, but only abnormal results are displayed) Labs Reviewed  HIV ANTIBODY (ROUTINE TESTING W REFLEX)  RPR  CERVICOVAGINAL ANCILLARY ONLY    EKG   Radiology No results found.  Procedures Procedures (including critical care time)  Medications Ordered in UC Medications - No data to display  Initial Impression / Assessment and Plan / UC Course  I have reviewed the triage vital signs and the nursing notes.  Pertinent labs & imaging results that were available during my care of the patient were reviewed by me and considered in my medical decision making (see chart for details).     STD pending as outlined above.  Patient appears to be unsure of validity of sex partners claims: Electing to wait till results are back prior to treatment.  I am okay with this as patient is asymptomatic, without fever.  Return precautions discussed, patient verbalized understanding and is agreeable to plan. Final Clinical Impressions(s)  / UC Diagnoses   Final  diagnoses:  Screening for human immunodeficiency virus  Screening examination for venereal disease  Unprotected sex     Discharge Instructions     Testing for chlamydia, gonorrhea, trichomonas is pending: please look for these results on the MyChart app/website.  We will notify you if you are positive and outline treatment at that time.  Important to avoid all forms of sexual intercourse (oral, vaginal, anal) with any/all partners for the next 7 days to avoid spreading/reinfecting. Any/all sexual partners should be notified of testing/treatment today.  Return for persistent/worsening symptoms or if you develop fever, abdominal or pelvic pain, blood in your urine, or are re-exposed to an STI.    ED Prescriptions    None     PDMP not reviewed this encounter.   [place], [place], [place] 09/05/19 1821

## 2019-09-06 LAB — HIV ANTIBODY (ROUTINE TESTING W REFLEX): HIV Screen 4th Generation wRfx: NONREACTIVE

## 2019-09-06 LAB — RPR: RPR Ser Ql: NONREACTIVE

## 2019-09-07 LAB — CERVICOVAGINAL ANCILLARY ONLY
Chlamydia: NEGATIVE
Neisseria Gonorrhea: POSITIVE — AB
Trichomonas: NEGATIVE

## 2019-09-08 ENCOUNTER — Ambulatory Visit
Admission: EM | Admit: 2019-09-08 | Discharge: 2019-09-08 | Disposition: A | Discharge status: Home / Self Care | Payer: Medicaid Other | Attending: Physician Assistant | Admitting: Physician Assistant

## 2019-09-08 DIAGNOSIS — Z202 Contact with and (suspected) exposure to infections with a predominantly sexual mode of transmission: Secondary | ICD-10-CM

## 2019-09-08 MED ORDER — CEFTRIAXONE SODIUM 500 MG IJ SOLR
500.0000 mg | Freq: Once | INTRAMUSCULAR | Status: AC
  Administered 2019-09-08: 16:00:00 500 mg via INTRAMUSCULAR

## 2019-09-08 NOTE — ED Triage Notes (Signed)
Pt states she is positive for Gonorrhea. Requesting "a shot" for  treatment.

## 2019-12-07 ENCOUNTER — Encounter: Payer: Self-pay | Admitting: Emergency Medicine

## 2019-12-07 ENCOUNTER — Other Ambulatory Visit: Payer: Self-pay

## 2019-12-07 ENCOUNTER — Ambulatory Visit
Admission: EM | Admit: 2019-12-07 | Discharge: 2019-12-07 | Disposition: A | Discharge status: Home / Self Care | Payer: Medicaid Other | Attending: Emergency Medicine | Admitting: Emergency Medicine

## 2019-12-07 ENCOUNTER — Ambulatory Visit (INDEPENDENT_AMBULATORY_CARE_PROVIDER_SITE_OTHER): Payer: Medicaid Other

## 2019-12-07 DIAGNOSIS — R05 Cough: Secondary | ICD-10-CM

## 2019-12-07 DIAGNOSIS — R059 Cough, unspecified: Secondary | ICD-10-CM

## 2019-12-07 MED ORDER — PREDNISONE 20 MG PO TABS: 20.0000 mg | ORAL_TABLET | Freq: Every day | ORAL | 0 refills | Status: DC

## 2019-12-07 MED ORDER — FLUTICASONE PROPIONATE 50 MCG/ACT NA SUSP: 1.0000 | Freq: Every day | NASAL | 0 refills | Status: DC

## 2019-12-07 MED ORDER — BENZONATATE 100 MG PO CAPS: 100.0000 mg | ORAL_CAPSULE | Freq: Three times a day (TID) | ORAL | 0 refills | Status: DC

## 2019-12-07 MED ORDER — LORATADINE 10 MG PO TABS: 10.0000 mg | ORAL_TABLET | Freq: Every day | ORAL | 0 refills | Status: DC

## 2019-12-07 NOTE — ED Provider Notes (Signed)
[facility]    CSN: [id number] Arrival date & time: 12/07/19  1803      History   Chief Complaint Chief Complaint  Patient presents with  . Cough    HPI Karen Booth is a 24 y.o. female with history of hypertension presenting for 1 week course of worsening cough.  States is been ongoing for the last 2 to 3 weeks.  States cough is nonproductive, without hemoptysis, chest pain, difficulty breathing.  No fever, lower leg swelling.  Denies recent change in medication, known sick contacts.  Has been taking Allegra, cough drops without relief.  LMP last week: Last coitus a few days ago.  Has Nexplanon in place.  Has not received Covid vaccines.  Does work in Teacher, music.   Past Medical History:  Diagnosis Date  . Anxiety    never discussed or been treated, 'always had it, been coming up lately"  . Chlamydia   . Heart murmur   . Hypertension   . Infection    UTI  . Urinary tract infection     Patient Active Problem List   Diagnosis Date Noted  . Chronic hypertension 12/07/2018  . 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 MDD (major depressive disorder), single episode, severe , no psychosis (HCC)     Past Surgical History:  Procedure Laterality Date  . ADENOIDECTOMY    . TONSILLECTOMY      OB History    Gravida  2   Para  2   Term  1   Preterm  1   AB      Living  1     SAB      TAB      Ectopic      Multiple      Live Births  1            Home Medications    Prior to Admission medications   Medication Sig Start Date End Date Taking? Authorizing Provider  benzonatate (TESSALON) 100 MG capsule Take 1 capsule (100 mg total) by mouth every 8 (eight) hours. 12/07/19   Hall-Potvin, [place], PA-C  etonogestrel (NEXPLANON) 68 MG IMPL implant 1 each by Subdermal route once. Placed 11/2018    [provider]  fluticasone (FLONASE) 50 MCG/ACT nasal spray Place 1 spray into both nostrils daily. 12/07/19   Hall-Potvin, [place], PA-C   loratadine (CLARITIN) 10 MG tablet Take 1 tablet (10 mg total) by mouth daily. 12/07/19   Hall-Potvin, [place], PA-C  predniSONE (DELTASONE) 20 MG tablet Take 1 tablet (20 mg total) by mouth daily. 12/07/19   Hall-Potvin, [place], PA-C  labetalol (NORMODYNE) 300 MG tablet Take 2 tablets (600 mg total) by mouth 2 (two) times daily. Patient not taking: Reported on 02/22/2019 12/07/18 06/30/19  Marylene Land, CNM  lisinopril (ZESTRIL) 20 MG tablet Take 1 tablet (20 mg total) by mouth 2 (two) times a day. Patient not taking: Reported on 02/22/2019 12/07/18 06/30/19  Marylene Land, CNM  NIFEdipine (PROCARDIA XL) 60 MG 24 hr tablet Take 1 tablet (60 mg total) by mouth daily. Patient not taking: Reported on 12/29/2018 12/21/18 06/30/19  Bing Neighbors, FNP  sertraline (ZOLOFT) 50 MG tablet Take 1 tablet (50 mg total) by mouth daily. Patient not taking: Reported on 02/22/2019 11/13/18 06/30/19  Adam Phenix, MD    Family History Family History  Problem Relation Age of Onset  . Heart disease Mother   . Hypertension Mother   . Heart murmur Mother   .  Arrhythmia Mother   . Hypertension Father   . Breast cancer Maternal Grandmother   . Alzheimer's disease Maternal Grandmother   . Breast cancer Paternal Grandmother   . Esophageal cancer Maternal Uncle   . Diabetes Paternal Aunt   . Stomach cancer Maternal Uncle   . Heart disease Maternal Grandfather   . Hypertension Maternal Grandfather   . Stroke Maternal Grandfather   . Breast cancer Maternal Great-grandmother   . Hyperlipidemia Maternal Great-grandfather   . Stroke Maternal Great-grandfather   . Hearing loss Neg Hx     Social History Social History   Tobacco Use  . Smoking status: Former Smoker    Types: Cigarettes  . Smokeless tobacco: Never Used  . Tobacco comment: quit  before preg  Substance Use Topics  . Alcohol use: No  . Drug use: Not Currently    Types: Marijuana    Comment: last use 30 May 2018      Allergies   Ibuprofen   Review of Systems As per HPI   Physical Exam Triage Vital Signs ED Triage Vitals  Enc Vitals Group     BP 12/07/19 1812 (!) 149/91     Pulse Rate 12/07/19 1812 (!) 105     Resp 12/07/19 1812 18     Temp 12/07/19 1812 99.7 F (37.6 C)     Temp Source 12/07/19 1812 Oral     SpO2 12/07/19 1812 97 %     Weight --      Height --      Head Circumference --      Peak Flow --      Pain Score 12/07/19 1816 0     Pain Loc --      Pain Edu? --      Excl. in [place]? --    No data found.  Updated Vital Signs BP (!) 149/91 (BP Location: Left Arm)   Pulse (!) 105   Temp 99.7 F (37.6 C) (Oral)   Resp 18   SpO2 97%   Visual Acuity Right Eye Distance:   Left Eye Distance:   Bilateral Distance:    Right Eye Near:   Left Eye Near:    Bilateral Near:     Physical Exam Constitutional:      General: She is not in acute distress.    Appearance: She is obese. She is not ill-appearing or diaphoretic.  HENT:     Head: Normocephalic and atraumatic.     Mouth/Throat:     Mouth: Mucous membranes are moist.     Pharynx: Oropharynx is clear. No oropharyngeal exudate or posterior oropharyngeal erythema.  Eyes:     General: No scleral icterus.    Conjunctiva/sclera: Conjunctivae normal.     Pupils: Pupils are equal, round, and reactive to light.  Neck:     Comments: Trachea midline, negative JVD Cardiovascular:     Rate and Rhythm: Normal rate and regular rhythm.     Heart sounds: No murmur. No gallop.   Pulmonary:     Effort: Pulmonary effort is normal. No respiratory distress.     Breath sounds: No wheezing, rhonchi or rales.  Musculoskeletal:     Cervical back: Neck supple. No tenderness.  Lymphadenopathy:     Cervical: No cervical adenopathy.  Skin:    Capillary Refill: Capillary refill takes less than 2 seconds.     Coloration: Skin is not jaundiced or pale.     Findings: No rash.  Neurological:     General:  No focal deficit present.      Mental Status: She is alert and oriented to person, place, and time.      UC Treatments / Results  Labs (all labs ordered are listed, but only abnormal results are displayed) Labs Reviewed  NOVEL CORONAVIRUS, NAA    EKG   Radiology DG Chest 2 View  Result Date: 12/07/2019 CLINICAL DATA:  Cough EXAM: CHEST - 2 VIEW COMPARISON:  02/22/2019 FINDINGS: The heart size and mediastinal contours are within normal limits. Both lungs are clear. The visualized skeletal structures are unremarkable. IMPRESSION: No active cardiopulmonary disease. Electronically Signed   By: Jasmine Pang M.D.   On: 12/07/2019 19:32    Procedures Procedures (including critical care time)  Medications Ordered in UC Medications - No data to display  Initial Impression / Assessment and Plan / UC Course  I have reviewed the triage vital signs and the nursing notes.  Pertinent labs & imaging results that were available during my care of the patient were reviewed by me and considered in my medical decision making (see chart for details).     Patient afebrile, nontoxic, with SpO2 97%.  Given chronicity of cough, x-ray obtained in office.  This was reviewed by me and radiology: Negative for active cardiopulmonary disease.  Covid PCR pending.  Patient to quarantine until results are back.  We will treat supportively as outlined below.  Return precautions discussed, patient verbalized understanding and is agreeable to plan. Final Clinical Impressions(s) / UC Diagnoses   Final diagnoses:  Cough     Discharge Instructions     Your COVID test is pending - it is important to quarantine / isolate at home until your results are back. If you test positive and would like further evaluation for persistent or worsening symptoms, you may schedule an E-visit or virtual (video) visit throughout the [facility] app or website.  PLEASE NOTE: If you develop severe chest pain or shortness of breath please go to the ER or  call 9-1-1 for further evaluation --> DO NOT schedule electronic or virtual visits for this. Please call our office for further guidance / recommendations as needed.  For information about the Covid vaccine, please visit [email or web address]    ED Prescriptions    Medication Sig Dispense Auth. Provider   benzonatate (TESSALON) 100 MG capsule Take 1 capsule (100 mg total) by mouth every 8 (eight) hours. 21 capsule Hall-Potvin, [place], PA-C   loratadine (CLARITIN) 10 MG tablet Take 1 tablet (10 mg total) by mouth daily. 30 tablet Hall-Potvin, [place], PA-C   fluticasone (FLONASE) 50 MCG/ACT nasal spray Place 1 spray into both nostrils daily. 16 g Hall-Potvin, [place], PA-C   predniSONE (DELTASONE) 20 MG tablet Take 1 tablet (20 mg total) by mouth daily. 5 tablet Hall-Potvin, [place], PA-C     PDMP not reviewed this encounter.   Hall-Potvin, [place], [place] 12/07/19 1955

## 2019-12-07 NOTE — Discharge Instructions (Signed)
Your COVID test is pending - it is important to quarantine / isolate at home until your results are back. °If you test positive and would like further evaluation for persistent or worsening symptoms, you may schedule an E-visit or virtual (video) visit throughout the [facility] MyChart app or website. ° °PLEASE NOTE: If you develop severe chest pain or shortness of breath please go to the ER or call 9-1-1 for further evaluation --> DO NOT schedule electronic or virtual visits for this. °Please call our office for further guidance / recommendations as needed. ° °For information about the Covid vaccine, please visit [email or web address] °

## 2019-12-07 NOTE — ED Triage Notes (Signed)
Symptoms started 2-3 weeks ago, cough has gradually worsened.  Patient complains of sore throat today and sneezing Patient checked temperature at work, 99.0.

## 2019-12-09 LAB — NOVEL CORONAVIRUS, NAA: SARS-CoV-2, NAA: NOT DETECTED

## 2019-12-09 LAB — SARS-COV-2, NAA 2 DAY TAT

## 2020-08-04 IMAGING — US US MFM OB FOLLOW-UP
1 series · 14 of 28 positions shown · non-contrast
Comparison: none

[Series 1: us mfm ob follow-up · 14 of 43 slices shown]
[im 2/43]
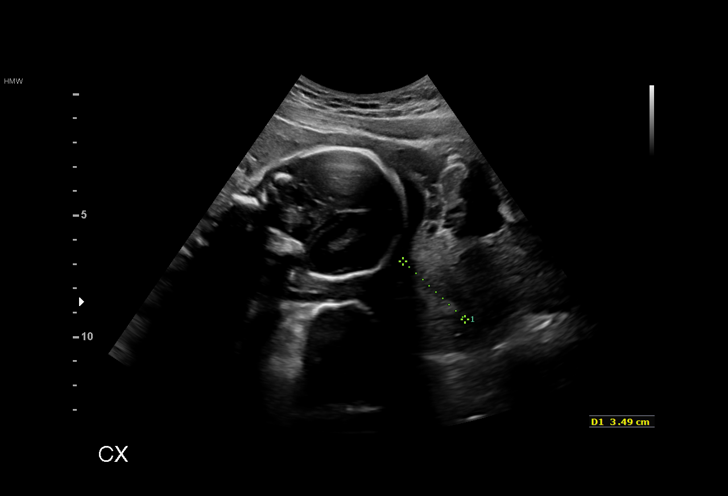
[im 5/43]
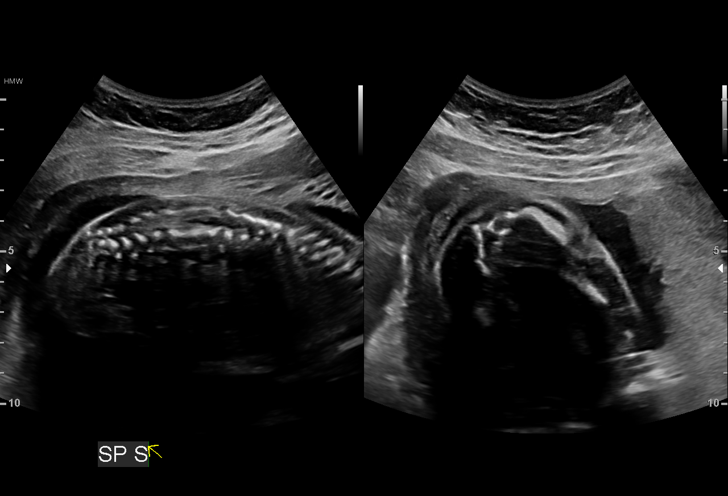
[im 8/43]
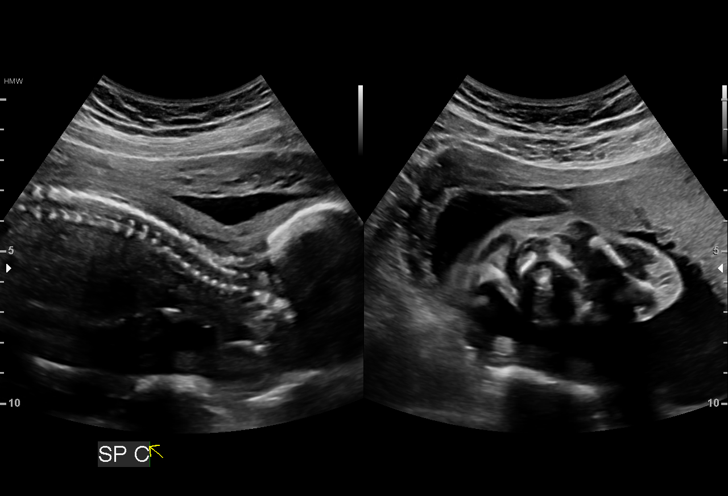
[im 11/43]
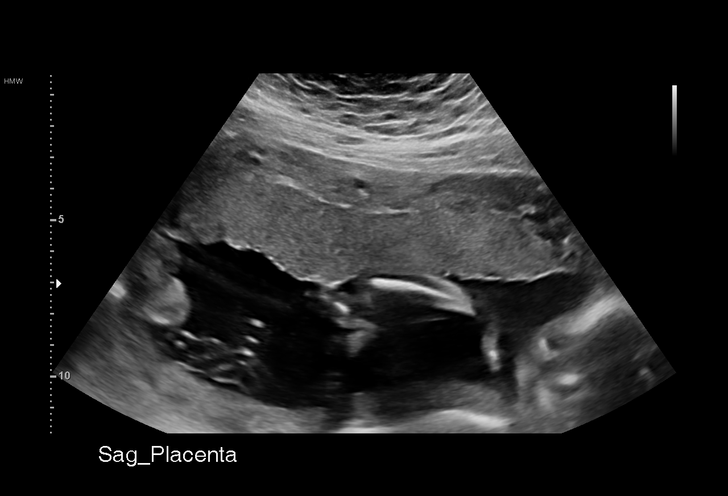
[im 15/43]
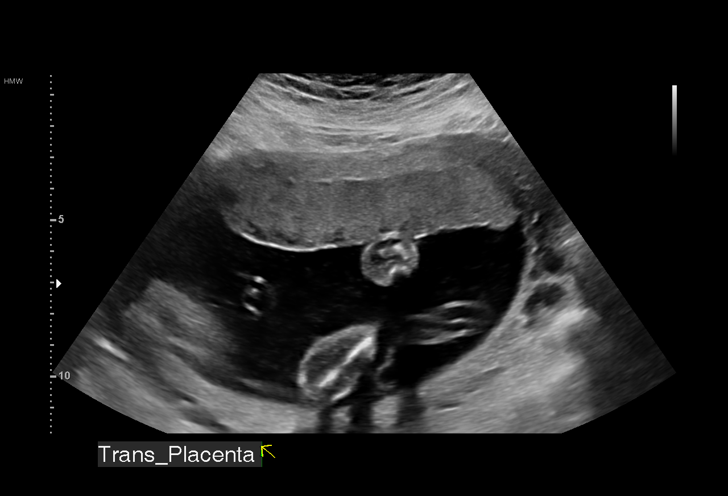
[im 18/43]
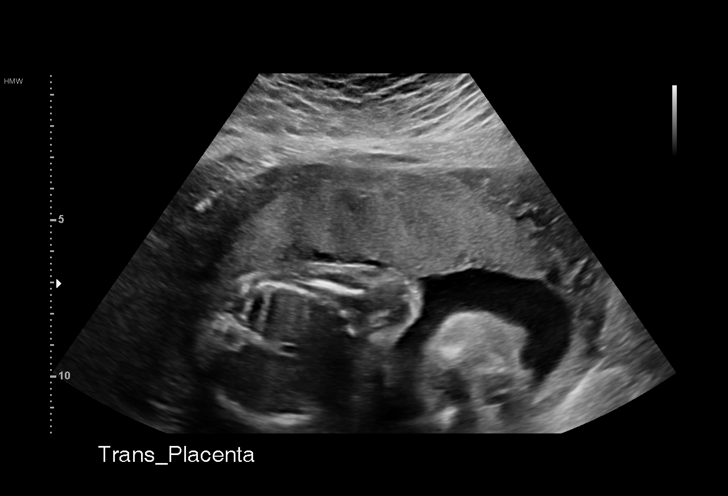
[im 21/43]
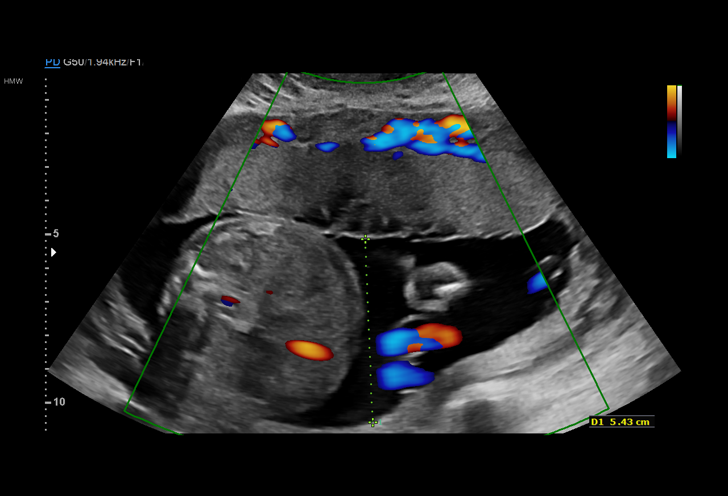
[im 24/43]
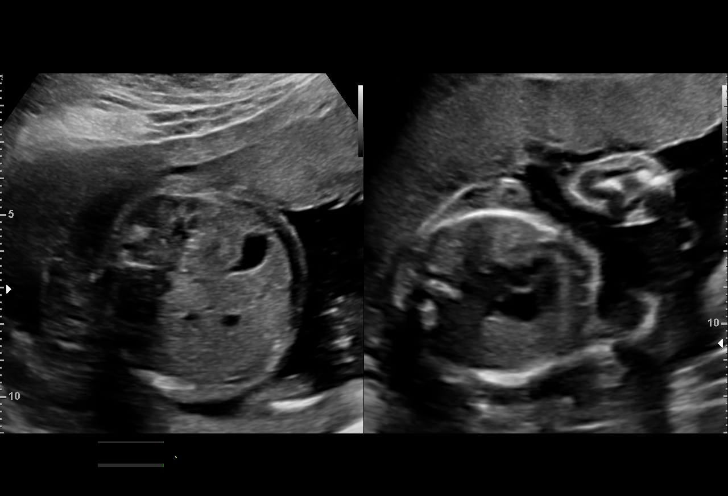
[im 27/43]
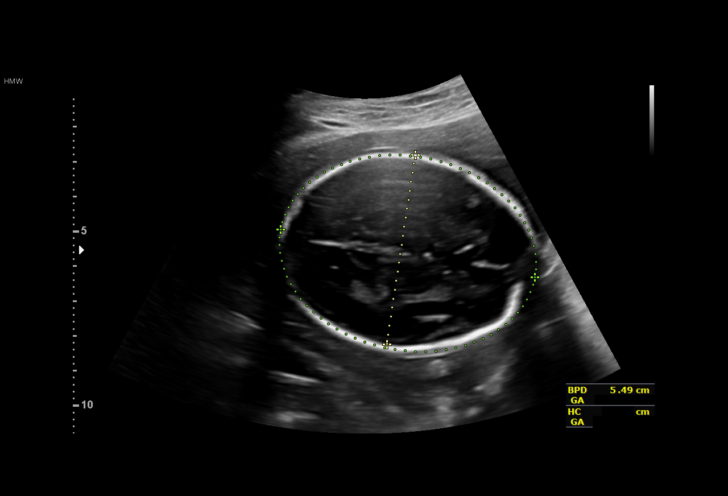
[im 30/43]
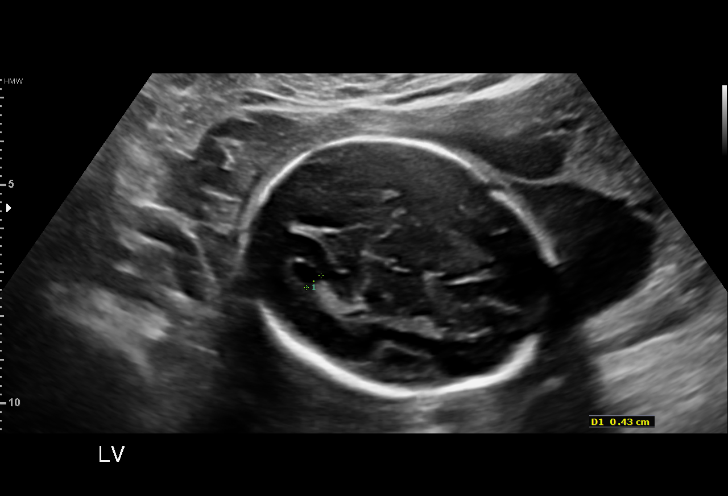
[im 33/43]
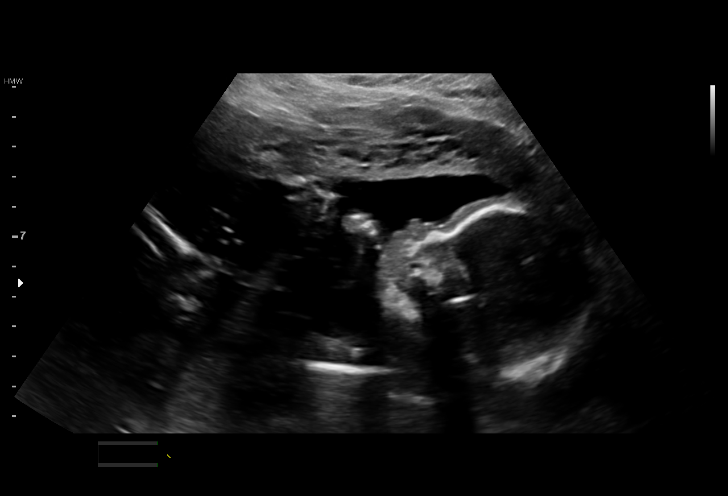
[im 36/43]
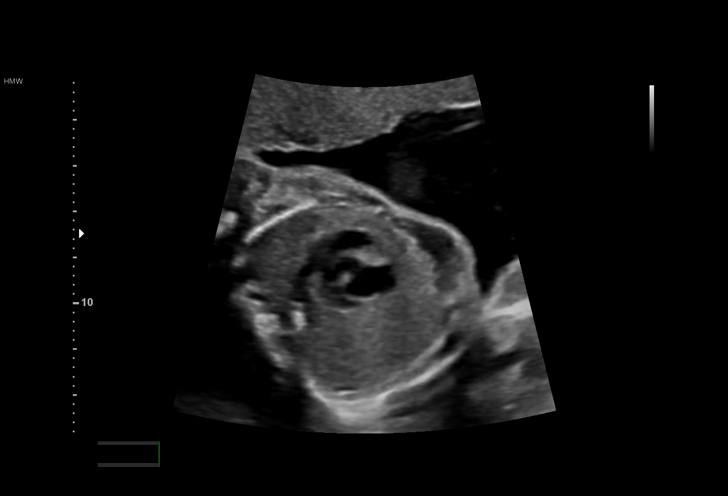
[im 39/43]
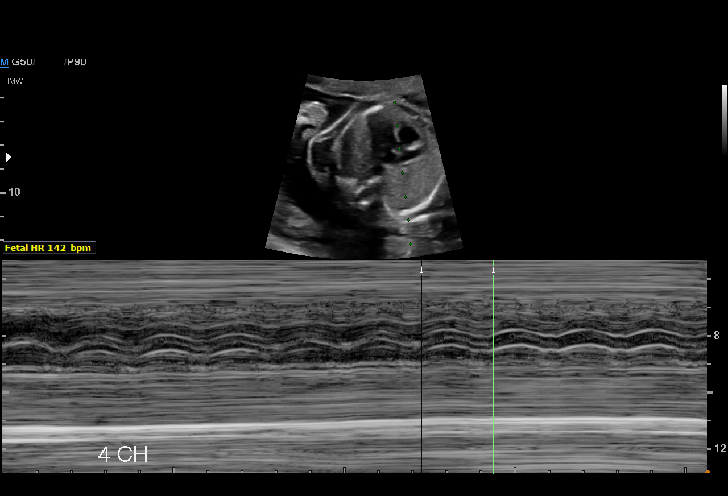
[im 43/43]
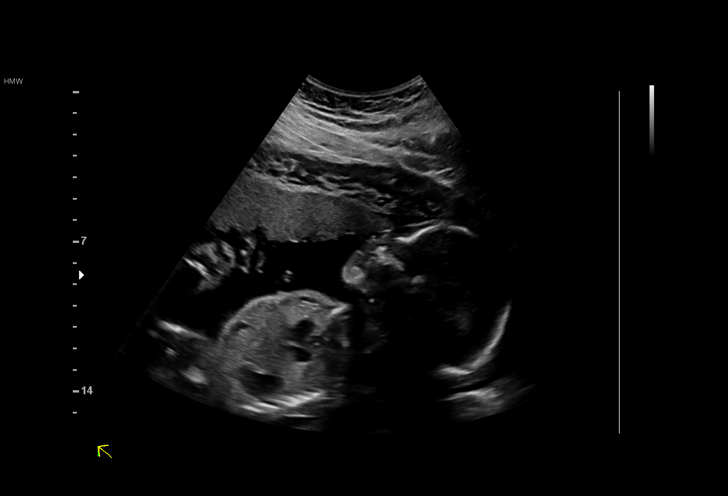

[14 of 28 positions shown; findings below may reference images not displayed]

OG
 ----------------------------------------------------------------------

 ----------------------------------------------------------------------
Indications

  23 weeks gestation of pregnancy
  Encounter for other antenatal screening
  follow-up
 ----------------------------------------------------------------------
Fetal Evaluation

 Num Of Fetuses:          1
 Fetal Heart              142
 Rate(bpm):
 Cardiac Activity:        Observed
 Presentation:            Cephalic
 Placenta:                Anterior
 P. Cord Insertion:       Visualized, central

 Amniotic Fluid
 AFI FV:      Within normal limits

                             Largest Pocket(cm)

Biometry

 BPD:      55.3  mm     G. Age:  22w 6d         20  %    CI:         70.54  %    70 - 86
                                                         FL/HC:       19.4  %    18.7 -
 HC:      209.9  mm     G. Age:  23w 1d         18  %    HC/AC:       1.10       1.05 -
 AC:      190.9  mm     G. Age:  23w 6d         50  %    FL/BPD:      73.8  %    71 - 87
 FL:       40.8  mm     G. Age:  23w 2d         27  %    FL/AC:       21.4  %    20 - 24
 HUM:      42.1  mm     G. Age:  25w 2d         83  %

 Est. FW:     598   g      1 lb 5 oz     51  %
                    m
OB History

 Gravidity:    2         Term:   1
 Living:       1
Gestational Age

 LMP:           23w 4d        Date:  03/01/18                 EDD:    12/06/18
 U/S Today:     23w 2d                                        EDD:    12/08/18
 Best:          23w 4d     Det. By:  LMP  (03/01/18)          EDD:    12/06/18
Anatomy

 Cranium:               Appears normal         Aortic Arch:            Previously seen
 Cavum:                 Previously seen        Ductal Arch:            Previously seen
 Ventricles:            Appears normal         Diaphragm:              Previously seen
 Choroid Plexus:        Previously seen        Stomach:                Appears normal,
                                                                       left sided
 Cerebellum:            Previously seen        Abdomen:                Previously seen
 Posterior Fossa:       Previously seen        Abdominal Wall:         Previously seen
 Nuchal Fold:           Previously seen        Cord Vessels:           Previously seen
 Face:                  Orbits and profile     Kidneys:                Appear normal
                        previously seen
 Lips:                  Previously seen        Bladder:                Appears normal
 Thoracic:              Appears normal         Spine:                  Appears normal
 Heart:                 Appears normal         Upper Extremities:      Previously seen
                        (4CH, axis, and
                        situs
 RVOT:                  Appears normal         Lower Extremities:      Previously seen
 LVOT:                  Appears normal
Cervix Uterus Adnexa

 Cervix
 Length:            4.4  cm.
 Normal appearance by transabdominal scan.

 Uterus
 No abnormality visualized.

 Left Ovary
 No adnexal mass visualized.

 Right Ovary
 No adnexal mass visualized.

 Cul De Sac
 No free fluid seen.

 Adnexa
 No abnormality visualized.
Impression

 Normal interval growth.
Recommendations

 Follow up as clinically indicated.

## 2020-10-18 IMAGING — US US MFM FETAL BPP WO NON STRESS
1 series · 14 of 28 positions shown · non-contrast
Comparison: none

[Series 1: us mfm fetal bpp wo non stress · 49 acquisitions, 14 frames shown]
[im 2/49]
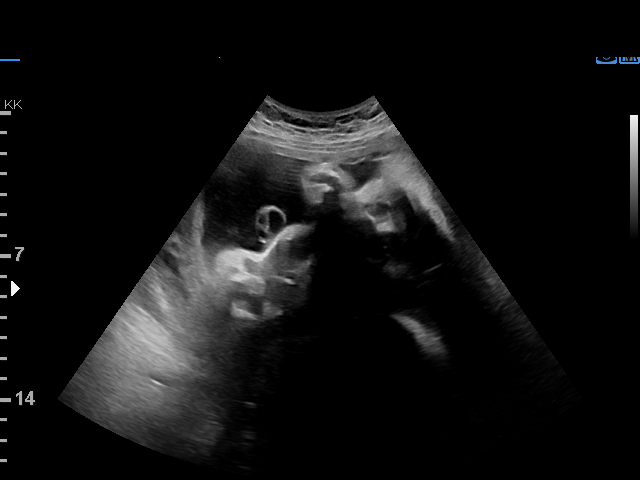
[im 6/49]
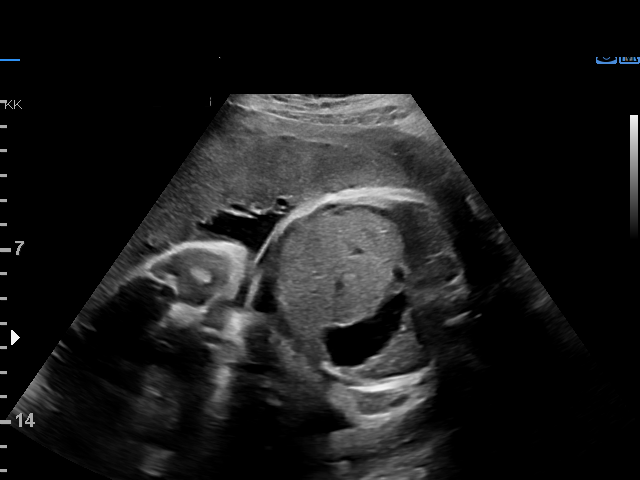
[im 9/49]
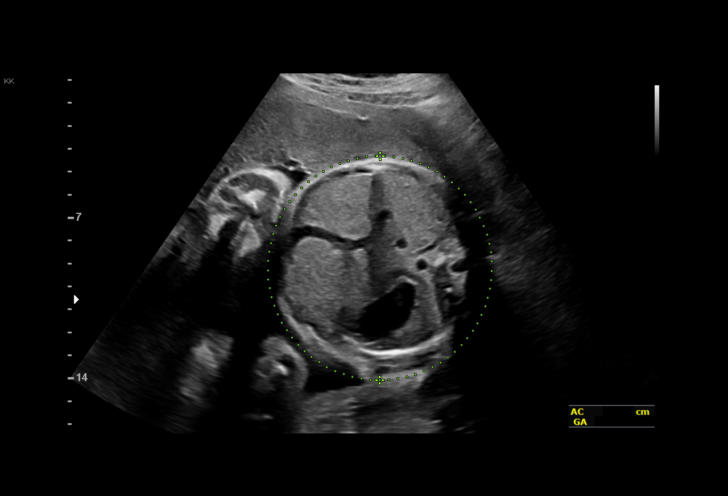
[im 13/49]
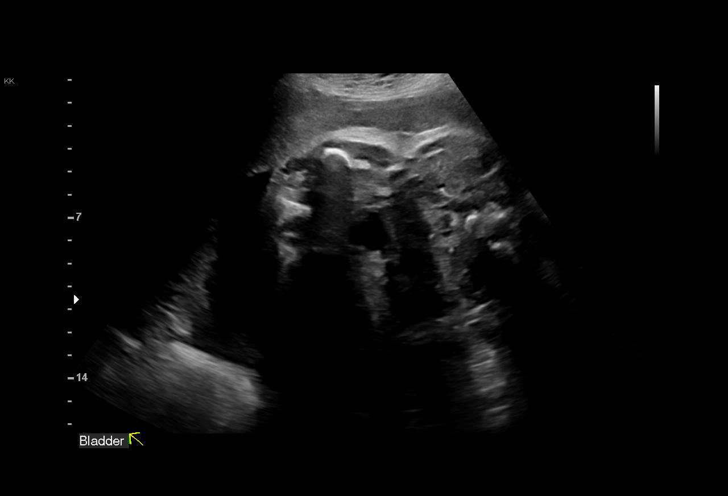
[im 17/49]
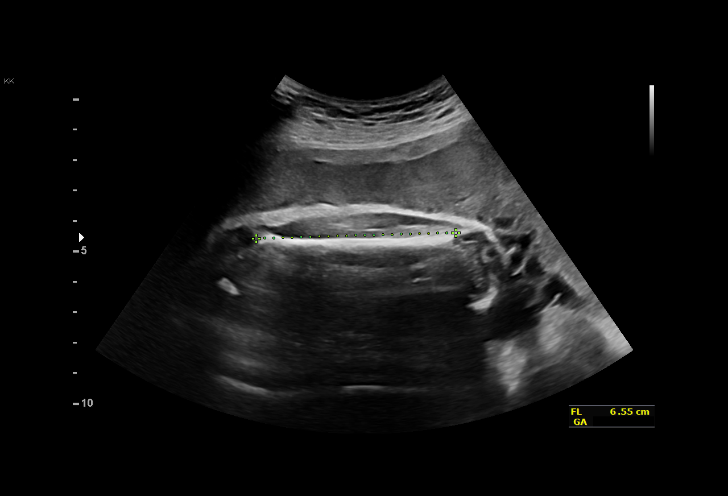
[im 20/49]
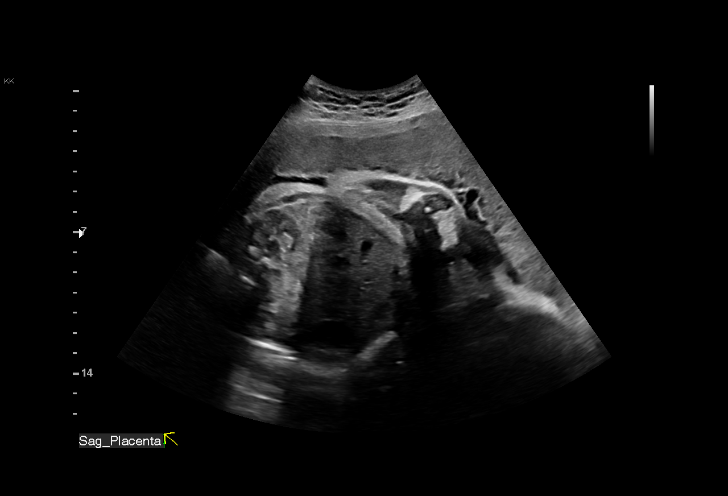
[im 24/49]
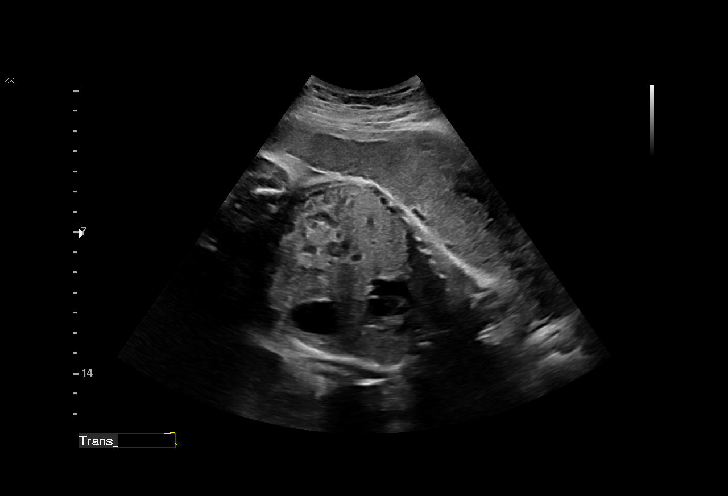
[im 27/49]
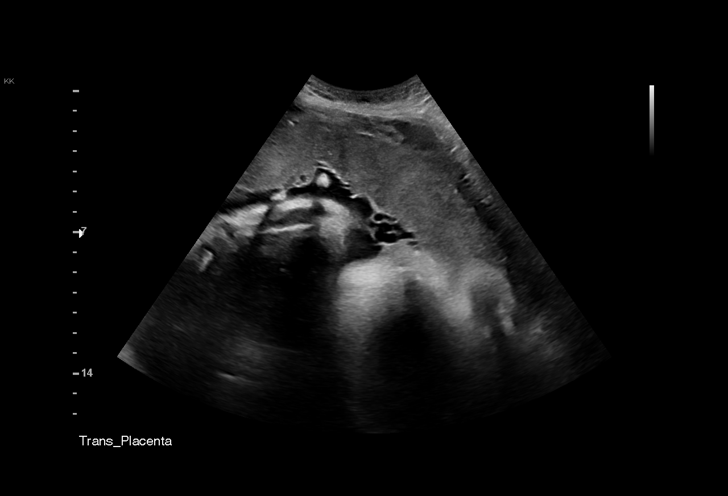
[im 31/49]
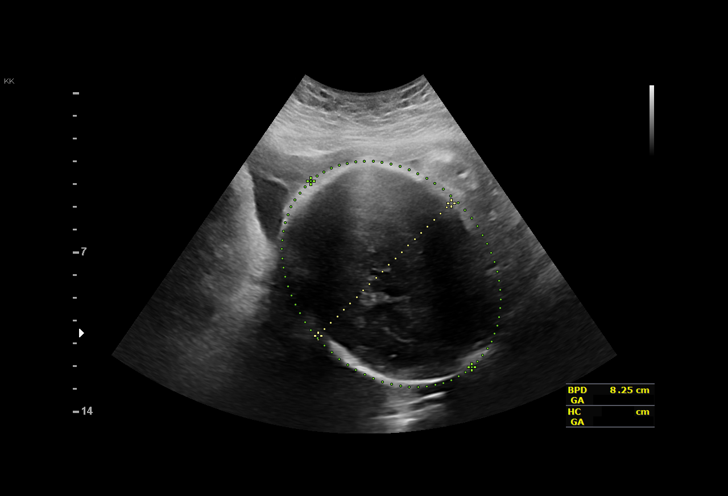
[im 34/49]
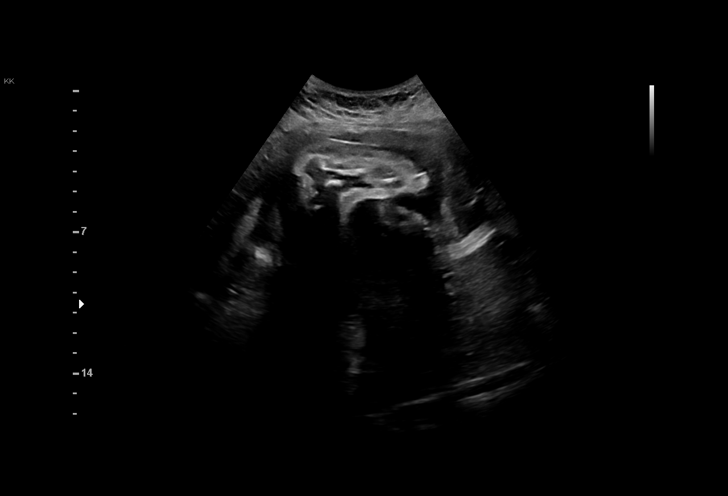
[im 38/49]
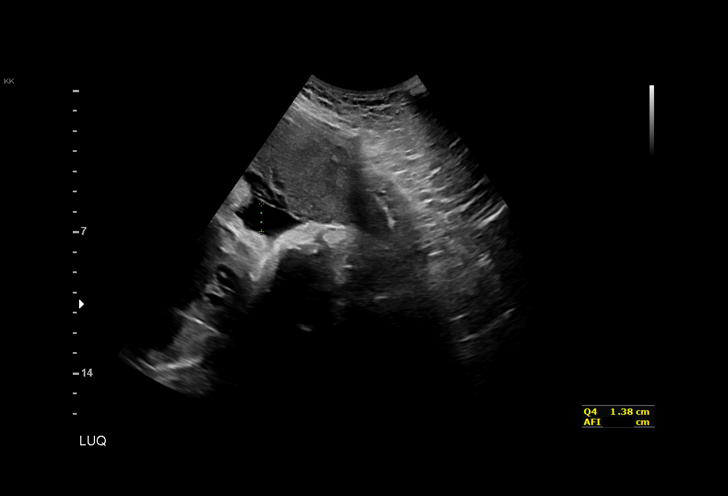
[im 41/49]
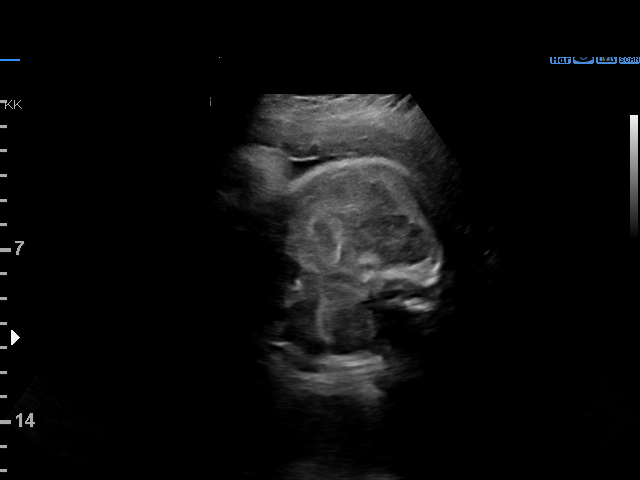
[im 45/49]
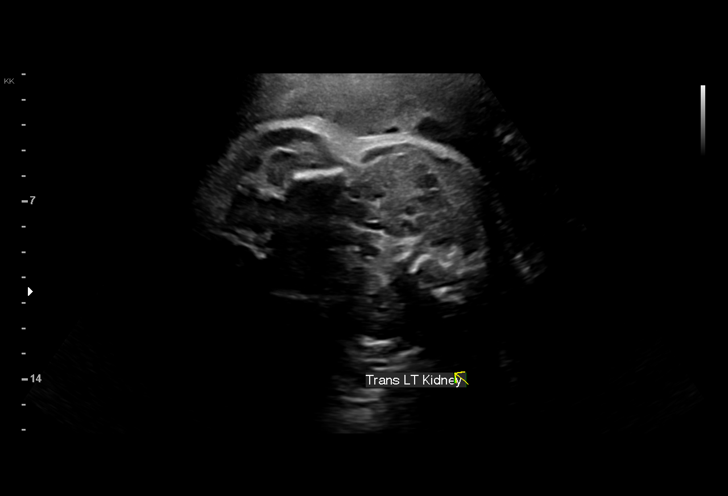
[im 49/49]
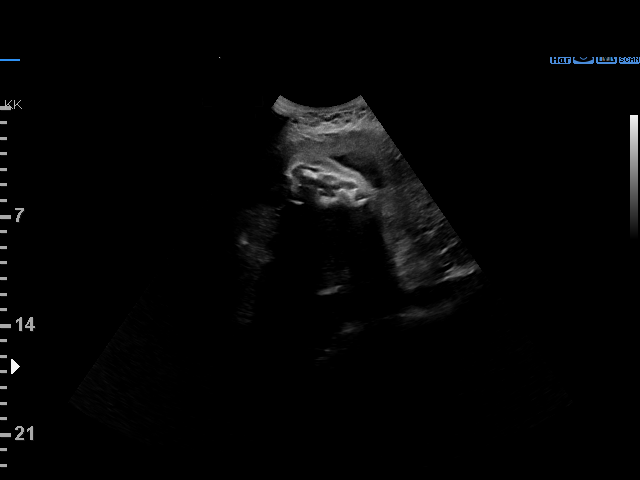

[14 of 28 positions shown; findings below may reference images not displayed]

OB/Gyn Clinic

 ----------------------------------------------------------------------

 ----------------------------------------------------------------------
Indications

  34 weeks gestation of pregnancy
  Encounter for other antenatal screening
  follow-up
  Obesity complicating pregnancy, third
  trimester BMI 34)(Low Risk NIPS)
  Hypertension - Chronic/Pre-existing
 ----------------------------------------------------------------------
Vital Signs

 BMI:
Fetal Evaluation

 Num Of Fetuses:         1
 Fetal Heart Rate(bpm):  130
 Cardiac Activity:       Observed
 Presentation:           Cephalic
 Placenta:               Anterior
 P. Cord Insertion:      Previously Visualized

 Amniotic Fluid
 AFI FV:      Within normal limits
 AFI Sum(cm)     %Tile       Largest Pocket(cm)
 14.43           51

 RUQ(cm)       RLQ(cm)       LUQ(cm)        LLQ(cm)

Biophysical Evaluation

 Amniotic F.V:   Pocket => 2 cm two         F. Tone:        Observed
                 planes
 F. Movement:    Observed                   Score:          [DATE]
 F. Breathing:   Observed
Biometry

 BPD:      82.6  mm     G. Age:  33w 2d         19  %    CI:        73.51   %    70 - 86
                                                         FL/HC:      21.5   %    19.4 -
 HC:      306.1  mm     G. Age:  34w 1d         14  %    HC/AC:      1.00        0.96 -
 AC:       305   mm     G. Age:  34w 4d         59  %    FL/BPD:     79.5   %    71 - 87
 FL:       65.7  mm     G. Age:  33w 6d         29  %    FL/AC:      21.5   %    20 - 24

 Est. FW:    9488  gm      5 lb 3 oz     56  %
OB History

 Gravidity:    2         Term:   1
 Living:       1
Gestational Age

 LMP:           34w 2d        Date:  03/01/18                 EDD:   12/06/18
 U/S Today:     34w 0d                                        EDD:   12/08/18
 Best:          34w 2d     Det. By:  LMP  (03/01/18)          EDD:   12/06/18
Anatomy

 Cranium:               Appears normal         Aortic Arch:            Previously seen
 Cavum:                 Previously seen        Ductal Arch:            Previously seen
 Ventricles:            Previously seen        Diaphragm:              Appears normal
 Choroid Plexus:        Previously seen        Stomach:                Appears normal, left
                                                                       sided
 Cerebellum:            Previously seen        Abdomen:                Previously seen
 Posterior Fossa:       Previously seen        Abdominal Wall:         Previously seen
 Nuchal Fold:           Previously seen        Cord Vessels:           Previously seen
 Face:                  Orbits and profile     Kidneys:                Appear normal
                        previously seen
 Lips:                  Previously seen        Bladder:                Appears normal
 Thoracic:              Appears normal         Spine:                  Previously seen
 Heart:                 Appears normal         Upper Extremities:      Previously seen
                        (4CH, axis, and situs
 RVOT:                  Previously seen        Lower Extremities:      Previously seen
 LVOT:                  Previously seen
Cervix Uterus Adnexa

 Cervix
 Not visualized (advanced GA >98wks)
Impression

 Fetal growth is appropriate for gestational age. Amniotic fluid
 is normal and good fetal activity is seen. Antenatal testing is
 reassuring. BPP [DATE].
Recommendations

 -Continue weekly testing at your office.
 -Fetal growth assessment in 4 weeks.
                 Bascom, Eva Luisa

## 2020-10-22 ENCOUNTER — Encounter (HOSPITAL_COMMUNITY): Payer: Self-pay

## 2020-10-22 ENCOUNTER — Ambulatory Visit (HOSPITAL_COMMUNITY)
Admission: EM | Admit: 2020-10-22 | Discharge: 2020-10-22 | Disposition: A | Discharge status: Home / Self Care | Payer: PRIVATE HEALTH INSURANCE | Attending: Family Medicine | Admitting: Family Medicine

## 2020-10-22 ENCOUNTER — Other Ambulatory Visit: Payer: Self-pay

## 2020-10-22 DIAGNOSIS — Z113 Encounter for screening for infections with a predominantly sexual mode of transmission: Secondary | ICD-10-CM | POA: Insufficient documentation

## 2020-10-22 DIAGNOSIS — R110 Nausea: Secondary | ICD-10-CM | POA: Diagnosis present

## 2020-10-22 DIAGNOSIS — M549 Dorsalgia, unspecified: Secondary | ICD-10-CM

## 2020-10-22 DIAGNOSIS — Z3202 Encounter for pregnancy test, result negative: Secondary | ICD-10-CM

## 2020-10-22 DIAGNOSIS — R1010 Upper abdominal pain, unspecified: Secondary | ICD-10-CM | POA: Insufficient documentation

## 2020-10-22 DIAGNOSIS — R509 Fever, unspecified: Secondary | ICD-10-CM | POA: Insufficient documentation

## 2020-10-22 LAB — POC URINE PREG, ED: Preg Test, Ur: NEGATIVE

## 2020-10-22 LAB — POCT URINALYSIS DIPSTICK, ED / UC
Bilirubin Urine: NEGATIVE
Glucose, UA: NEGATIVE mg/dL
Ketones, ur: 15 mg/dL — AB
Leukocytes,Ua: NEGATIVE
Nitrite: NEGATIVE
Protein, ur: 30 mg/dL — AB
Specific Gravity, Urine: >=1.03 (ref 1.005–1.030)
Urobilinogen, UA: 1 mg/dL (ref 0.0–1.0)
pH: 5.5 (ref 5.0–8.0)

## 2020-10-22 MED ORDER — ONDANSETRON 4 MG PO TBDP: 4.0000 mg | ORAL_TABLET | Freq: Three times a day (TID) | ORAL | 0 refills | Status: DC | PRN

## 2020-10-22 NOTE — ED Provider Notes (Signed)
[facility]    CSN: [id number] Arrival date & time: 10/22/20  1811      History   Chief Complaint Chief Complaint  Patient presents with  . Abdominal Pain  . Back Pain  . Fever    HPI Karen Booth is a 25 y.o. female.   Patient presenting today with 1 day history of upper abdominal pain, fever yesterday, nausea, low back pain bilaterally.  She also noticed some yellow vaginal discharge today.  She denies diarrhea, vomiting, rashes, headache, chest pain, shortness of breath, dizziness, weakness.  Took some Zantac yesterday without any relief.  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  She would like STD screening today but does not have any known exposures.  On implantable contraception.       Past Medical History:  Diagnosis Date  . Anxiety    never discussed or been treated, 'always had it, been coming up lately"  . Chlamydia   . Heart murmur   . Hypertension   . Infection    UTI  . Urinary tract infection     Patient Active Problem List   Diagnosis Date Noted  . Chronic hypertension 12/07/2018  . 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 MDD (major depressive disorder), single episode, severe , no psychosis (HCC)     Past Surgical History:  Procedure Laterality Date  . ADENOIDECTOMY    . TONSILLECTOMY      OB History    Gravida  2   Para  2   Term  1   Preterm  1   AB      Living  1     SAB      IAB      Ectopic      Multiple      Live Births  1            Home Medications    Prior to Admission medications   Medication Sig Start Date End Date Taking? Authorizing Provider  ondansetron (ZOFRAN ODT) 4 MG disintegrating tablet Take 1 tablet (4 mg total) by mouth every 8 (eight) hours as needed for nausea or vomiting. 10/22/20  Yes Particia Nearing, PA-C  benzonatate (TESSALON) 100 MG capsule Take 1 capsule (100 mg total) by mouth every 8 (eight)  hours. 12/07/19   Hall-Potvin, [place], PA-C  etonogestrel (NEXPLANON) 68 MG IMPL implant 1 each by Subdermal route once. Placed 11/2018    [provider]  fluticasone (FLONASE) 50 MCG/ACT nasal spray Place 1 spray into both nostrils daily. 12/07/19   Hall-Potvin, [place], PA-C  loratadine (CLARITIN) 10 MG tablet Take 1 tablet (10 mg total) by mouth daily. 12/07/19   Hall-Potvin, [place], PA-C  predniSONE (DELTASONE) 20 MG tablet Take 1 tablet (20 mg total) by mouth daily. 12/07/19   Hall-Potvin, [place], PA-C  labetalol (NORMODYNE) 300 MG tablet Take 2 tablets (600 mg total) by mouth 2 (two) times daily. Patient not taking: Reported on 02/22/2019 12/07/18 06/30/19  Marylene Land, CNM  lisinopril (ZESTRIL) 20 MG tablet Take 1 tablet (20 mg total) by mouth 2 (two) times a day. Patient not taking: Reported on 02/22/2019 12/07/18 06/30/19  Marylene Land, CNM  NIFEdipine (PROCARDIA XL) 60 MG 24 hr tablet Take 1 tablet (60 mg total) by mouth daily. Patient not taking: Reported on 12/29/2018 12/21/18 06/30/19  Bing Neighbors, FNP  sertraline (ZOLOFT) 50 MG tablet Take 1  tablet (50 mg total) by mouth daily. Patient not taking: Reported on 02/22/2019 11/13/18 06/30/19  Adam Phenix, MD    Family History Family History  Problem Relation Age of Onset  . Heart disease Mother   . Hypertension Mother   . Heart murmur Mother   . Arrhythmia Mother   . Hypertension Father   . Breast cancer Maternal Grandmother   . Alzheimer's disease Maternal Grandmother   . Breast cancer Paternal Grandmother   . Esophageal cancer Maternal Uncle   . Diabetes Paternal Aunt   . Stomach cancer Maternal Uncle   . Heart disease Maternal Grandfather   . Hypertension Maternal Grandfather   . Stroke Maternal Grandfather   . Breast cancer Maternal Great-grandmother   . Hyperlipidemia Maternal Great-grandfather   . Stroke Maternal Great-grandfather   . Hearing loss Neg Hx     Social  History Social History   Tobacco Use  . Smoking status: Former Smoker    Types: Cigarettes  . Smokeless tobacco: Never Used  . Tobacco comment: quit  before preg  Vaping Use  . Vaping Use: Never used  Substance Use Topics  . Alcohol use: No  . Drug use: Not Currently    Types: Marijuana    Comment: last use 30 May 2018     Allergies   Ibuprofen   Review of Systems Review of Systems Per HPI  Physical Exam Triage Vital Signs ED Triage Vitals  Enc Vitals Group     BP 10/22/20 1847 (!) 163/110     Pulse Rate 10/22/20 1847 90     Resp 10/22/20 1847 18     Temp 10/22/20 1847 99.6 F (37.6 C)     Temp src --      SpO2 10/22/20 1847 98 %     Weight --      Height --      Head Circumference --      Peak Flow --      Pain Score 10/22/20 1845 6     Pain Loc --      Pain Edu? --      Excl. in GC? --    No data found.  Updated Vital Signs BP (!) 163/110   Pulse 90   Temp 99.6 F (37.6 C)   Resp 18   SpO2 98%   Breastfeeding No   Visual Acuity Right Eye Distance:   Left Eye Distance:   Bilateral Distance:    Right Eye Near:   Left Eye Near:    Bilateral Near:     Physical Exam Vitals and nursing note reviewed.  Constitutional:      Appearance: Normal appearance. She is not ill-appearing.  HENT:     Head: Atraumatic.  Eyes:     Extraocular Movements: Extraocular movements intact.     Conjunctiva/sclera: Conjunctivae normal.  Cardiovascular:     Rate and Rhythm: Normal rate and regular rhythm.     Heart sounds: Normal heart sounds.  Pulmonary:     Effort: Pulmonary effort is normal.     Breath sounds: Normal breath sounds.  Abdominal:     General: Bowel sounds are normal. There is no distension.     Palpations: Abdomen is soft. There is no mass.     Tenderness: There is abdominal tenderness. There is no right CVA tenderness, left CVA tenderness, guarding or rebound.     Comments: Mild epigastric, mid abdominal, and left upper quadrant tenderness to  palpation  Genitourinary:  Comments: GU exam deferred, self swab performed Musculoskeletal:        General: Normal range of motion.     Cervical back: Normal range of motion and neck supple.  Skin:    General: Skin is warm and dry.  Neurological:     Mental Status: She is alert and oriented to person, place, and time.  Psychiatric:        Mood and Affect: Mood normal.        Thought Content: Thought content normal.        Judgment: Judgment normal.      UC Treatments / Results  Labs (all labs ordered are listed, but only abnormal results are displayed) Labs Reviewed  POCT URINALYSIS DIPSTICK, ED / UC - Abnormal; Notable for the following components:      Result Value   Ketones, ur 15 (*)    Hgb urine dipstick LARGE (*)    Protein, ur 30 (*)    All other components within normal limits  POC URINE PREG, ED  CERVICOVAGINAL ANCILLARY ONLY    EKG   Radiology No results found.  Procedures Procedures (including critical care time)  Medications Ordered in UC Medications - No data to display  Initial Impression / Assessment and Plan / UC Course  I have reviewed the triage vital signs and the nursing notes.  Pertinent labs & imaging results that were available during my care of the patient were reviewed by me and considered in my medical decision making (see chart for details).     Hypertensive today in triage, patient states that she used to be on nifedipine and metoprolol for this but stopped because they made her feel sick.  Otherwise exam and vital signs fairly reassuring.  UA without evidence of a urinary tract infection.  Urine pregnancy negative.  Vaginal swab pending.  Suspect GI virus causing symptoms.  Will treat with Zofran, Imodium as needed, brat diet, fluids.  Work note given.  Return for acutely worsening symptoms at any time.  Final Clinical Impressions(s) / UC Diagnoses   Final diagnoses:  Pain of upper abdomen  Fever, unspecified fever cause  Nausea  without vomiting   Discharge Instructions   None    ED Prescriptions    Medication Sig Dispense Auth. Provider   ondansetron (ZOFRAN ODT) 4 MG disintegrating tablet Take 1 tablet (4 mg total) by mouth every 8 (eight) hours as needed for nausea or vomiting. 20 tablet Particia Nearing, [place]     PDMP not reviewed this encounter.   Particia Nearing, [place] 10/22/20 1928

## 2020-10-22 NOTE — ED Triage Notes (Addendum)
Pt in with c/o bilateral upper quadrant pain, nausea, and back pain that started yesterday. Also c/o yellow vaginal discharge. States that she thinks she may have UTI  Denies any urinary sxs  Pt also requesting STD testing   Pt also states she has not been taking BP medication. States her prior pcp prescribed her nifedipine and metoprolol but she stopped taking them because they made her feel sick.

## 2020-10-23 ENCOUNTER — Telehealth (HOSPITAL_COMMUNITY): Payer: Self-pay | Admitting: Emergency Medicine

## 2020-10-23 LAB — CERVICOVAGINAL ANCILLARY ONLY
Bacterial Vaginitis (gardnerella): POSITIVE — AB
Candida Glabrata: NEGATIVE
Candida Vaginitis: NEGATIVE
Chlamydia: NEGATIVE
Comment: NEGATIVE
Comment: NEGATIVE
Comment: NEGATIVE
Comment: NEGATIVE
Comment: NEGATIVE
Comment: NORMAL
Neisseria Gonorrhea: NEGATIVE
Trichomonas: NEGATIVE

## 2020-10-23 MED ORDER — METRONIDAZOLE 500 MG PO TABS: 500.0000 mg | ORAL_TABLET | Freq: Two times a day (BID) | ORAL | 0 refills | Status: DC

## 2021-02-13 IMAGING — US ULTRASOUND ABDOMEN LIMITED
1 series · 14 of 25 positions shown · non-contrast
Comparison: None.

CLINICAL DATA: Right upper quadrant pain

EXAM:
ULTRASOUND ABDOMEN LIMITED RIGHT UPPER QUADRANT

[Series 1: ultrasound abdomen limited · 14 of 80 slices shown]
[im 1/80]
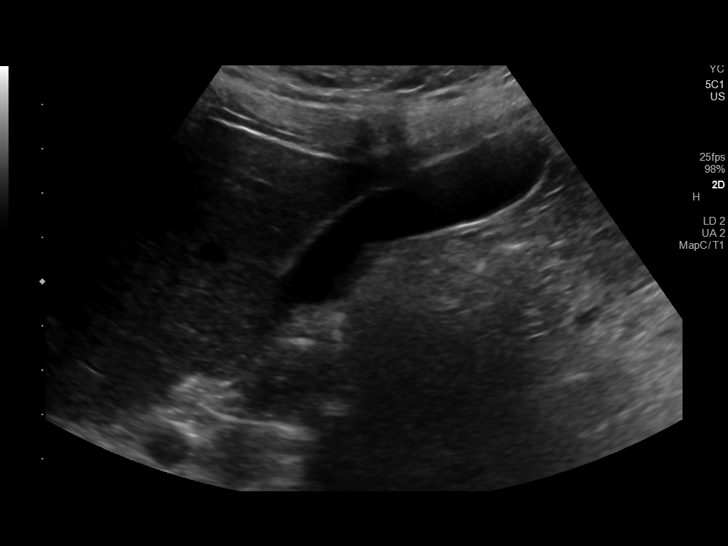
[im 7/80]
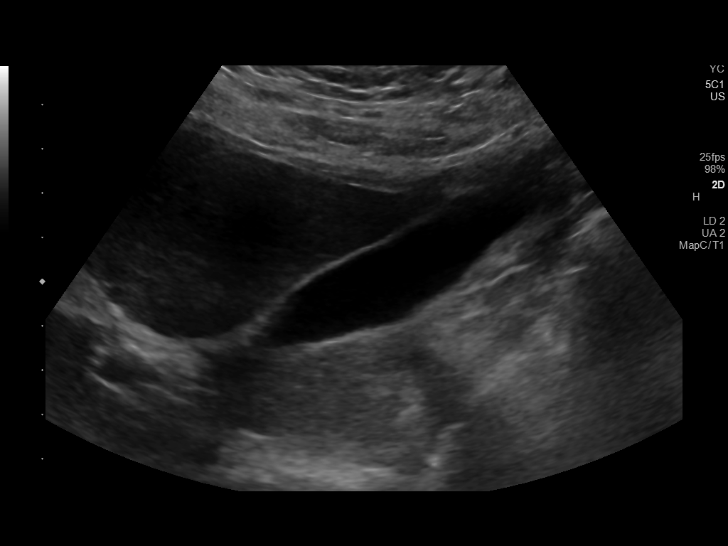
[im 14/80]
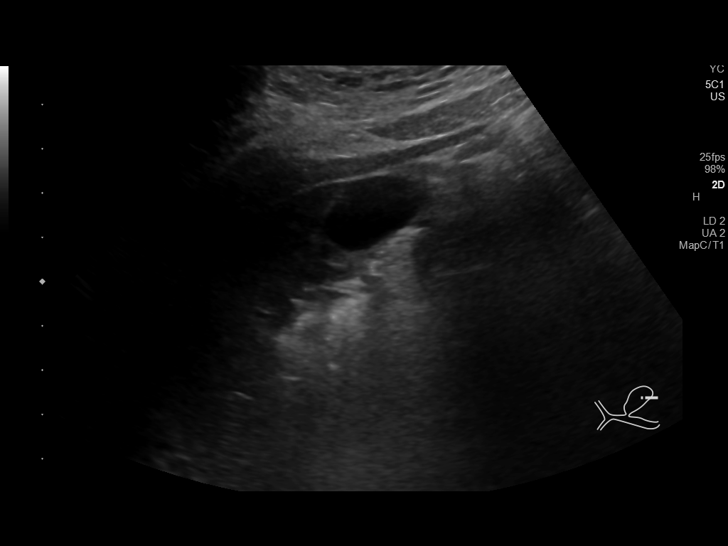
[im 20/80]
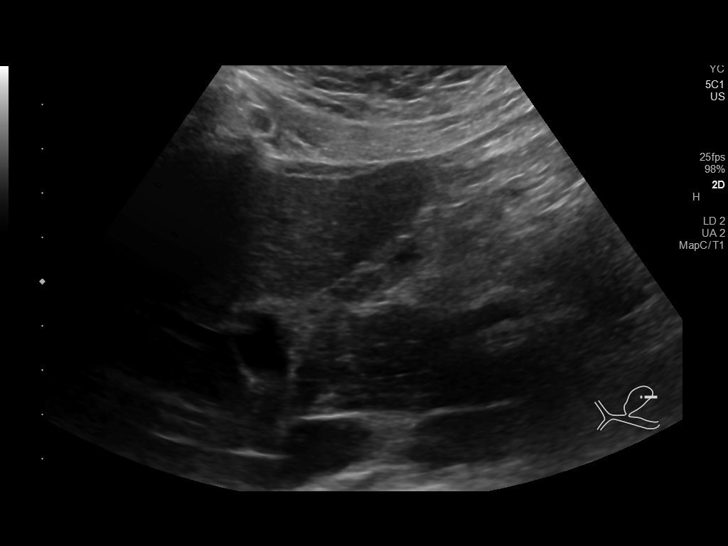
[im 27/80]
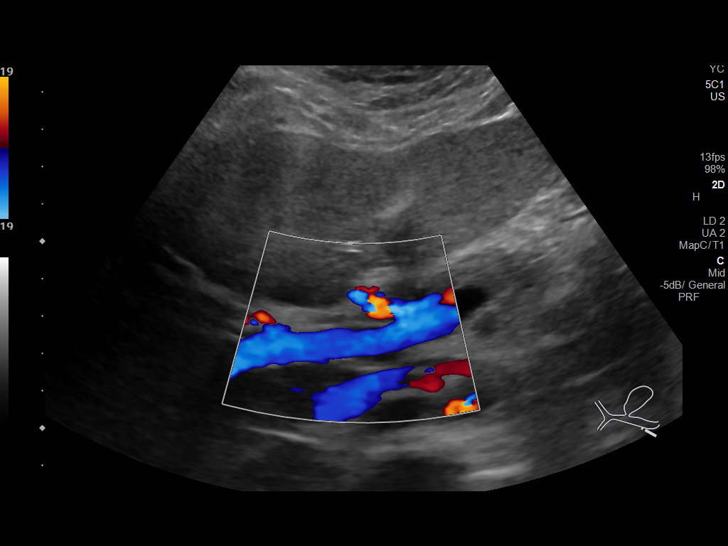
[im 30/80]
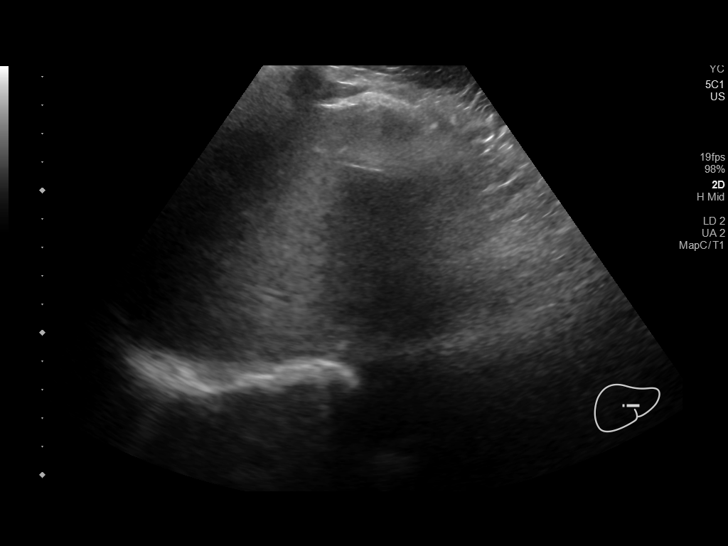
[im 37/80]
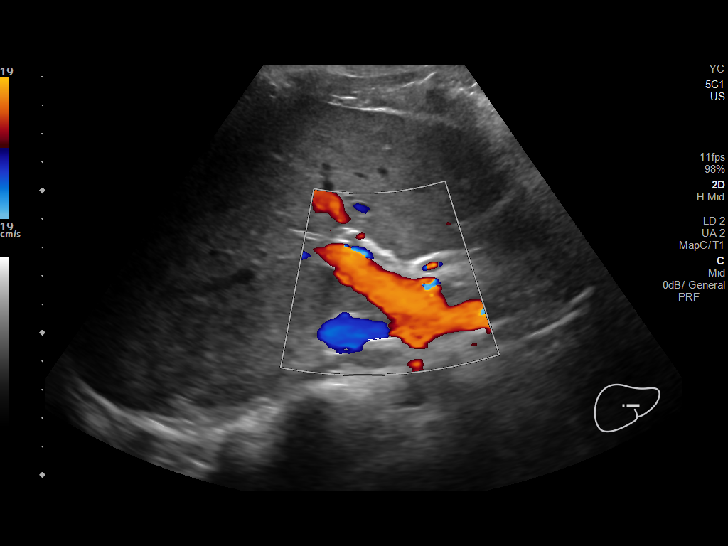
[im 43/80]
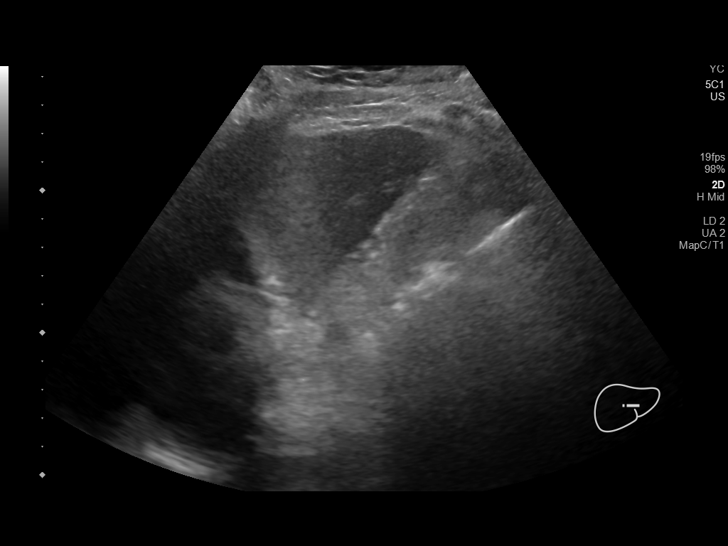
[im 50/80]
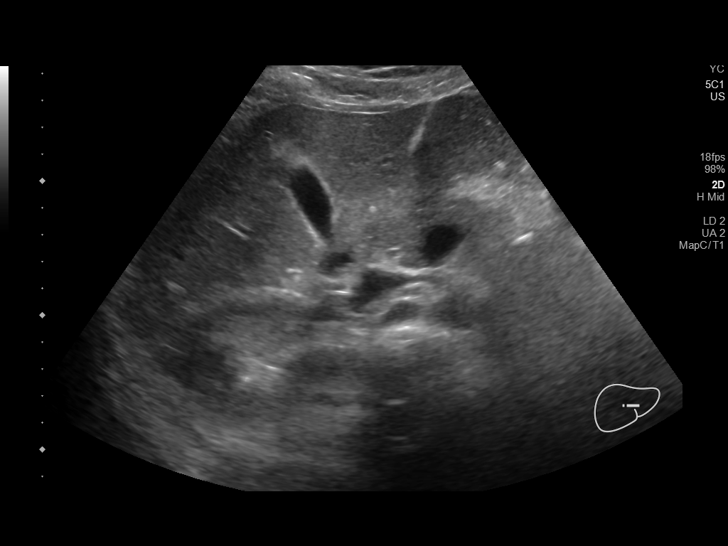
[im 53/80]
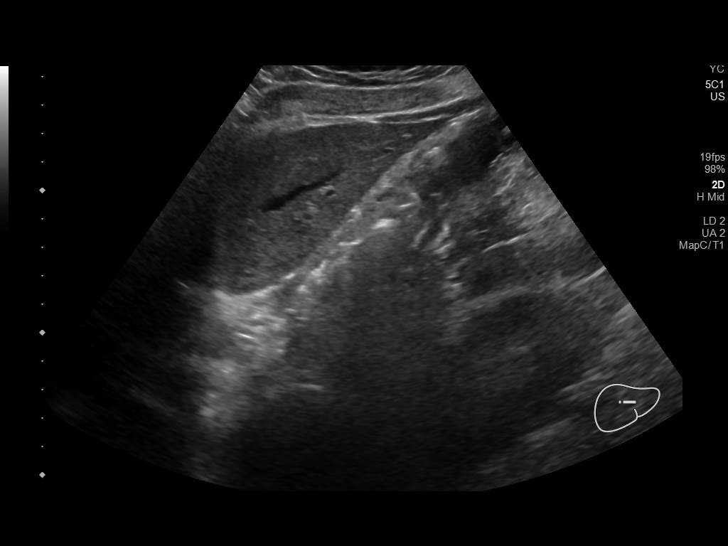
[im 60/80]
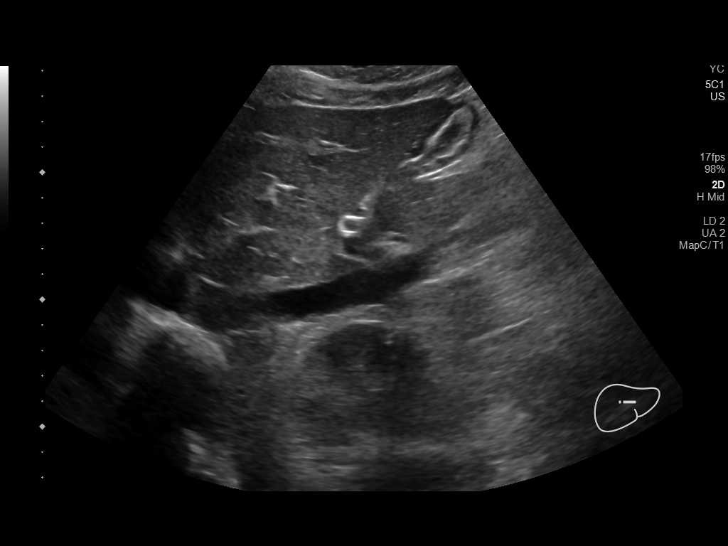
[im 66/80]
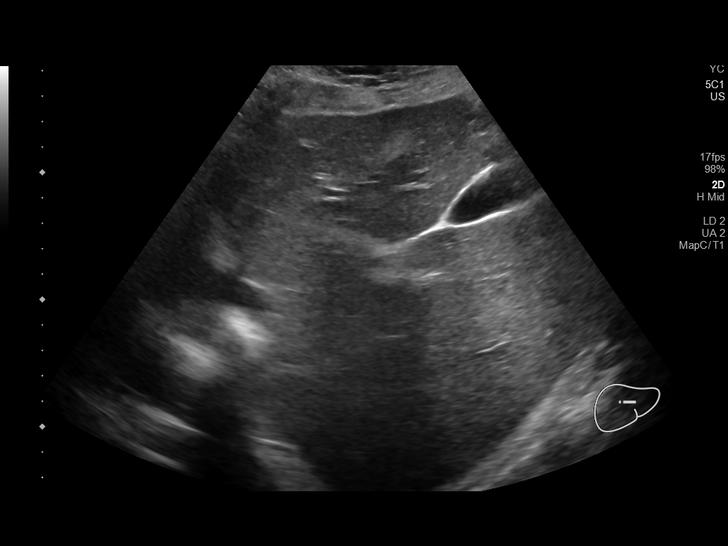
[im 73/80]
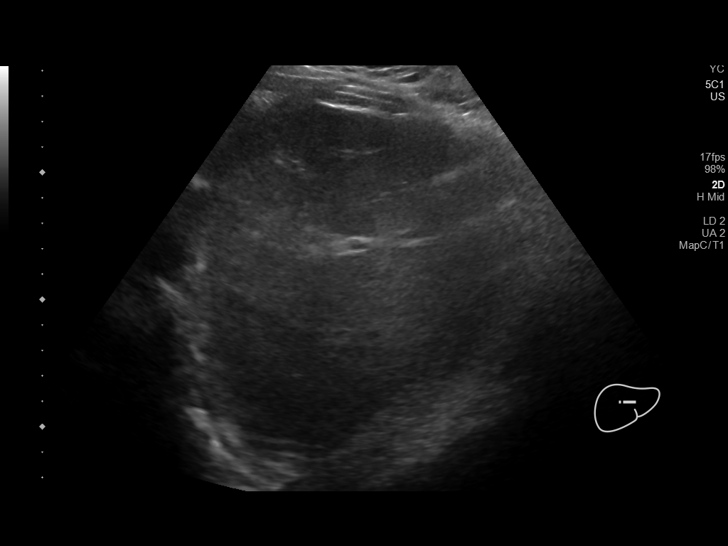
[im 80/80]
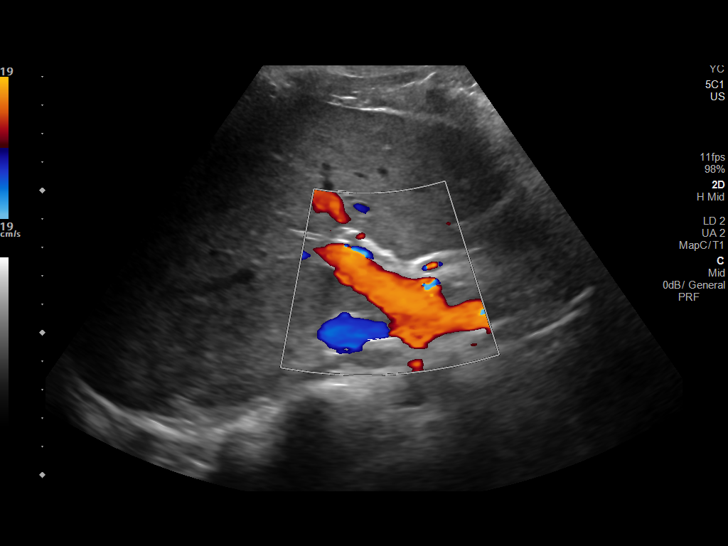

[14 of 25 positions shown; findings below may reference images not displayed]

FINDINGS: Gallbladder:

No gallstones or wall thickening visualized. No sonographic Murphy
sign noted by sonographer.

Common bile duct:

Diameter: 2 mm

Liver:

No focal lesion identified. Within normal limits in parenchymal
echogenicity. Portal vein is patent on color Doppler imaging with
normal direction of blood flow towards the liver.
IMPRESSION: Normal right upper quadrant ultrasound

## 2021-04-17 ENCOUNTER — Encounter: Payer: PRIVATE HEALTH INSURANCE | Admitting: Adult Health

## 2021-05-31 ENCOUNTER — Institutional Professional Consult (permissible substitution): Payer: PRIVATE HEALTH INSURANCE | Admitting: Plastic Surgery

## 2021-06-05 ENCOUNTER — Encounter: Payer: Self-pay | Admitting: Plastic Surgery

## 2021-06-05 ENCOUNTER — Ambulatory Visit (INDEPENDENT_AMBULATORY_CARE_PROVIDER_SITE_OTHER): Payer: Medicaid Other | Admitting: Plastic Surgery

## 2021-06-05 ENCOUNTER — Other Ambulatory Visit: Payer: Self-pay

## 2021-06-05 VITALS — BP 128/89 | HR 92 | Ht 61.0 in | Wt 186.0 lb

## 2021-06-05 DIAGNOSIS — M546 Pain in thoracic spine: Secondary | ICD-10-CM | POA: Diagnosis not present

## 2021-06-05 DIAGNOSIS — M4004 Postural kyphosis, thoracic region: Secondary | ICD-10-CM | POA: Diagnosis not present

## 2021-06-05 DIAGNOSIS — M545 Low back pain, unspecified: Secondary | ICD-10-CM | POA: Diagnosis not present

## 2021-06-05 DIAGNOSIS — N62 Hypertrophy of breast: Secondary | ICD-10-CM

## 2021-06-05 NOTE — Progress Notes (Signed)
Referring Provider Bing Neighbors, FNP [address] [place],  [place] [address]   CC:  Chief Complaint  Patient presents with   consult      Karen Booth is an 25 y.o. female.  HPI: Patient presents to discuss breast reduction.  She said years of back pain, neck pain and shoulder grooving related to her large breast.  She is tried over-the-counter medications, warm packs, cold packs and supportive bras with little relief.  She also gets rashes beneath her breast that been refractory to over-the-counter treatments.  She is currently a triple D cup and wants to be a C cup.  Her paternal grandmother had breast cancer.  She does not smoke and is a well-controlled diabetic with a hemoglobin A1c of 5.9.  She is interested in breast reduction surgery.  Allergies  Allergen Reactions   Ibuprofen Anaphylaxis, Hives and Swelling    Oral swelling    Outpatient Encounter Medications as of 06/05/2021  Medication Sig   amLODipine (NORVASC) 5 MG tablet Take 5 mg by mouth daily.   benzonatate (TESSALON) 100 MG capsule Take 1 capsule (100 mg total) by mouth every 8 (eight) hours.   etonogestrel (NEXPLANON) 68 MG IMPL implant 1 each by Subdermal route once. Placed 11/2018   rosuvastatin (CRESTOR) 10 MG tablet Take 10 mg by mouth at bedtime.   fluticasone (FLONASE) 50 MCG/ACT nasal spray Place 1 spray into both nostrils daily.   loratadine (CLARITIN) 10 MG tablet Take 1 tablet (10 mg total) by mouth daily.   metroNIDAZOLE (FLAGYL) 500 MG tablet Take 1 tablet (500 mg total) by mouth 2 (two) times daily.   ondansetron (ZOFRAN ODT) 4 MG disintegrating tablet Take 1 tablet (4 mg total) by mouth every 8 (eight) hours as needed for nausea or vomiting.   predniSONE (DELTASONE) 20 MG tablet Take 1 tablet (20 mg total) by mouth daily.   [DISCONTINUED] labetalol (NORMODYNE) 300 MG tablet Take 2 tablets (600 mg total) by mouth 2 (two) times daily. (Patient not taking: Reported on 02/22/2019)    [DISCONTINUED] lisinopril (ZESTRIL) 20 MG tablet Take 1 tablet (20 mg total) by mouth 2 (two) times a day. (Patient not taking: Reported on 02/22/2019)   [DISCONTINUED] NIFEdipine (PROCARDIA XL) 60 MG 24 hr tablet Take 1 tablet (60 mg total) by mouth daily. (Patient not taking: Reported on 12/29/2018)   [DISCONTINUED] sertraline (ZOLOFT) 50 MG tablet Take 1 tablet (50 mg total) by mouth daily. (Patient not taking: Reported on 02/22/2019)   No facility-administered encounter medications on file as of 06/05/2021.     Past Medical History:  Diagnosis Date   Anxiety    never discussed or been treated, 'always had it, been coming up lately"   Chlamydia    Heart murmur    Hypertension    Infection    UTI   Urinary tract infection     Past Surgical History:  Procedure Laterality Date   ADENOIDECTOMY     TONSILLECTOMY      Family History  Problem Relation Age of Onset   Heart disease Mother    Hypertension Mother    Heart murmur Mother    Arrhythmia Mother    Hypertension Father    Breast cancer Maternal Grandmother    Alzheimer's disease Maternal Grandmother    Breast cancer Paternal Grandmother    Esophageal cancer Maternal Uncle    Diabetes Paternal Aunt    Stomach cancer Maternal Uncle    Heart disease Maternal Grandfather  Hypertension Maternal Grandfather    Stroke Maternal Grandfather    Breast cancer Maternal Great-grandmother    Hyperlipidemia Maternal Great-grandfather    Stroke Maternal Great-grandfather    Hearing loss Neg Hx     Social History   Social History Narrative   Not on file     Review of Systems General: Denies fevers, chills, weight loss CV: Denies chest pain, shortness of breath, palpitations  Physical Exam Vitals with BMI 06/05/2021 10/22/2020 12/07/2019  Height 5\' 1"  - -  Weight 186 lbs - -  BMI 35.16 - -  Systolic 128 163  Diastolic 89 110 91  Pulse 92 90 105    General:  No acute distress,  Alert and oriented, Non-Toxic, Normal  speech and affect Breast: She has grade 3 ptosis.  Sternal notch to nipple is 31 cm on the left and 30 cm on the right.  Nipple to fold is 18 cm on the left and 17 cm on the right.  No obvious scars or masses.  Assessment/Plan The patient has bilateral symptomatic macromastia.  She is a good candidate for a breast reduction.  She is interested in pursuing surgical treatment.  She has tried supportive garments and fitted bras with no relief.  The details of breast reduction surgery were discussed.  I explained the procedure in detail along the with the expected scars.  The risks were discussed in detail and include bleeding, infection, damage to surrounding structures, need for additional procedures, nipple loss, change in nipple sensation, persistent pain, contour irregularities and asymmetries.  I explained that breast feeding is often not possible after breast reduction surgery.  We discussed the expected postoperative course with an overall recovery period of about 1 month.  She demonstrated full understanding of all risks.  We discussed her personal risk factors.  The patient is interested in pursuing surgical treatment.  I anticipate approximately 530g of tissue removed from each side.   350 06/05/2021, 4:50 PM

## 2021-08-01 ENCOUNTER — Encounter: Payer: Self-pay | Admitting: Surgical

## 2021-08-01 ENCOUNTER — Ambulatory Visit (INDEPENDENT_AMBULATORY_CARE_PROVIDER_SITE_OTHER): Payer: Medicaid Other | Admitting: Surgical

## 2021-08-01 ENCOUNTER — Other Ambulatory Visit: Payer: Self-pay

## 2021-08-01 VITALS — BP 134/84 | HR 90 | Ht 61.0 in | Wt 194.0 lb

## 2021-08-01 DIAGNOSIS — M546 Pain in thoracic spine: Secondary | ICD-10-CM

## 2021-08-01 DIAGNOSIS — N62 Hypertrophy of breast: Secondary | ICD-10-CM

## 2021-08-01 DIAGNOSIS — M545 Low back pain, unspecified: Secondary | ICD-10-CM

## 2021-08-01 DIAGNOSIS — M4004 Postural kyphosis, thoracic region: Secondary | ICD-10-CM

## 2021-08-01 MED ORDER — HYDROCODONE-ACETAMINOPHEN 5-325 MG PO TABS: 1.0000 | ORAL_TABLET | Freq: Four times a day (QID) | ORAL | 0 refills | Status: AC | PRN

## 2021-08-01 MED ORDER — ONDANSETRON HCL 4 MG PO TABS: 4.0000 mg | ORAL_TABLET | Freq: Three times a day (TID) | ORAL | 0 refills | Status: AC | PRN

## 2021-08-01 NOTE — Progress Notes (Signed)
Patient ID: Karen Booth, female    DOB: 04/23/1996, 25 y.o.   MRN: [id number]  Chief Complaint  Patient presents with   Pre-op Exam      ICD-10-CM   1. Macromastia  N62     2. Back pain of thoracolumbar region  M54.50    M54.6     3. Postural kyphosis, thoracic region  M40.04       History of Present Illness: Karen Booth is a 25 y.o.  female  with a history of macromastia.  She presents for preoperative evaluation for upcoming procedure, Bilateral Breast Reduction, scheduled for 08/23/2021 with Dr.  Arita Miss  The patient has not had problems with anesthesia. No history of DVT/PE.  No family history of DVT/PE.  No family or personal history of bleeding or clotting disorders.  Patient is not currently taking any blood thinners.  No history of CVA/MI.   She would like to be about a C cup.  She does report today that she is smoking, reports she had previously quit for 4 weeks but restarted smoking again this week.  She has smoked 2 cigarettes/day.  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 She reports today that she has no cardiac or pulmonary diseases.    Past Medical History: Allergies: Allergies  Allergen Reactions   Ibuprofen Anaphylaxis, Hives and Swelling    Oral swelling    Current Medications:  Current Outpatient Medications:    amLODipine (NORVASC) 5 MG tablet, Take 5 mg by mouth daily., Disp: , Rfl:    rosuvastatin (CRESTOR) 10 MG tablet, Take 10 mg by mouth at bedtime., Disp: , Rfl:   Past Medical Problems: Past Medical History:  Diagnosis Date   Anxiety    never discussed or been treated, 'always had it, been coming up lately"   Chlamydia    Heart murmur    Hypertension    Infection    UTI   Urinary tract infection     Past Surgical History: Past Surgical History:  Procedure  Laterality Date   ADENOIDECTOMY     TONSILLECTOMY      Social History: Social History   Socioeconomic History   Marital status: Single    Spouse name: Not on file   Number of children: 2   Years of education: Not on file   Highest education level: 12th grade  Occupational History   Not on file  Tobacco Use   Smoking status: Former    Types: Cigarettes   Smokeless tobacco: Never   Tobacco comments:    quit  before preg  Vaping Use   Vaping Use: Never used  Substance and Sexual Activity   Alcohol use: No   Drug use: Not Currently    Types: Marijuana    Comment: last use 30 May 2018   Sexual activity: Yes    Birth control/protection: Implant  Other Topics Concern   Not on file  Social History Narrative   Not on file   Social Determinants of Health   Financial Resource Strain: Not on file  Food Insecurity: Not on file  Transportation Needs: Not on file  Physical Activity: Not on file  Stress: Not on file  Social Connections: Not on file  Intimate Partner Violence: Not on file    Family History: Family History  Problem Relation Age of Onset   Heart disease Mother    Hypertension Mother    Heart murmur Mother    Arrhythmia Mother    Hypertension Father    Breast cancer Maternal Grandmother    Alzheimer's disease Maternal Grandmother    Breast cancer Paternal Grandmother    Esophageal cancer Maternal Uncle    Diabetes Paternal Aunt    Stomach cancer Maternal Uncle    Heart disease Maternal Grandfather    Hypertension Maternal Grandfather    Stroke Maternal Grandfather    Breast cancer Maternal Great-grandmother    Hyperlipidemia Maternal Great-grandfather    Stroke Maternal Great-grandfather    Hearing loss Neg Hx     Review of Systems: Review of Systems  Constitutional: Negative.   Respiratory: Negative.    Cardiovascular: Negative.   Gastrointestinal: Negative.   Musculoskeletal:  Positive for back pain and neck pain.  Neurological: Negative.     Physical Exam: Vital Signs BP 134/84 (BP Location: Left Arm, Patient Position: Sitting, Cuff Size: Normal)    Pulse 90    Ht 5\' 1"  (1.549 m)    Wt 194 lb (88 kg)    SpO2 100%    BMI 36.66 kg/m   Physical Exam  Constitutional:      General: Not in acute distress.    Appearance: Normal appearance. Not ill-appearing.  HENT:     Head: Normocephalic and atraumatic.  Eyes:     Pupils: Pupils are equal, round Neck:     Musculoskeletal: Normal range of motion.  Cardiovascular:     Rate and Rhythm: Normal rate    Pulses: Normal pulses.  Pulmonary:     Effort: Pulmonary effort is normal. No respiratory distress.  Musculoskeletal: Normal range of motion.  Skin:    General: Skin is warm and dry.     Findings: No erythema or rash.  Neurological:     General: No focal deficit present.     Mental Status: Alert and oriented to person, place, and time. Mental status is at baseline.     Motor: No weakness.  Psychiatric:        Mood and Affect: Mood normal.        Behavior: Behavior normal.    Assessment/Plan: The patient is scheduled for bilateral breast reduction with Dr. .  Risks, benefits, and alternatives of procedure discussed, questions answered and consent obtained.    Smoking Status: Current smoker, 2 cigarettes/day.  Previously quit for the past month but restarted smoking this week.; Counseling Given?  We discussed the need to stop smoking today, we discussed if she continued smoking increased risks of nipple areolar necrosis, poor wound healing.  Patient verbalized that she would quit.  Caprini Score: 4; Risk Factors include: BMI greater than 25, Nexplanon in place and length of planned surgery. Recommendation for mechanical prophylaxis. Encourage early ambulation.   Pictures obtained: @consult   Post-op Rx sent to pharmacy: Norco, Zofran  Patient was provided with the breast reduction and General Surgical Risk consent document and Pain Medication Agreement prior to their  appointment.  They had adequate time to read through the risk consent documents and Pain Medication Agreement. We also discussed them in person together during this preop appointment. All of their questions were answered to their satisfaction.  Recommended calling if they have any further questions.  Risk consent form and Pain Medication Agreement to be scanned into patient's chart.  The risk that can be encountered with breast reduction were discussed and include the  following but not limited to these:  Breast asymmetry, fluid accumulation, firmness of the breast, inability to breast feed, loss of nipple or areola, skin loss, decrease or no nipple sensation, fat necrosis of the breast tissue, bleeding, infection, healing delay.  There are risks of anesthesia, changes to skin sensation and injury to nerves or blood vessels.  The muscle can be temporarily or permanently injured.  You may have an allergic reaction to tape, suture, glue, blood products which can result in skin discoloration, swelling, pain, skin lesions, poor healing.  Any of these can lead to the need for revisonal surgery or stage procedures.  A reduction has potential to interfere with diagnostic procedures.  Nipple or breast piercing can increase risks of infection.  This procedure is best done when the breast is fully developed.  Changes in the breast will continue to occur over time.  Pregnancy can alter the outcomes of previous breast reduction surgery, weight gain and weigh loss can also effect the long term appearance.    Electronically signed by: Kermit Balo Kylii Ennis, PA-C 08/01/2021 3:42 PM

## 2021-08-01 NOTE — H&P (View-Only) (Signed)
° °  Patient ID: Karen Booth, female    DOB: 12/10/1995, 25 y.o.   MRN: [id number] ° °Chief Complaint  °Patient presents with  ° Pre-op Exam  ° ° °  ICD-10-CM   °1. Macromastia  N62   °  °2. Back pain of thoracolumbar region  M54.50   ° M54.6   °  °3. Postural kyphosis, thoracic region  M40.04   °  ° ° °History of Present Illness: °Cambri Booth is a 25 y.o.  female  with a history of macromastia.  She presents for preoperative evaluation for upcoming procedure, Bilateral Breast Reduction, scheduled for 08/23/2021 with Dr.  Pace ° °The patient has not had problems with anesthesia. No history of DVT/PE.  No family history of DVT/PE.  No family or personal history of bleeding or clotting disorders.  Patient is not currently taking any blood thinners.  No history of CVA/MI.  ° °She would like to be about a C cup.  She does report today that she is smoking, reports she had previously quit for 4 weeks but restarted smoking again this week.  She has smoked 2 cigarettes/day. 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 °She reports today that she has no cardiac or pulmonary diseases. ° ° ° °Past Medical History: °Allergies: °Allergies  °Allergen Reactions  ° Ibuprofen Anaphylaxis, Hives and Swelling  °  Oral swelling  ° ° °Current Medications: ° °Current Outpatient Medications:  °  amLODipine (NORVASC) 5 MG tablet, Take 5 mg by mouth daily., Disp: , Rfl:  °  rosuvastatin (CRESTOR) 10 MG tablet, Take 10 mg by mouth at bedtime., Disp: , Rfl:  ° °Past Medical Problems: °Past Medical History:  °Diagnosis Date  ° Anxiety   ° never discussed or been treated, 'always had it, been coming up lately"  ° Chlamydia   ° Heart murmur   ° Hypertension   ° Infection   ° UTI  ° Urinary tract infection   ° ° °Past Surgical History: °Past Surgical History:  °Procedure  Laterality Date  ° ADENOIDECTOMY    ° TONSILLECTOMY    ° ° °Social History: °Social History  ° °Socioeconomic History  ° Marital status: Single  °  Spouse name: Not on file  ° Number of children: 2  ° Years of education: Not on file  ° Highest education level: 12th grade  °Occupational History  ° Not on file  °Tobacco Use  ° Smoking status: Former  °  Types: Cigarettes  ° Smokeless tobacco: Never  ° Tobacco comments:  °  quit  before preg  °Vaping Use  ° Vaping Use: Never used  °Substance and Sexual Activity  ° Alcohol use: No  ° Drug use: Not Currently  °  Types: Marijuana  °  Comment: last use 30 May 2018  ° Sexual activity: Yes  °  Birth control/protection: Implant  °Other Topics Concern  ° Not on file  °Social History Narrative  ° Not on file  ° °Social Determinants of Health  ° °Financial Resource Strain: Not on file  °Food Insecurity: Not on file  °Transportation Needs: Not on file  °Physical Activity: Not on file  °Stress: Not on file  °Social Connections: Not on file  °Intimate Partner Violence: Not on file  ° ° °Family History: °Family History  °  Problem Relation Age of Onset  ° Heart disease Mother   ° Hypertension Mother   ° Heart murmur Mother   ° Arrhythmia Mother   ° Hypertension Father   ° Breast cancer Maternal Grandmother   ° Alzheimer's disease Maternal Grandmother   ° Breast cancer Paternal Grandmother   ° Esophageal cancer Maternal Uncle   ° Diabetes Paternal Aunt   ° Stomach cancer Maternal Uncle   ° Heart disease Maternal Grandfather   ° Hypertension Maternal Grandfather   ° Stroke Maternal Grandfather   ° Breast cancer Maternal Great-grandmother   ° Hyperlipidemia Maternal Great-grandfather   ° Stroke Maternal Great-grandfather   ° Hearing loss Neg Hx   ° ° °Review of Systems: °Review of Systems  °Constitutional: Negative.   °Respiratory: Negative.    °Cardiovascular: Negative.   °Gastrointestinal: Negative.   °Musculoskeletal:  Positive for back pain and neck pain.  °Neurological: Negative.    ° °Physical Exam: °Vital Signs °BP 134/84 (BP Location: Left Arm, Patient Position: Sitting, Cuff Size: Normal)    Pulse 90    Ht 5' 1" (1.549 m)    Wt 194 lb (88 kg)    SpO2 100%    BMI 36.66 kg/m²  ° °Physical Exam  °Constitutional:   °   General: Not in acute distress. °   Appearance: Normal appearance. Not ill-appearing.  °HENT:  °   Head: Normocephalic and atraumatic.  °Eyes:  °   Pupils: Pupils are equal, round °Neck:  °   Musculoskeletal: Normal range of motion.  °Cardiovascular:  °   Rate and Rhythm: Normal rate °   Pulses: Normal pulses.  °Pulmonary:  °   Effort: Pulmonary effort is normal. No respiratory distress.  °Musculoskeletal: Normal range of motion.  °Skin: °   General: Skin is warm and dry.  °   Findings: No erythema or rash.  °Neurological:  °   General: No focal deficit present.  °   Mental Status: Alert and oriented to person, place, and time. Mental status is at baseline.  °   Motor: No weakness.  °Psychiatric:     °   Mood and Affect: Mood normal.     °   Behavior: Behavior normal.  ° ° °Assessment/Plan: °The patient is scheduled for bilateral breast reduction with Dr. Pace.  Risks, benefits, and alternatives of procedure discussed, questions answered and consent obtained.   ° °Smoking Status: Current smoker, 2 cigarettes/day.  Previously quit for the past month but restarted smoking this week.; Counseling Given?  We discussed the need to stop smoking today, we discussed if she continued smoking increased risks of nipple areolar necrosis, poor wound healing.  Patient verbalized that she would quit. ° °Caprini Score: 4; Risk Factors include: BMI greater than 25, Nexplanon in place and length of planned surgery. Recommendation for mechanical prophylaxis. Encourage early ambulation.  ° °Pictures obtained: @consult ° °Post-op Rx sent to pharmacy: Norco, Zofran ° °Patient was provided with the breast reduction and General Surgical Risk consent document and Pain Medication Agreement prior to their  appointment.  They had adequate time to read through the risk consent documents and Pain Medication Agreement. We also discussed them in person together during this preop appointment. All of their questions were answered to their satisfaction.  Recommended calling if they have any further questions.  Risk consent form and Pain Medication Agreement to be scanned into patient's chart. ° °The risk that can be encountered with breast reduction were discussed and include the   following but not limited to these:  Breast asymmetry, fluid accumulation, firmness of the breast, inability to breast feed, loss of nipple or areola, skin loss, decrease or no nipple sensation, fat necrosis of the breast tissue, bleeding, infection, healing delay.  There are risks of anesthesia, changes to skin sensation and injury to nerves or blood vessels.  The muscle can be temporarily or permanently injured.  You may have an allergic reaction to tape, suture, glue, blood products which can result in skin discoloration, swelling, pain, skin lesions, poor healing.  Any of these can lead to the need for revisonal surgery or stage procedures.  A reduction has potential to interfere with diagnostic procedures.  Nipple or breast piercing can increase risks of infection.  This procedure is best done when the breast is fully developed.  Changes in the breast will continue to occur over time.  Pregnancy can alter the outcomes of previous breast reduction surgery, weight gain and weigh loss can also effect the long term appearance.    Electronically signed by: Kermit Balo Lillionna Nabi, PA-C 08/01/2021 3:42 PM

## 2021-08-13 ENCOUNTER — Encounter (HOSPITAL_BASED_OUTPATIENT_CLINIC_OR_DEPARTMENT_OTHER): Payer: Self-pay | Admitting: Plastic Surgery

## 2021-08-15 ENCOUNTER — Encounter (HOSPITAL_BASED_OUTPATIENT_CLINIC_OR_DEPARTMENT_OTHER)
Admission: RE | Admit: 2021-08-15 | Discharge: 2021-08-15 | Disposition: A | Discharge status: Home / Self Care | Payer: Medicaid Other | Source: Ambulatory Visit | Attending: Plastic Surgery | Admitting: Plastic Surgery

## 2021-08-15 DIAGNOSIS — Z01812 Encounter for preprocedural laboratory examination: Secondary | ICD-10-CM | POA: Insufficient documentation

## 2021-08-15 NOTE — Progress Notes (Signed)
Surgical soap given with instructions, pt verbalized understanding.  

## 2021-08-23 ENCOUNTER — Encounter (HOSPITAL_BASED_OUTPATIENT_CLINIC_OR_DEPARTMENT_OTHER): Payer: Self-pay | Admitting: Plastic Surgery

## 2021-08-23 ENCOUNTER — Ambulatory Visit (HOSPITAL_BASED_OUTPATIENT_CLINIC_OR_DEPARTMENT_OTHER): Payer: Medicaid Other | Admitting: Anesthesiology

## 2021-08-23 ENCOUNTER — Ambulatory Visit (HOSPITAL_BASED_OUTPATIENT_CLINIC_OR_DEPARTMENT_OTHER)
Admission: RE | Admit: 2021-08-23 | Discharge: 2021-08-23 | Disposition: A | Discharge status: Home / Self Care | Payer: Medicaid Other | Attending: Plastic Surgery | Admitting: Plastic Surgery

## 2021-08-23 ENCOUNTER — Other Ambulatory Visit: Payer: Self-pay

## 2021-08-23 ENCOUNTER — Encounter (HOSPITAL_BASED_OUTPATIENT_CLINIC_OR_DEPARTMENT_OTHER)
Admission: RE | Disposition: A | Discharge status: Home / Self Care | Payer: Self-pay | Source: Home / Self Care | Attending: Plastic Surgery

## 2021-08-23 DIAGNOSIS — M5404 Panniculitis affecting regions of neck and back, thoracic region: Secondary | ICD-10-CM | POA: Diagnosis not present

## 2021-08-23 DIAGNOSIS — Z975 Presence of (intrauterine) contraceptive device: Secondary | ICD-10-CM | POA: Insufficient documentation

## 2021-08-23 DIAGNOSIS — F17210 Nicotine dependence, cigarettes, uncomplicated: Secondary | ICD-10-CM | POA: Insufficient documentation

## 2021-08-23 DIAGNOSIS — I10 Essential (primary) hypertension: Secondary | ICD-10-CM | POA: Insufficient documentation

## 2021-08-23 DIAGNOSIS — M545 Low back pain, unspecified: Secondary | ICD-10-CM | POA: Insufficient documentation

## 2021-08-23 DIAGNOSIS — M546 Pain in thoracic spine: Secondary | ICD-10-CM | POA: Insufficient documentation

## 2021-08-23 DIAGNOSIS — N62 Hypertrophy of breast: Secondary | ICD-10-CM | POA: Insufficient documentation

## 2021-08-23 DIAGNOSIS — M4004 Postural kyphosis, thoracic region: Secondary | ICD-10-CM | POA: Insufficient documentation

## 2021-08-23 HISTORY — PX: BREAST REDUCTION SURGERY: SHX8

## 2021-08-23 LAB — POCT PREGNANCY, URINE: Preg Test, Ur: NEGATIVE

## 2021-08-23 SURGERY — MAMMOPLASTY, REDUCTION
Anesthesia: General | Site: Breast | Laterality: Bilateral

## 2021-08-23 MED ORDER — ONDANSETRON HCL 4 MG/2ML IJ SOLN
INTRAMUSCULAR | Status: AC
  Filled 2021-08-23: qty 2

## 2021-08-23 MED ORDER — LACTATED RINGERS IV SOLN: INTRAVENOUS | Status: DC

## 2021-08-23 MED ORDER — OXYCODONE HCL 5 MG/5ML PO SOLN: 5.0000 mg | Freq: Once | ORAL | Status: AC | PRN

## 2021-08-23 MED ORDER — MIDAZOLAM HCL 2 MG/2ML IJ SOLN
INTRAMUSCULAR | Status: AC
  Filled 2021-08-23: qty 2

## 2021-08-23 MED ORDER — TRANEXAMIC ACID-NACL 1000-0.7 MG/100ML-% IV SOLN
1000.0000 mg | INTRAVENOUS | Status: AC
  Administered 2021-08-23: 1000 mg via INTRAVENOUS

## 2021-08-23 MED ORDER — ONDANSETRON HCL 4 MG/2ML IJ SOLN
INTRAMUSCULAR | Status: DC | PRN
Start: 2021-08-23 — End: 2021-08-23
  Administered 2021-08-23: 4 mg via INTRAVENOUS

## 2021-08-23 MED ORDER — OXYCODONE HCL 5 MG PO TABS
5.0000 mg | ORAL_TABLET | Freq: Once | ORAL | Status: AC | PRN
  Administered 2021-08-23: 5 mg via ORAL

## 2021-08-23 MED ORDER — LIDOCAINE HCL (CARDIAC) PF 100 MG/5ML IV SOSY
PREFILLED_SYRINGE | INTRAVENOUS | Status: DC | PRN
  Administered 2021-08-23: 60 mg via INTRAVENOUS

## 2021-08-23 MED ORDER — ACETAMINOPHEN 500 MG PO TABS
1000.0000 mg | ORAL_TABLET | Freq: Once | ORAL | Status: AC
  Administered 2021-08-23: 1000 mg via ORAL

## 2021-08-23 MED ORDER — PROPOFOL 10 MG/ML IV BOLUS
INTRAVENOUS | Status: DC | PRN
  Administered 2021-08-23: 200 mg via INTRAVENOUS

## 2021-08-23 MED ORDER — BUPIVACAINE HCL (PF) 0.25 % IJ SOLN
INTRAMUSCULAR | Status: AC
  Filled 2021-08-23: qty 180

## 2021-08-23 MED ORDER — FENTANYL CITRATE (PF) 100 MCG/2ML IJ SOLN
INTRAMUSCULAR | Status: DC | PRN
  Administered 2021-08-23: 50 ug via INTRAVENOUS
  Administered 2021-08-23: 100 ug via INTRAVENOUS
  Administered 2021-08-23 (×3): 50 ug via INTRAVENOUS

## 2021-08-23 MED ORDER — PROPOFOL 500 MG/50ML IV EMUL
INTRAVENOUS | Status: AC
  Filled 2021-08-23: qty 50

## 2021-08-23 MED ORDER — ROCURONIUM BROMIDE 100 MG/10ML IV SOLN
INTRAVENOUS | Status: DC | PRN
  Administered 2021-08-23: 60 mg via INTRAVENOUS

## 2021-08-23 MED ORDER — FENTANYL CITRATE (PF) 100 MCG/2ML IJ SOLN
INTRAMUSCULAR | Status: AC
  Filled 2021-08-23: qty 2

## 2021-08-23 MED ORDER — GABAPENTIN 300 MG PO CAPS
ORAL_CAPSULE | ORAL | Status: AC
  Filled 2021-08-23: qty 1

## 2021-08-23 MED ORDER — GABAPENTIN 300 MG PO CAPS
300.0000 mg | ORAL_CAPSULE | Freq: Once | ORAL | Status: AC
  Administered 2021-08-23: 300 mg via ORAL

## 2021-08-23 MED ORDER — FENTANYL CITRATE (PF) 100 MCG/2ML IJ SOLN: 25.0000 ug | INTRAMUSCULAR | Status: DC | PRN

## 2021-08-23 MED ORDER — DEXAMETHASONE SODIUM PHOSPHATE 4 MG/ML IJ SOLN
INTRAMUSCULAR | Status: DC | PRN
  Administered 2021-08-23: 10 mg via INTRAVENOUS

## 2021-08-23 MED ORDER — CEFAZOLIN SODIUM-DEXTROSE 2-4 GM/100ML-% IV SOLN
INTRAVENOUS | Status: AC
  Filled 2021-08-23: qty 100

## 2021-08-23 MED ORDER — ACETAMINOPHEN 500 MG PO TABS
ORAL_TABLET | ORAL | Status: AC
  Filled 2021-08-23: qty 2

## 2021-08-23 MED ORDER — AMISULPRIDE (ANTIEMETIC) 5 MG/2ML IV SOLN: 10.0000 mg | Freq: Once | INTRAVENOUS | Status: DC | PRN

## 2021-08-23 MED ORDER — OXYCODONE HCL 5 MG PO TABS
ORAL_TABLET | ORAL | Status: AC
  Filled 2021-08-23: qty 1

## 2021-08-23 MED ORDER — LACTATED RINGERS IV SOLN
INTRAVENOUS | Status: DC | PRN
  Administered 2021-08-23: 1000 mL

## 2021-08-23 MED ORDER — LIDOCAINE-EPINEPHRINE 1 %-1:100000 IJ SOLN
INTRAMUSCULAR | Status: AC
  Filled 2021-08-23: qty 1

## 2021-08-23 MED ORDER — LIDOCAINE 2% (20 MG/ML) 5 ML SYRINGE
INTRAMUSCULAR | Status: AC
  Filled 2021-08-23: qty 5

## 2021-08-23 MED ORDER — CEFAZOLIN SODIUM-DEXTROSE 2-4 GM/100ML-% IV SOLN
2.0000 g | INTRAVENOUS | Status: AC
  Administered 2021-08-23: 2 g via INTRAVENOUS

## 2021-08-23 MED ORDER — SUGAMMADEX SODIUM 200 MG/2ML IV SOLN
INTRAVENOUS | Status: DC | PRN
  Administered 2021-08-23: 200 mg via INTRAVENOUS

## 2021-08-23 MED ORDER — CHLORHEXIDINE GLUCONATE CLOTH 2 % EX PADS
6.0000 | MEDICATED_PAD | Freq: Once | CUTANEOUS | Status: DC
Start: 2021-08-23 — End: 2021-08-23

## 2021-08-23 MED ORDER — CELECOXIB 200 MG PO CAPS
ORAL_CAPSULE | ORAL | Status: AC
  Filled 2021-08-23: qty 1

## 2021-08-23 MED ORDER — CHLORHEXIDINE GLUCONATE CLOTH 2 % EX PADS: 6.0000 | MEDICATED_PAD | Freq: Once | CUTANEOUS | Status: DC

## 2021-08-23 MED ORDER — DEXAMETHASONE SODIUM PHOSPHATE 10 MG/ML IJ SOLN
INTRAMUSCULAR | Status: AC
  Filled 2021-08-23: qty 1

## 2021-08-23 MED ORDER — TRANEXAMIC ACID-NACL 1000-0.7 MG/100ML-% IV SOLN
INTRAVENOUS | Status: AC
  Filled 2021-08-23: qty 100

## 2021-08-23 MED ORDER — EPINEPHRINE PF 1 MG/ML IJ SOLN
INTRAMUSCULAR | Status: AC
  Filled 2021-08-23: qty 3

## 2021-08-23 MED ORDER — MIDAZOLAM HCL 5 MG/5ML IJ SOLN
INTRAMUSCULAR | Status: DC | PRN
  Administered 2021-08-23: 2 mg via INTRAVENOUS

## 2021-08-23 SURGICAL SUPPLY — 54 items
APL PRP STRL LF DISP 70% ISPRP (MISCELLANEOUS) ×2
APL SKNCLS STERI-STRIP NONHPOA (GAUZE/BANDAGES/DRESSINGS) ×2
BENZOIN TINCTURE PRP APPL 2/3 (GAUZE/BANDAGES/DRESSINGS) ×6 IMPLANT
BLADE SURG 10 STRL SS (BLADE) ×7 IMPLANT
BNDG CMPR MED 10X6 ELC LF (GAUZE/BANDAGES/DRESSINGS) ×1
BNDG ELASTIC 6X10 VLCR STRL LF (GAUZE/BANDAGES/DRESSINGS) ×3 IMPLANT
CANISTER SUCT 1200ML W/VALVE (MISCELLANEOUS) ×3 IMPLANT
CHLORAPREP W/TINT 26 (MISCELLANEOUS) ×6 IMPLANT
COVER BACK TABLE 60X90IN (DRAPES) ×3 IMPLANT
COVER MAYO STAND STRL (DRAPES) ×3 IMPLANT
DRAPE LAPAROSCOPIC ABDOMINAL (DRAPES) ×3 IMPLANT
DRAPE UTILITY XL STRL (DRAPES) ×3 IMPLANT
DRSG PAD ABDOMINAL 8X10 ST (GAUZE/BANDAGES/DRESSINGS) ×12 IMPLANT
ELECT REM PT RETURN 9FT ADLT (ELECTROSURGICAL) ×2
ELECTRODE REM PT RTRN 9FT ADLT (ELECTROSURGICAL) ×2 IMPLANT
GAUZE SPONGE 4X4 12PLY STRL (GAUZE/BANDAGES/DRESSINGS) ×6 IMPLANT
GLOVE SRG 8 PF TXTR STRL LF DI (GLOVE) ×2 IMPLANT
GLOVE SURG ENC MOIS LTX SZ7.5 (GLOVE) ×3 IMPLANT
GLOVE SURG ENC TEXT LTX SZ7.5 (GLOVE) ×3 IMPLANT
GLOVE SURG POLYISO LF SZ6.5 (GLOVE) ×1 IMPLANT
GLOVE SURG UNDER POLY LF SZ6.5 (GLOVE) ×1 IMPLANT
GLOVE SURG UNDER POLY LF SZ8 (GLOVE) ×4
GOWN STRL REUS W/ TWL LRG LVL3 (GOWN DISPOSABLE) ×2 IMPLANT
GOWN STRL REUS W/ TWL XL LVL3 (GOWN DISPOSABLE) ×4 IMPLANT
GOWN STRL REUS W/TWL LRG LVL3 (GOWN DISPOSABLE) ×2
GOWN STRL REUS W/TWL XL LVL3 (GOWN DISPOSABLE) ×4
NDL FILTER BLUNT 18X1 1/2 (NEEDLE) ×2 IMPLANT
NDL HYPO 25X1 1.5 SAFETY (NEEDLE) IMPLANT
NDL SAFETY ECLIPSE 18X1.5 (NEEDLE) ×2 IMPLANT
NDL SPNL 18GX3.5 QUINCKE PK (NEEDLE) ×2 IMPLANT
NEEDLE FILTER BLUNT 18X 1/2SAF (NEEDLE) ×1
NEEDLE FILTER BLUNT 18X1 1/2 (NEEDLE) ×1 IMPLANT
NEEDLE HYPO 18GX1.5 SHARP (NEEDLE) ×2
NEEDLE HYPO 25X1 1.5 SAFETY (NEEDLE) IMPLANT
NEEDLE SPNL 18GX3.5 QUINCKE PK (NEEDLE) ×2 IMPLANT
NS IRRIG 1000ML POUR BTL (IV SOLUTION) ×3 IMPLANT
PACK BASIN DAY SURGERY FS (CUSTOM PROCEDURE TRAY) ×3 IMPLANT
PENCIL SMOKE EVACUATOR (MISCELLANEOUS) ×3 IMPLANT
SLEEVE SCD COMPRESS KNEE MED (STOCKING) ×3 IMPLANT
SPONGE T-LAP 18X18 ~~LOC~~+RFID (SPONGE) ×9 IMPLANT
STAPLER INSORB 30 2030 C-SECTI (MISCELLANEOUS) ×4 IMPLANT
STAPLER VISISTAT 35W (STAPLE) ×3 IMPLANT
STRIP SUTURE WOUND CLOSURE 1/2 (MISCELLANEOUS) ×8 IMPLANT
SUT MNCRL AB 4-0 PS2 18 (SUTURE) ×12 IMPLANT
SUT PDS 3-0 CT2 (SUTURE) ×6
SUT PDS II 3-0 CT2 27 ABS (SUTURE) ×6 IMPLANT
SUT VLOC 180 P-14 24 (SUTURE) ×6 IMPLANT
SYR 50ML LL SCALE MARK (SYRINGE) ×1 IMPLANT
SYR BULB IRRIG 60ML STRL (SYRINGE) ×2 IMPLANT
TOWEL GREEN STERILE FF (TOWEL DISPOSABLE) ×6 IMPLANT
TUBE CONNECTING 20X1/4 (TUBING) ×3 IMPLANT
TUBING INFILTRATION IT-10001 (TUBING) ×3 IMPLANT
UNDERPAD 30X36 HEAVY ABSORB (UNDERPADS AND DIAPERS) ×6 IMPLANT
YANKAUER SUCT BULB TIP NO VENT (SUCTIONS) ×3 IMPLANT

## 2021-08-23 NOTE — Anesthesia Postprocedure Evaluation (Signed)
Anesthesia Post Note  Patient: Karen Booth  Procedure(s) Performed: MAMMARY REDUCTION  (BREAST) (Bilateral: Breast)     Patient location during evaluation: PACU Anesthesia Type: General Level of consciousness: awake and alert Pain management: pain level controlled Vital Signs Assessment: post-procedure vital signs reviewed and stable Respiratory status: spontaneous breathing, nonlabored ventilation, respiratory function stable and patient connected to nasal cannula oxygen Cardiovascular status: blood pressure returned to baseline and stable Postop Assessment: no apparent nausea or vomiting Anesthetic complications: no   No notable events documented.  Last Vitals:  Vitals:   08/23/21 1015 08/23/21 1123  BP: (!) 149/96 (!) 140/93  Pulse: 93 (!) 110  Resp: 18 18  Temp:  36.6 C  SpO2: 97% 96%    Last Pain:  Vitals:   08/23/21 1123  TempSrc: Oral  PainSc: 3                  Tiajuana Amass

## 2021-08-23 NOTE — Discharge Instructions (Addendum)
Activity: Avoid strenuous activity.  No heavy lifting.  Diet: No restrictions.  Try to optimize nutrition with plenty of fruits and vegetables to improve healing.  Wound Care: Leave ACE wrap for 3 days.  You may then remove and shower normally.  After ACE wrap comes off recommend sports bra for gentle compression.  Avoid any bra with under-wire until cleared by Dr. Pace.  Follow-Up: Scheduled for next week.  Things to watch for:  Call the office if you experience fever, chills, persistent nausea, or significant bleeding.  Mild wound drainage is common after breast reduction surgery and should not be cause for alarm.    Post Anesthesia Home Care Instructions  Activity: Get plenty of rest for the remainder of the day. A responsible individual must stay with you for 24 hours following the procedure.  For the next 24 hours, DO NOT: -Drive a car -Operate machinery -Drink alcoholic beverages -Take any medication unless instructed by your physician -Make any legal decisions or sign important papers.  Meals: Start with liquid foods such as gelatin or soup. Progress to regular foods as tolerated. Avoid greasy, spicy, heavy foods. If nausea and/or vomiting occur, drink only clear liquids until the nausea and/or vomiting subsides. Call your physician if vomiting continues.  Special Instructions/Symptoms: Your throat may feel dry or sore from the anesthesia or the breathing tube placed in your throat during surgery. If this causes discomfort, gargle with warm salt water. The discomfort should disappear within 24 hours.  If you had a scopolamine patch placed behind your ear for the management of post- operative nausea and/or vomiting:  1. The medication in the patch is effective for 72 hours, after which it should be removed.  Wrap patch in a tissue and discard in the trash. Wash hands thoroughly with soap and water. 2. You may remove the patch earlier than 72 hours if you experience unpleasant  side effects which may include dry mouth, dizziness or visual disturbances. 3. Avoid touching the patch. Wash your hands with soap and water after contact with the patch.     

## 2021-08-23 NOTE — Anesthesia Procedure Notes (Signed)
Procedure Name: Intubation Date/Time: 08/23/2021 7:56 AM Performed by: Tawni Millers, CRNA Pre-anesthesia Checklist: Patient identified, Emergency Drugs available, Suction available and Patient being monitored Patient Re-evaluated:Patient Re-evaluated prior to induction Oxygen Delivery Method: Circle system utilized Preoxygenation: Pre-oxygenation with 100% oxygen Induction Type: IV induction Ventilation: Mask ventilation without difficulty Laryngoscope Size: Mac and 3 Grade View: Grade I Tube type: Oral Tube size: 7.0 mm Number of attempts: 1 Airway Equipment and Method: Stylet and Oral airway Placement Confirmation: ETT inserted through vocal cords under direct vision, positive ETCO2 and breath sounds checked- equal and bilateral Tube secured with: Tape Dental Injury: Teeth and Oropharynx as per pre-operative assessment

## 2021-08-23 NOTE — Op Note (Signed)
Operative Note   DATE OF OPERATION: 08/23/2021  LOCATION: [facility]   SURGICAL DEPARTMENT: Plastic Surgery  PREOPERATIVE DIAGNOSES: Bilateral symptomatic macromastia.  POSTOPERATIVE DIAGNOSES:  same  PROCEDURE: Bilateral breast reduction with superomedial pedicle.  SURGEON: Ancil Linsey, MD  ASSISTANT: Evelena Leyden, PA The advanced practice practitioner (APP) assisted throughout the case.  The APP was essential in retraction and counter traction when needed to make the case progress smoothly.  This retraction and assistance made it possible to see the tissue planes for the procedure.  The assistance was needed for hemostasis, tissue re-approximation and closure of the incision site.   ANESTHESIA: General.  COMPLICATIONS: None.   INDICATIONS FOR PROCEDURE:  The patient, Karen Booth is a 26 y.o. female born on [birth year], is here for treatment of bilateral symptomatic macromastia. MRN: [id number]  CONSENT:  Informed consent was obtained directly from the patient. Risks, benefits and alternatives were fully discussed. Specific risks including but not limited to bleeding, infection, hematoma, seroma, scarring, pain, infection, contracture, asymmetry, wound healing problems, and need for further surgery were all discussed. The patient did have an ample opportunity to have questions answered to satisfaction.   DESCRIPTION OF PROCEDURE:  The patient was marked preoperatively for a Wise pattern skin excision.  The patient was taken to the operating room. SCDs were placed and antibiotics were given. General anesthesia was administered.The patient's operative site was prepped and draped in a sterile fashion. A time out was performed and all information was confirmed to be correct.  Right Breast: The breast was infiltrated with tumescent solution to help with hemostasis.  The nipple was marked with a cookie cutter.  A superomedial pedicle was drawn out with the base of at  least 8 cm in size.  A breast tourniquet was then applied and the pedicle was de-epithelialized.  Breast tourniquet was then let down and all incisions were made with a 10 blade.  The pedicle was then isolated down to the chest wall with cautery and the excision was performed removing tissue primarily inferiorly and laterally.  Hemostasis was obtained and the wound was stapled closed.  Left breast:  The breast was infiltrated with tumescent solution to help with hemostasis.  The nipple was marked with a cookie cutter.  A superomedial pedicle was drawn out with the base of at least 8 cm in size.  A breast tourniquet was then applied and the pedicle was de-epithelialized.  Breast tourniquet was then let down and all incisions were made with a 10 blade.  The pedicle was then isolated down to the chest wall with cautery and the excision was performed removing tissue primarily inferiorly and laterally.  Hemostasis was obtained and the wound was stapled closed.  Patient was then set up to check for size and symmetry.  Minor modifications were made.  This resulted in a total of 878g removed from the right side and 835g removed from the left side.  The inframammary incision was closed with a combination of buried in-sorb staples and a running 3-0 Quill suture.  The vertical and periareolar limbs were closed with interrupted buried 4-0 Monocryl and a running 4-0 Quill suture.  Steri-Strips were then applied along with a soft dressing and Ace wrap.  The patient tolerated the procedure well.  There were no complications. The patient was allowed to wake from anesthesia, extubated and taken to the recovery room in satisfactory condition.  I was present for the entire procedure.

## 2021-08-23 NOTE — Interval H&P Note (Signed)
Patient seen in examine. Risks and benefits discussed. Proceed with surgery.

## 2021-08-23 NOTE — Anesthesia Preprocedure Evaluation (Addendum)
Anesthesia Evaluation  Patient identified by MRN, date of birth, ID band Patient awake    Reviewed: Allergy & Precautions, NPO status , Patient's Chart, lab work & pertinent test results  Airway Mallampati: II  TM Distance: >3 FB Neck ROM: Full    Dental  (+) Dental Advisory Given   Pulmonary former smoker,    breath sounds clear to auscultation       Cardiovascular hypertension, Pt. on medications  Rhythm:Regular Rate:Normal     Neuro/Psych negative neurological ROS     GI/Hepatic negative GI ROS, Neg liver ROS,   Endo/Other  negative endocrine ROS  Renal/GU negative Renal ROS     Musculoskeletal   Abdominal   Peds  Hematology negative hematology ROS (+)   Anesthesia Other Findings   Reproductive/Obstetrics                             Anesthesia Physical Anesthesia Plan  ASA: 2  Anesthesia Plan: General   Post-op Pain Management: Tylenol PO (pre-op), Minimal or no pain anticipated and Gabapentin PO (pre-op)   Induction: Intravenous  PONV Risk Score and Plan: 3 and Midazolam, Dexamethasone, Ondansetron and Treatment may vary due to age or medical condition  Airway Management Planned: Oral ETT  Additional Equipment: None  Intra-op Plan:   Post-operative Plan: Extubation in OR  Informed Consent: I have reviewed the patients History and Physical, chart, labs and discussed the procedure including the risks, benefits and alternatives for the proposed anesthesia with the patient or authorized representative who has indicated his/her understanding and acceptance.     Dental advisory given  Plan Discussed with: CRNA  Anesthesia Plan Comments:        Anesthesia Quick Evaluation

## 2021-08-23 NOTE — Transfer of Care (Signed)
Immediate Anesthesia Transfer of Care Note  Patient: Karen Booth  Procedure(s) Performed: MAMMARY REDUCTION  (BREAST) (Bilateral: Breast)  Patient Location: PACU  Anesthesia Type:General  Level of Consciousness: awake  Airway & Oxygen Therapy: Patient Spontanous Breathing and Patient connected to face mask oxygen  Post-op Assessment: Report given to RN and Post -op Vital signs reviewed and stable  Post vital signs: Reviewed and stable  Last Vitals:  Vitals Value Taken Time  BP    Temp    Pulse 120 08/23/21 0946  Resp    SpO2 100 % 08/23/21 0946  Vitals shown include unvalidated device data.  Last Pain:  Vitals:   08/23/21 1610  TempSrc: Oral      Patients Stated Pain Goal: 4 (08/23/21 9604)  Complications: No notable events documented.

## 2021-08-26 ENCOUNTER — Encounter (HOSPITAL_BASED_OUTPATIENT_CLINIC_OR_DEPARTMENT_OTHER): Payer: Self-pay | Admitting: Plastic Surgery

## 2021-08-26 LAB — SURGICAL PATHOLOGY

## 2021-08-28 ENCOUNTER — Ambulatory Visit (INDEPENDENT_AMBULATORY_CARE_PROVIDER_SITE_OTHER): Payer: Medicaid Other | Admitting: Plastic Surgery

## 2021-08-28 ENCOUNTER — Other Ambulatory Visit: Payer: Self-pay

## 2021-08-28 DIAGNOSIS — N62 Hypertrophy of breast: Secondary | ICD-10-CM

## 2021-08-28 NOTE — Progress Notes (Signed)
Patient presents about 1 week out from bilateral breast reduction.  She feels good and is happy.  On exam everything looks to be healing fine with intact incisions and viable nipple areolar complexes.  There is no subcutaneous fluid.  I have asked her to continue compressive garments and avoid strenuous activity.  We will see her again in a few weeks.  

## 2021-09-09 NOTE — Progress Notes (Deleted)
Patient is a 26 year old female with PMH of macromastia s/p bilateral breast reduction performed 08/23/2021 by Dr. Arita Miss who presents to clinic for postoperative follow-up.  Patient was seen for initial postop 08/28/2021.  At that time, exam was reassuring.  Recommended continued compressive garments and activity modifications.  Today,

## 2021-09-12 ENCOUNTER — Encounter: Payer: Medicaid Other | Admitting: Physician Assistant

## 2021-09-30 NOTE — Progress Notes (Signed)
Patient is a pleasant 26 year old female with PMH of macromastia s/p bilateral breast reduction performed 08/23/2021 by Dr. Arita Miss who presents clinic for postoperative follow-up.  She was last seen here in the office on 08/28/2021.  At that time, she was healing nicely and there were no concerning physical exam findings.  Plan is for continued compressive garments and activity modification.  Today, patient is accompanied by her daughter at bedside.  She denies any pain, redness, wounds, or other complaints.  She reports that some of the Steri-Strips have fallen off spontaneously, but a couple remain on.  She inquires about when she can resume normal activity without any restrictions.  Physical exam is entirely reassuring.  Steri-Strips removed without complication or difficulty.  Good symmetry and shape.  No wounds noted.  No areas of redness.  No subcutaneous fluid collection.  She has healed nicely from her surgery.  Discussed continued avoidance of submerging her breasts in hot tub/pool/ocean for a couple more months, but otherwise all restrictions lifted.  She can resume normal activity and begin exercises at the gym, as tolerated.  She understands that she is likely deconditioned due to postoperative recovery.  We also discussed scar creams.  Given that this is a new scar, recommend that they apply Mederma once daily at night x8 weeks. Picture(s) obtained of the patient and placed in the chart were with the patient's or guardian's permission.

## 2021-10-02 ENCOUNTER — Other Ambulatory Visit: Payer: Self-pay

## 2021-10-02 ENCOUNTER — Ambulatory Visit (INDEPENDENT_AMBULATORY_CARE_PROVIDER_SITE_OTHER): Payer: Medicaid Other | Admitting: Physician Assistant

## 2021-10-02 DIAGNOSIS — Z9889 Other specified postprocedural states: Secondary | ICD-10-CM

## 2022-09-22 ENCOUNTER — Emergency Department (HOSPITAL_COMMUNITY)
Admission: EM | Admit: 2022-09-22 | Discharge: 2022-09-23 | Discharge status: Against Medical Advice | Payer: Medicaid Other | Attending: Emergency Medicine | Admitting: Emergency Medicine

## 2022-09-22 ENCOUNTER — Other Ambulatory Visit: Payer: Self-pay

## 2022-09-22 ENCOUNTER — Emergency Department (HOSPITAL_COMMUNITY): Payer: Medicaid Other

## 2022-09-22 DIAGNOSIS — N938 Other specified abnormal uterine and vaginal bleeding: Secondary | ICD-10-CM | POA: Insufficient documentation

## 2022-09-22 DIAGNOSIS — Z1152 Encounter for screening for COVID-19: Secondary | ICD-10-CM | POA: Diagnosis not present

## 2022-09-22 DIAGNOSIS — N939 Abnormal uterine and vaginal bleeding, unspecified: Secondary | ICD-10-CM

## 2022-09-22 DIAGNOSIS — J069 Acute upper respiratory infection, unspecified: Secondary | ICD-10-CM | POA: Insufficient documentation

## 2022-09-22 DIAGNOSIS — R509 Fever, unspecified: Secondary | ICD-10-CM | POA: Diagnosis present

## 2022-09-22 LAB — WET PREP, GENITAL
Sperm: NONE SEEN
Trich, Wet Prep: NONE SEEN
WBC, Wet Prep HPF POC: <10 (ref <10)
Yeast Wet Prep HPF POC: NONE SEEN

## 2022-09-22 LAB — CBC
HCT: 44.8 % (ref 36.0–46.0)
Hemoglobin: 14.3 g/dL (ref 12.0–15.0)
MCH: 26.8 pg (ref 26.0–34.0)
MCHC: 31.9 g/dL (ref 30.0–36.0)
MCV: 84.1 fL (ref 80.0–100.0)
Platelets: 248 10*3/uL (ref 150–400)
RBC: 5.33 MIL/uL — ABNORMAL HIGH (ref 3.87–5.11)
RDW: 14.3 % (ref 11.5–15.5)
WBC: 8.9 10*3/uL (ref 4.0–10.5)
nRBC: 0 % (ref 0.0–0.2)

## 2022-09-22 LAB — RESP PANEL BY RT-PCR (RSV, FLU A&B, COVID)  RVPGX2
Influenza A by PCR: NEGATIVE
Influenza B by PCR: NEGATIVE
Resp Syncytial Virus by PCR: NEGATIVE
SARS Coronavirus 2 by RT PCR: NEGATIVE

## 2022-09-22 LAB — PREGNANCY, URINE: Preg Test, Ur: NEGATIVE

## 2022-09-22 MED ORDER — ACETAMINOPHEN 325 MG PO TABS
650.0000 mg | ORAL_TABLET | Freq: Once | ORAL | Status: AC
  Administered 2022-09-22: 650 mg via ORAL
  Filled 2022-09-22: qty 2

## 2022-09-22 NOTE — ED Notes (Signed)
Pelvic cart at bedside and pt is getting undressed from the waist down at this time.

## 2022-09-22 NOTE — ED Notes (Signed)
Pt to restroom to get urine sample.

## 2022-09-22 NOTE — ED Triage Notes (Signed)
Pt via POV c/o vaginal bleeding x 2 weeks plus fever, body aches, nasal congestion, cough. Her daughter was sick about a week ago. Pt had nexplanon implant removed almost a year ago and has had irregular periods since removal. Pain rated 6/10, body aches and cramping.

## 2022-09-22 NOTE — ED Provider Notes (Signed)
[facility] Provider Note   CSN: [id number] Arrival date & time: 09/22/22  1745     History {Add pertinent medical, surgical, social history, OB history to HPI:1} Chief Complaint  Patient presents with   Vaginal Bleeding   Fever    Karen Booth is a 27 y.o. female.   Vaginal Bleeding Associated symptoms: fever   Fever Patient presenting for 2 weeks of vaginal bleeding in addition to fevers, body aches, congestion, and cough.  Medical history includes depression, anxiety, HTN, prior STIs.***.     Home Medications Prior to Admission medications   Medication Sig Start Date End Date Taking? Authorizing Provider  amLODipine (NORVASC) 5 MG tablet Take 5 mg by mouth daily. 03/30/21   [provider]  ondansetron (ZOFRAN) 4 MG tablet Take 1 tablet (4 mg total) by mouth every 8 (eight) hours as needed for nausea or vomiting. 08/01/21   Scheeler, Carola Rhine, PA-C  rosuvastatin (CRESTOR) 10 MG tablet Take 10 mg by mouth at bedtime. 03/30/21   [provider]  labetalol (NORMODYNE) 300 MG tablet Take 2 tablets (600 mg total) by mouth 2 (two) times daily. Patient not taking: Reported on 02/22/2019 12/07/18 06/30/19  Starr Lake, CNM  lisinopril (ZESTRIL) 20 MG tablet Take 1 tablet (20 mg total) by mouth 2 (two) times a day. Patient not taking: Reported on 02/22/2019 12/07/18 06/30/19  Starr Lake, CNM  NIFEdipine (PROCARDIA XL) 60 MG 24 hr tablet Take 1 tablet (60 mg total) by mouth daily. Patient not taking: Reported on 12/29/2018 12/21/18 06/30/19  Scot Jun, NP  sertraline (ZOLOFT) 50 MG tablet Take 1 tablet (50 mg total) by mouth daily. Patient not taking: Reported on 02/22/2019 11/13/18 06/30/19  Woodroe Mode, MD      Allergies    Ibuprofen    Review of Systems   Review of Systems  Constitutional:  Positive for fever.  Genitourinary:  Positive for vaginal bleeding.    Physical  Exam Updated Vital Signs BP (!) 146/100   Pulse 97   Temp 98.6 F (37 C) (Oral)   Resp 20   Ht 5' 1"$  (1.549 m)   Wt 86.2 kg   SpO2 99%   BMI 35.90 kg/m  Physical Exam  ED Results / Procedures / Treatments   Labs (all labs ordered are listed, but only abnormal results are displayed) Labs Reviewed  CBC - Abnormal; Notable for the following components:      Result Value   RBC 5.33 (*)    All other components within normal limits  RESP PANEL BY RT-PCR (RSV, FLU A&B, COVID)  RVPGX2  POC URINE PREG, ED    EKG None  Radiology No results found.  Procedures Procedures  {Document cardiac monitor, telemetry assessment procedure when appropriate:1}  Medications Ordered in ED Medications - No data to display  ED Course/ Medical Decision Making/ A&P   {   Click here for ABCD2, HEART and other calculatorsREFRESH Note before signing :1}                          Medical Decision Making Amount and/or Complexity of Data Reviewed Labs: ordered.   ***  {Document critical care time when appropriate:1} {Document review of labs and clinical decision tools ie heart score, Chads2Vasc2 etc:1}  {Document your independent review of radiology images, and any outside records:1} {Document your discussion with family members, caretakers, and with consultants:1} {Document  social determinants of health affecting pt's care:1} {Document your decision making why or why not admission, treatments were needed:1} Final Clinical Impression(s) / ED Diagnoses Final diagnoses:  None    Rx / DC Orders ED Discharge Orders     None

## 2022-09-22 NOTE — ED Notes (Signed)
Pt does not want CT and labs. EDP message  of the same.

## 2022-09-23 NOTE — ED Notes (Signed)
Pt states she cannot wait any longer and will check her results on mychart.

## 2022-09-24 LAB — GC/CHLAMYDIA PROBE AMP (~~LOC~~) NOT AT ARMC
Chlamydia: NEGATIVE
Comment: NEGATIVE
Comment: NORMAL
Neisseria Gonorrhea: NEGATIVE

## 2023-03-09 ENCOUNTER — Inpatient Hospital Stay (HOSPITAL_COMMUNITY)
Admission: AD | Admit: 2023-03-09 | Discharge: 2023-03-10 | Disposition: A | Discharge status: Home / Self Care | Payer: PRIVATE HEALTH INSURANCE | Attending: Obstetrics and Gynecology | Admitting: Obstetrics and Gynecology

## 2023-03-09 ENCOUNTER — Encounter (HOSPITAL_COMMUNITY): Payer: Self-pay | Admitting: Obstetrics and Gynecology

## 2023-03-09 DIAGNOSIS — Z3A11 11 weeks gestation of pregnancy: Secondary | ICD-10-CM | POA: Insufficient documentation

## 2023-03-09 DIAGNOSIS — O99611 Diseases of the digestive system complicating pregnancy, first trimester: Secondary | ICD-10-CM | POA: Insufficient documentation

## 2023-03-09 DIAGNOSIS — K117 Disturbances of salivary secretion: Secondary | ICD-10-CM | POA: Insufficient documentation

## 2023-03-09 DIAGNOSIS — O219 Vomiting of pregnancy, unspecified: Secondary | ICD-10-CM | POA: Insufficient documentation

## 2023-03-09 MED ORDER — SODIUM CHLORIDE 0.9 % IV SOLN
12.5000 mg | Freq: Once | INTRAVENOUS | Status: AC
  Administered 2023-03-10: 12.5 mg via INTRAVENOUS
  Filled 2023-03-09: qty 0.5

## 2023-03-09 MED ORDER — LACTATED RINGERS IV BOLUS
1000.0000 mL | Freq: Once | INTRAVENOUS | Status: AC
  Administered 2023-03-10: 1000 mL via INTRAVENOUS

## 2023-03-09 MED ORDER — FAMOTIDINE IN NACL 20-0.9 MG/50ML-% IV SOLN
20.0000 mg | Freq: Once | INTRAVENOUS | Status: AC
  Administered 2023-03-10: 20 mg via INTRAVENOUS
  Filled 2023-03-09: qty 50

## 2023-03-09 MED ORDER — GLYCOPYRROLATE 0.2 MG/ML IJ SOLN
0.2000 mg | Freq: Once | INTRAMUSCULAR | Status: AC
  Administered 2023-03-10: 0.2 mg via INTRAVENOUS
  Filled 2023-03-09: qty 1

## 2023-03-09 MED ORDER — SCOPOLAMINE 1 MG/3DAYS TD PT72
1.0000 | MEDICATED_PATCH | TRANSDERMAL | Status: DC
  Administered 2023-03-10: 1.5 mg via TRANSDERMAL
  Filled 2023-03-09: qty 1

## 2023-03-09 NOTE — MAU Note (Signed)
.  Karen Booth is a 27 y.o. at Unknown here in MAU reporting: N/V and spitting that has been ongoing but increased yesterday, reports at least 20 episodes of emesis. Pt states she can not keep anything down, has attempted to eat and drink. Pt states she has taking her Zofran every 6 hours, not effective. PT denies VB or LOF. CHTN on Labetalol 200mg  bid, last dose yesterday am.  EDC: 09/26/2023 via [place] Onset of complaint: yesterday  Pain score: 0/10 Vitals:   03/09/23 2300  BP: (!) 133/93  Pulse: (!) 120  Resp: 18  SpO2: 96%     FHT:RN attempted, unable to assess  Lab orders placed from triage:

## 2023-03-10 DIAGNOSIS — O219 Vomiting of pregnancy, unspecified: Secondary | ICD-10-CM | POA: Diagnosis not present

## 2023-03-10 DIAGNOSIS — Z3A11 11 weeks gestation of pregnancy: Secondary | ICD-10-CM

## 2023-03-10 DIAGNOSIS — R000 Tachycardia, unspecified: Secondary | ICD-10-CM | POA: Diagnosis present

## 2023-03-10 DIAGNOSIS — R12 Heartburn: Secondary | ICD-10-CM | POA: Diagnosis present

## 2023-03-10 DIAGNOSIS — K117 Disturbances of salivary secretion: Secondary | ICD-10-CM | POA: Diagnosis not present

## 2023-03-10 DIAGNOSIS — R112 Nausea with vomiting, unspecified: Secondary | ICD-10-CM | POA: Diagnosis present

## 2023-03-10 DIAGNOSIS — O99611 Diseases of the digestive system complicating pregnancy, first trimester: Secondary | ICD-10-CM | POA: Diagnosis not present

## 2023-03-10 MED ORDER — SCOPOLAMINE 1 MG/3DAYS TD PT72: 1.0000 | MEDICATED_PATCH | TRANSDERMAL | 12 refills | Status: AC

## 2023-03-10 MED ORDER — GLYCOPYRROLATE 1 MG PO TABS: 1.0000 mg | ORAL_TABLET | Freq: Three times a day (TID) | ORAL | 0 refills | Status: AC

## 2023-03-10 MED ORDER — PROMETHAZINE HCL 25 MG PO TABS: 25.0000 mg | ORAL_TABLET | Freq: Four times a day (QID) | ORAL | 0 refills | Status: AC | PRN

## 2023-03-10 MED ORDER — FAMOTIDINE 20 MG PO TABS: 20.0000 mg | ORAL_TABLET | Freq: Two times a day (BID) | ORAL | 0 refills | Status: AC

## 2023-03-10 NOTE — MAU Provider Note (Signed)
History     CSN: [id number]  Arrival date and time: 03/09/23 2153   Event Date/Time   First Provider Initiated Contact with Patient 03/09/23 2351      Chief Complaint  Patient presents with   Emesis   Nausea   HPI Karen Booth is a 27 y.o. G3P1102 at [redacted]w[redacted]d who presents to MAU for nausea and vomiting. She reports nonstop vomiting since yesterday. She reports she has vomited >20 times in the past 24 hours and she is not able to keep anything down. She has been taking Zofran without relief. She also reports heartburn and spitting. She denies fever, chills, diarrhea, or urinary s/s. No abdominal pain or vaginal bleeding. She reports she receives [facility] in [place], next appointment is 8/19.  OB History     Gravida  3   Para  2   Term  1   Preterm  1   AB      Living  2      SAB      IAB      Ectopic      Multiple      Live Births  1           Past Medical History:  Diagnosis Date   Anxiety    never discussed or been treated, 'always had it, been coming up lately"   Chlamydia    Heart murmur    Hypertension    Infection    UTI   Urinary tract infection     Past Surgical History:  Procedure Laterality Date   ADENOIDECTOMY     BREAST REDUCTION SURGERY Bilateral 08/23/2021   Procedure: MAMMARY REDUCTION  (BREAST);  Surgeon: Allena Napoleon, MD;  Location: [facility];  Service: Plastics;  Laterality: Bilateral;   TONSILLECTOMY      Family History  Problem Relation Age of Onset   Heart disease Mother    Hypertension Mother    Heart murmur Mother    Arrhythmia Mother    Hypertension Father    Breast cancer Maternal Grandmother    Alzheimer's disease Maternal Grandmother    Breast cancer Paternal Grandmother    Esophageal cancer Maternal Uncle    Diabetes Paternal Aunt    Stomach cancer Maternal Uncle    Heart disease Maternal Grandfather    Hypertension Maternal Grandfather    Stroke Maternal  Grandfather    Breast cancer Maternal Great-grandmother    Hyperlipidemia Maternal Great-grandfather    Stroke Maternal Great-grandfather    Hearing loss Neg Hx     Social History   Tobacco Use   Smoking status: Former    Types: Cigarettes   Smokeless tobacco: Never   Tobacco comments:    quit  before preg  Vaping Use   Vaping status: Never Used  Substance Use Topics   Alcohol use: No   Drug use: Not Currently    Types: Marijuana    Comment: last use 30 May 2018    Allergies:  Allergies  Allergen Reactions   Ibuprofen Anaphylaxis, Hives and Swelling    Oral swelling    No medications prior to admission.   Review of Systems  Gastrointestinal:  Positive for nausea and vomiting.       Heartburn   All other systems reviewed and are negative.  Physical Exam   Patient Vitals for the past 24 hrs:  BP Temp src Pulse Resp SpO2 Height Weight  03/10/23 0226 125/89 -- -- --  98 % -- --  03/10/23 0120 -- -- -- -- 100 % -- --  03/09/23 2330 128/79 -- (!) 109 18 -- -- --  03/09/23 2300 (!) 133/93 Oral (!) 120 18 96 % 5' 1.5" (1.562 m) 85.7 kg   Physical Exam Vitals and nursing note reviewed.  Constitutional:      General: She is not in acute distress. HENT:     Mouth/Throat:     Mouth: Mucous membranes are moist.  Eyes:     Extraocular Movements: Extraocular movements intact.     Pupils: Pupils are equal, round, and reactive to light.  Cardiovascular:     Rate and Rhythm: Tachycardia present.  Pulmonary:     Effort: Pulmonary effort is normal.  Abdominal:     Palpations: Abdomen is soft.     Tenderness: There is no abdominal tenderness.  Musculoskeletal:        General: Normal range of motion.     Cervical back: Normal range of motion.  Skin:    General: Skin is warm and dry.  Neurological:     General: No focal deficit present.     Mental Status: She is alert and oriented to person, place, and time.  Psychiatric:        Mood and Affect: Mood normal.         Behavior: Behavior normal.    Results for orders placed or performed during the hospital encounter of 03/09/23 (from the past 24 hour(s))  CBC     Status: Abnormal   Collection Time: 03/10/23 12:17 AM  Result Value Ref Range   WBC 10.8 (H) 4.0 - 10.5 K/uL   RBC 4.74 3.87 - 5.11 MIL/uL   Hemoglobin 12.9 12.0 - 15.0 g/dL   HCT 82.9 56.2 - 13.0 %   MCV 81.2 80.0 - 100.0 fL   MCH 27.2 26.0 - 34.0 pg   MCHC 33.5 30.0 - 36.0 g/dL   RDW 86.5 (H) 78.4 - 69.6 %   Platelets 243 150 - 400 K/uL   nRBC 0.0 0.0 - 0.2 %  Comprehensive metabolic panel     Status: Abnormal   Collection Time: 03/10/23 12:17 AM  Result Value Ref Range   Sodium 135 135 - 145 mmol/L   Potassium 3.9 3.5 - 5.1 mmol/L   Chloride 100 98 - 111 mmol/L   CO2 26 22 - 32 mmol/L   Glucose, Bld 108 (H) 70 - 99 mg/dL   BUN 13 6 - 20 mg/dL   Creatinine, Ser 2.95 0.44 - 1.00 mg/dL   Calcium 9.6 8.9 - 28.4 mg/dL   Total Protein 7.9 6.5 - 8.1 g/dL   Albumin 4.1 3.5 - 5.0 g/dL   AST 16 15 - 41 U/L   ALT 20 0 - 44 U/L   Alkaline Phosphatase 57 38 - 126 U/L   Total Bilirubin 0.7 0.3 - 1.2 mg/dL   GFR, Estimated >13 >24 mL/min   Anion gap 9 5 - 15  Urinalysis, Routine w reflex microscopic -Urine, Clean Catch     Status: Abnormal   Collection Time: 03/10/23 12:20 AM  Result Value Ref Range   Color, Urine AMBER (A) YELLOW   APPearance CLOUDY (A) CLEAR   Specific Gravity, Urine 1.023 1.005 - 1.030   pH 6.0 5.0 - 8.0   Glucose, UA NEGATIVE NEGATIVE mg/dL   Hgb urine dipstick SMALL (A) NEGATIVE   Bilirubin Urine NEGATIVE NEGATIVE   Ketones, ur 5 (A) NEGATIVE mg/dL   Protein, ur  100 (A) NEGATIVE mg/dL   Nitrite NEGATIVE NEGATIVE   Leukocytes,Ua TRACE (A) NEGATIVE   RBC / HPF 0-5 0 - 5 RBC/hpf   WBC, UA 6-10 0 - 5 WBC/hpf   Bacteria, UA FEW (A) NONE SEEN   Squamous Epithelial / HPF >50 0 - 5 /HPF   Mucus PRESENT     MAU Course  Procedures  MDM UA, culture CBC, CMP LR bolus, Phenergan, Pepcid, Robinul,  Scopolamine  Labs unremarkable. No significant leukocytosis or electrolyte abnormalities. Mild ketonuria. LR bolus with IV anti-emetics given. Patient feeling better on reassessment without additional episodes of vomiting.   Pt informed that the ultrasound is considered a limited OB ultrasound and is not intended to be a complete ultrasound exam.  Patient also informed that the ultrasound is not being completed with the intent of assessing for fetal or placental anomalies or any pelvic abnormalities.  Explained that the purpose of today's ultrasound is to assess for  viability.  Patient acknowledges the purpose of the exam and the limitations of the study.  FHR 150 bpm  Assessment and Plan   1. [redacted] weeks gestation of pregnancy   2. Nausea and vomiting during pregnancy prior to [redacted] weeks gestation   3. Ptyalism    - Discharge home in stable condition - Rx for Phenergan, Robinul, Pepcid, and Scopolamine - Return precautions given. Return to MAU as needed - Keep OB appointment as scheduled  Brand Males, CNM 03/10/2023, 5:55 AM

## 2023-05-04 ENCOUNTER — Encounter (HOSPITAL_COMMUNITY): Payer: Self-pay | Admitting: Obstetrics and Gynecology

## 2023-05-04 ENCOUNTER — Inpatient Hospital Stay (HOSPITAL_COMMUNITY): Payer: PRIVATE HEALTH INSURANCE

## 2023-05-04 ENCOUNTER — Inpatient Hospital Stay (HOSPITAL_COMMUNITY)
Admission: AD | Admit: 2023-05-04 | Discharge: 2023-05-04 | Disposition: A | Discharge status: Home / Self Care | Payer: PRIVATE HEALTH INSURANCE | Attending: Obstetrics and Gynecology | Admitting: Obstetrics and Gynecology

## 2023-05-04 DIAGNOSIS — O0992 Supervision of high risk pregnancy, unspecified, second trimester: Secondary | ICD-10-CM | POA: Insufficient documentation

## 2023-05-04 DIAGNOSIS — O26892 Other specified pregnancy related conditions, second trimester: Secondary | ICD-10-CM | POA: Diagnosis not present

## 2023-05-04 DIAGNOSIS — R079 Chest pain, unspecified: Secondary | ICD-10-CM

## 2023-05-04 DIAGNOSIS — O99342 Other mental disorders complicating pregnancy, second trimester: Secondary | ICD-10-CM | POA: Diagnosis not present

## 2023-05-04 DIAGNOSIS — F419 Anxiety disorder, unspecified: Secondary | ICD-10-CM | POA: Insufficient documentation

## 2023-05-04 DIAGNOSIS — R0602 Shortness of breath: Secondary | ICD-10-CM | POA: Diagnosis present

## 2023-05-04 DIAGNOSIS — R0789 Other chest pain: Secondary | ICD-10-CM | POA: Insufficient documentation

## 2023-05-04 DIAGNOSIS — Z3A19 19 weeks gestation of pregnancy: Secondary | ICD-10-CM | POA: Insufficient documentation

## 2023-05-04 DIAGNOSIS — R002 Palpitations: Secondary | ICD-10-CM | POA: Diagnosis present

## 2023-05-04 DIAGNOSIS — O10012 Pre-existing essential hypertension complicating pregnancy, second trimester: Secondary | ICD-10-CM | POA: Insufficient documentation

## 2023-05-04 LAB — URINALYSIS, ROUTINE W REFLEX MICROSCOPIC
Bilirubin Urine: NEGATIVE
Glucose, UA: 50 mg/dL — AB
Ketones, ur: 5 mg/dL — AB
Leukocytes,Ua: NEGATIVE
Nitrite: NEGATIVE
Protein, ur: 30 mg/dL — AB
Specific Gravity, Urine: 1.025 (ref 1.005–1.030)
pH: 6 (ref 5.0–8.0)

## 2023-05-04 NOTE — Discharge Instructions (Signed)
[facility] [employer] for [employer] at [employer] [facility]             [address], [place], [place] [address] [phone]  Center for [facility] at [facility]                                                             [address], [address], [place], [place], [address] [phone]  Center for [facility] at [facility] [address], [place], [place], [address] [phone]  Center for [facility] at [facility] [address], [place], [place], [address] [phone]  Center for [facility] at [facility]                                 [address] [place], [place], [place], [address] [phone]  Center for [facility]                                    [address] [place], [place], [address] [phone]  Center for [facility] at [facility] [address], [address], [place], [place], [address]                              [facility] [address], [address], [place], [place], [address] [phone]  [facility]         Phone: [phone]  [facility]      Phone: [phone]   [facility]      Phone: [phone]  [facility]         Phone: [phone]   [facility]-M[facility]    Phone: [phone]  [facility]      Phone: [phone]  [facility]     Phone: [phone]

## 2023-05-04 NOTE — MAU Note (Signed)
XRay tech at bedside.

## 2023-05-04 NOTE — MAU Provider Note (Addendum)
History     CSN: [id number]  Arrival date and time: 05/04/23 1105   Event Date/Time   First Provider Initiated Contact with Patient 05/04/23 1230      Chief Complaint  Patient presents with   Palpitations   Shortness of Breath   HPI [name] is a 27 y.o. year old G96P1102 female at [redacted]w[redacted]d weeks gestation who presents to MAU via EMS reporting palpitations and SOB x 2 months that worsened today. She reports those symptoms worsened today. While she was at work today she had her BP taken her BP was "beautiful" pulse rate was 107 while seated and 120s standing. She reports chest after not being able to find a park after arrival to work. She admit the chest pain only lasted 1 minute and she believes it was brought on by "frustration." She reported these symptoms a few weeks ago to her OB and was advised to increase her water intake; which she has been doing. Her high risk pregnancy is complicated by cHTN on Labetalol 200 mg BID. She receives [facility] with [facility] in [place], [place]; next appt is 05/08/2023.   OB History     Gravida  3   Para  2   Term  1   Preterm  1   AB      Living  2      SAB      IAB      Ectopic      Multiple      Live Births  1           Past Medical History:  Diagnosis Date   Anxiety    never discussed or been treated, 'always had it, been coming up lately"   Chlamydia    Heart murmur    Hypertension    Infection    UTI   Urinary tract infection     Past Surgical History:  Procedure Laterality Date   ADENOIDECTOMY     BREAST REDUCTION SURGERY Bilateral 08/23/2021   Procedure: MAMMARY REDUCTION  (BREAST);  Surgeon: Allena Napoleon, MD;  Location: [facility];  Service: Plastics;  Laterality: Bilateral;   TONSILLECTOMY      Family History  Problem Relation Age of Onset   Heart disease Mother    Hypertension Mother    Heart murmur Mother    Arrhythmia Mother    Hypertension Father    Breast cancer Maternal  Grandmother    Alzheimer's disease Maternal Grandmother    Breast cancer Paternal Grandmother    Esophageal cancer Maternal Uncle    Diabetes Paternal Aunt    Stomach cancer Maternal Uncle    Heart disease Maternal Grandfather    Hypertension Maternal Grandfather    Stroke Maternal Grandfather    Breast cancer Maternal Great-grandmother    Hyperlipidemia Maternal Great-grandfather    Stroke Maternal Great-grandfather    Hearing loss Neg Hx     Social History   Tobacco Use   Smoking status: Former    Types: Cigarettes   Smokeless tobacco: Never   Tobacco comments:    quit  before preg  Vaping Use   Vaping status: Never Used  Substance Use Topics   Alcohol use: No   Drug use: Not Currently    Types: Marijuana    Comment: last use 30 May 2018    Allergies:  Allergies  Allergen Reactions   Ibuprofen Anaphylaxis, Hives and Swelling    Oral swelling    Medications  Prior to Admission  Medication Sig Dispense Refill Last Dose   famotidine (PEPCID) 20 MG tablet Take 1 tablet (20 mg total) by mouth 2 (two) times daily. 30 tablet 0 05/04/2023   labetalol (NORMODYNE) 200 MG tablet Take 200 mg by mouth 2 (two) times daily.   05/04/2023   ondansetron (ZOFRAN) 4 MG tablet Take 1 tablet (4 mg total) by mouth every 8 (eight) hours as needed for nausea or vomiting. 20 tablet 0 Past Week   scopolamine (TRANSDERM-SCOP) 1 MG/3DAYS Place 1 patch (1.5 mg total) onto the skin every 3 (three) days. 10 patch 12 05/04/2023   amLODipine (NORVASC) 5 MG tablet Take 5 mg by mouth daily.      glycopyrrolate (ROBINUL) 1 MG tablet Take 1 tablet (1 mg total) by mouth 3 (three) times daily. 90 tablet 0    promethazine (PHENERGAN) 25 MG tablet Take 1 tablet (25 mg total) by mouth every 6 (six) hours as needed for nausea or vomiting. 30 tablet 0     Review of Systems  Constitutional: Negative.   HENT: Negative.    Eyes: Negative.   Respiratory:  Positive for shortness of breath.   Cardiovascular:   Positive for palpitations.  Gastrointestinal: Negative.   Endocrine: Negative.   Genitourinary: Negative.   Musculoskeletal: Negative.   Skin: Negative.   Allergic/Immunologic: Negative.   Neurological: Negative.   Hematological: Negative.   Psychiatric/Behavioral: Negative.     Physical Exam   Blood pressure 114/75, pulse 88, temperature 98.8 F (37.1 C), temperature source Oral, resp. rate 20, height 5\' 1"  (1.549 m), weight 90.2 kg, SpO2 99%.  Physical Exam Vitals and nursing note reviewed.  Constitutional:      Appearance: Normal appearance. She is obese.  Pulmonary:     Effort: Pulmonary effort is normal.  Abdominal:     Palpations: Abdomen is soft.  Genitourinary:    Comments: Not indicated Musculoskeletal:        General: Normal range of motion.  Skin:    General: Skin is warm and dry.  Neurological:     Mental Status: She is alert and oriented to person, place, and time.  Psychiatric:        Mood and Affect: Mood normal.        Behavior: Behavior normal.        Thought Content: Thought content normal.        Judgment: Judgment normal.    FHTs by doppler: 145 bpm  MAU Course  Procedures  MDM CCUA EKG - Normal CXR 1 view  Results for orders placed or performed during the hospital encounter of 05/04/23 (from the past 24 hour(s))  Urinalysis, Routine w reflex microscopic -Urine, Clean Catch     Status: Abnormal   Collection Time: 05/04/23 11:45 AM  Result Value Ref Range   Color, Urine YELLOW YELLOW   APPearance HAZY (A) CLEAR   Specific Gravity, Urine 1.025 1.005 - 1.030   pH 6.0 5.0 - 8.0   Glucose, UA 50 (A) NEGATIVE mg/dL   Hgb urine dipstick SMALL (A) NEGATIVE   Bilirubin Urine NEGATIVE NEGATIVE   Ketones, ur 5 (A) NEGATIVE mg/dL   Protein, ur 30 (A) NEGATIVE mg/dL   Nitrite NEGATIVE NEGATIVE   Leukocytes,Ua NEGATIVE NEGATIVE   RBC / HPF 11-20 0 - 5 RBC/hpf   WBC, UA 6-10 0 - 5 WBC/hpf   Bacteria, UA RARE (A) NONE SEEN   Squamous Epithelial /  HPF 6-10 0 - 5 /HPF   Mucus  PRESENT    Ca Oxalate Crys, UA PRESENT     DG Chest Port 1 View  Result Date: 05/04/2023 CLINICAL DATA:  Palpitations and shortness of breath EXAM: PORTABLE CHEST 1 VIEW COMPARISON:  Chest x-ray 12/07/2019 FINDINGS: The heart size and mediastinal contours are within normal limits. Both lungs are clear. The visualized skeletal structures are unremarkable. IMPRESSION: No active disease. Electronically Signed   By: Darliss Cheney M.D.   On: 05/04/2023 15:18     Assessment and Plan   1. Chest pain, unspecified type   2. Anxiety   3. [redacted] weeks gestation of pregnancy   - Information provided on managing anxiety  - Advised patient that if she desires to deliver at [facility], it is best to transfer care to a [facility] provider - Information provided on [place] [facility] - Keep scheduled appt with [facility] Provider on Friday 05/08/2023 - Patient verbalized an understanding of the plan of care and agrees.    Raelyn Mora, CNM 05/04/2023, 12:30 PM

## 2023-05-04 NOTE — MAU Note (Signed)
.  Karen Booth is a 27 y.o. at [redacted]w[redacted]d here in MAU reporting: Palpitation and shortness of breath for the past two months. She reports her resting pulse today was 107 and when she stood it was 120's. She reports this occurred in her first pregnancy and she wore a heart monitor and reports they told her everything was fine. Reports mid chest pain in the car while looking for a place to park. Denies VB or LOF. +FM.  Onset of complaint: Two months Pain score: Denies current pain.  FHT: 145 doppler Lab orders placed from triage:  ED EKG, UA
